# Patient Record
Sex: Female | Born: 1940
Health system: Southern US, Community
[De-identification: ages and names within clinical notes are randomized; demographics above are authoritative.]

## PROBLEM LIST (undated history)

## (undated) DIAGNOSIS — I10 Essential (primary) hypertension: Secondary | ICD-10-CM

## (undated) DIAGNOSIS — G43009 Migraine without aura, not intractable, without status migrainosus: Secondary | ICD-10-CM

## (undated) DIAGNOSIS — M949 Disorder of cartilage, unspecified: Secondary | ICD-10-CM

## (undated) DIAGNOSIS — J309 Allergic rhinitis, unspecified: Secondary | ICD-10-CM

## (undated) DIAGNOSIS — J189 Pneumonia, unspecified organism: Secondary | ICD-10-CM

## (undated) DIAGNOSIS — M519 Unspecified thoracic, thoracolumbar and lumbosacral intervertebral disc disorder: Secondary | ICD-10-CM

## (undated) DIAGNOSIS — Z86010 Personal history of colonic polyps: Secondary | ICD-10-CM

## (undated) DIAGNOSIS — M531 Cervicobrachial syndrome: Secondary | ICD-10-CM

## (undated) DIAGNOSIS — H269 Unspecified cataract: Secondary | ICD-10-CM

## (undated) DIAGNOSIS — F329 Major depressive disorder, single episode, unspecified: Secondary | ICD-10-CM

## (undated) DIAGNOSIS — E785 Hyperlipidemia, unspecified: Secondary | ICD-10-CM

## (undated) DIAGNOSIS — I8390 Asymptomatic varicose veins of unspecified lower extremity: Secondary | ICD-10-CM

## (undated) DIAGNOSIS — D649 Anemia, unspecified: Secondary | ICD-10-CM

## (undated) DIAGNOSIS — M1990 Unspecified osteoarthritis, unspecified site: Secondary | ICD-10-CM

## (undated) DIAGNOSIS — M899 Disorder of bone, unspecified: Secondary | ICD-10-CM

## (undated) DIAGNOSIS — R413 Other amnesia: Secondary | ICD-10-CM

## (undated) DIAGNOSIS — K219 Gastro-esophageal reflux disease without esophagitis: Secondary | ICD-10-CM

## (undated) DIAGNOSIS — C801 Malignant (primary) neoplasm, unspecified: Secondary | ICD-10-CM

## (undated) DIAGNOSIS — G709 Myoneural disorder, unspecified: Secondary | ICD-10-CM

## (undated) DIAGNOSIS — M549 Dorsalgia, unspecified: Secondary | ICD-10-CM

## (undated) HISTORY — DX: Malignant (primary) neoplasm, unspecified: C80.1

## (undated) HISTORY — DX: Unspecified cataract: H26.9

## (undated) HISTORY — DX: Hyperlipidemia, unspecified: E78.5

## (undated) HISTORY — DX: Disorder of bone, unspecified: M89.9

## (undated) HISTORY — DX: Cervicobrachial syndrome: M53.1

## (undated) HISTORY — DX: Other amnesia: R41.3

## (undated) HISTORY — PX: CHOLECYSTECTOMY: SHX55

## (undated) HISTORY — DX: Unspecified osteoarthritis, unspecified site: M19.90

## (undated) HISTORY — DX: Gastro-esophageal reflux disease without esophagitis: K21.9

## (undated) HISTORY — DX: Major depressive disorder, single episode, unspecified: F32.9

## (undated) HISTORY — DX: Migraine without aura, not intractable, without status migrainosus: G43.009

## (undated) HISTORY — DX: Allergic rhinitis, unspecified: J30.9

## (undated) HISTORY — PX: TUBAL LIGATION: SHX77

## (undated) HISTORY — DX: Unspecified thoracic, thoracolumbar and lumbosacral intervertebral disc disorder: M51.9

## (undated) HISTORY — DX: Disorder of cartilage, unspecified: M94.9

## (undated) HISTORY — DX: Personal history of colonic polyps: Z86.010

## (undated) HISTORY — PX: VARICOSE VEIN SURGERY: SHX832

## (undated) HISTORY — PX: DILATION AND CURETTAGE OF UTERUS: SHX78

## (undated) HISTORY — DX: Dorsalgia, unspecified: M54.9

## (undated) HISTORY — PX: COLONOSCOPY: SHX174

## (undated) HISTORY — DX: Essential (primary) hypertension: I10

---

## 1984-07-25 HISTORY — PX: OTHER SURGICAL HISTORY: SHX169

## 1984-07-25 HISTORY — PX: LUMBAR LAMINECTOMY: SHX95

## 1986-07-25 HISTORY — PX: BREAST BIOPSY: SHX20

## 2001-03-08 ENCOUNTER — Encounter: Payer: Self-pay | Admitting: Internal Medicine

## 2001-03-28 ENCOUNTER — Encounter: Payer: Self-pay | Admitting: Internal Medicine

## 2001-04-02 ENCOUNTER — Encounter: Payer: Self-pay | Admitting: Internal Medicine

## 2001-04-12 ENCOUNTER — Encounter: Payer: Self-pay | Admitting: Internal Medicine

## 2001-05-01 ENCOUNTER — Encounter: Payer: Self-pay | Admitting: Internal Medicine

## 2004-07-25 HISTORY — PX: OVARIAN CYST REMOVAL: SHX89

## 2007-07-26 LAB — HM COLONOSCOPY: HM Colonoscopy: ABNORMAL

## 2009-04-24 LAB — CONVERTED CEMR LAB: Pap Smear: NORMAL

## 2009-08-20 ENCOUNTER — Encounter: Payer: Self-pay | Admitting: Internal Medicine

## 2009-09-29 ENCOUNTER — Encounter: Payer: Self-pay | Admitting: Internal Medicine

## 2009-12-23 LAB — HM MAMMOGRAPHY: HM Mammogram: NORMAL

## 2010-02-05 ENCOUNTER — Emergency Department (HOSPITAL_COMMUNITY)
Admission: EM | Admit: 2010-02-05 | Discharge: 2010-02-05 | Payer: Self-pay | Source: Home / Self Care | Admitting: Emergency Medicine

## 2010-02-09 ENCOUNTER — Ambulatory Visit: Payer: Self-pay | Admitting: Internal Medicine

## 2010-02-09 DIAGNOSIS — M549 Dorsalgia, unspecified: Secondary | ICD-10-CM

## 2010-02-09 DIAGNOSIS — M1990 Unspecified osteoarthritis, unspecified site: Secondary | ICD-10-CM | POA: Insufficient documentation

## 2010-02-09 DIAGNOSIS — R5383 Other fatigue: Secondary | ICD-10-CM

## 2010-02-09 DIAGNOSIS — K219 Gastro-esophageal reflux disease without esophagitis: Secondary | ICD-10-CM | POA: Insufficient documentation

## 2010-02-09 DIAGNOSIS — Z860100 Personal history of colon polyps, unspecified: Secondary | ICD-10-CM

## 2010-02-09 DIAGNOSIS — F3289 Other specified depressive episodes: Secondary | ICD-10-CM

## 2010-02-09 DIAGNOSIS — F329 Major depressive disorder, single episode, unspecified: Secondary | ICD-10-CM | POA: Insufficient documentation

## 2010-02-09 DIAGNOSIS — I10 Essential (primary) hypertension: Secondary | ICD-10-CM | POA: Insufficient documentation

## 2010-02-09 DIAGNOSIS — G43009 Migraine without aura, not intractable, without status migrainosus: Secondary | ICD-10-CM | POA: Insufficient documentation

## 2010-02-09 DIAGNOSIS — M899 Disorder of bone, unspecified: Secondary | ICD-10-CM | POA: Insufficient documentation

## 2010-02-09 DIAGNOSIS — M949 Disorder of cartilage, unspecified: Secondary | ICD-10-CM

## 2010-02-09 DIAGNOSIS — E785 Hyperlipidemia, unspecified: Secondary | ICD-10-CM

## 2010-02-09 DIAGNOSIS — R079 Chest pain, unspecified: Secondary | ICD-10-CM | POA: Insufficient documentation

## 2010-02-09 DIAGNOSIS — G8929 Other chronic pain: Secondary | ICD-10-CM | POA: Insufficient documentation

## 2010-02-09 DIAGNOSIS — R5381 Other malaise: Secondary | ICD-10-CM | POA: Insufficient documentation

## 2010-02-09 HISTORY — DX: Dorsalgia, unspecified: M54.9

## 2010-02-09 HISTORY — DX: Disorder of bone, unspecified: M89.9

## 2010-02-09 HISTORY — DX: Personal history of colonic polyps: Z86.010

## 2010-02-09 HISTORY — DX: Hyperlipidemia, unspecified: E78.5

## 2010-02-09 HISTORY — DX: Unspecified osteoarthritis, unspecified site: M19.90

## 2010-02-09 HISTORY — DX: Other specified depressive episodes: F32.89

## 2010-02-09 HISTORY — DX: Migraine without aura, not intractable, without status migrainosus: G43.009

## 2010-02-09 HISTORY — DX: Gastro-esophageal reflux disease without esophagitis: K21.9

## 2010-02-09 HISTORY — DX: Major depressive disorder, single episode, unspecified: F32.9

## 2010-02-09 HISTORY — DX: Essential (primary) hypertension: I10

## 2010-02-09 HISTORY — DX: Personal history of colon polyps, unspecified: Z86.0100

## 2010-02-09 LAB — CONVERTED CEMR LAB
ALT: 22 units/L (ref 0–35)
AST: 19 units/L (ref 0–37)
Albumin: 4.9 g/dL (ref 3.5–5.2)
Alkaline Phosphatase: 69 units/L (ref 39–117)
BUN: 15 mg/dL (ref 6–23)
Basophils Absolute: 0 10*3/uL (ref 0.0–0.1)
Basophils Relative: 0.3 % (ref 0.0–3.0)
Bilirubin Urine: NEGATIVE
Bilirubin, Direct: 0.1 mg/dL (ref 0.0–0.3)
CO2: 31 meq/L (ref 19–32)
Calcium: 9.5 mg/dL (ref 8.4–10.5)
Chloride: 102 meq/L (ref 96–112)
Cholesterol: 201 mg/dL — ABNORMAL HIGH (ref 0–200)
Creatinine, Ser: 0.7 mg/dL (ref 0.4–1.2)
Direct LDL: 122.2 mg/dL
Eosinophils Absolute: 0.1 10*3/uL (ref 0.0–0.7)
Eosinophils Relative: 1.4 % (ref 0.0–5.0)
Folate: >20 ng/mL
GFR calc non Af Amer: 85.16 mL/min (ref >60)
Glucose, Bld: 93 mg/dL (ref 70–99)
HCT: 42.8 % (ref 36.0–46.0)
HDL: 64.9 mg/dL (ref >39.00)
Hemoglobin, Urine: NEGATIVE
Hemoglobin: 14.6 g/dL (ref 12.0–15.0)
Iron: 123 ug/dL (ref 42–145)
Ketones, ur: NEGATIVE mg/dL
Leukocytes, UA: NEGATIVE
Lymphocytes Relative: 30.8 % (ref 12.0–46.0)
Lymphs Abs: 2.1 10*3/uL (ref 0.7–4.0)
MCHC: 34.1 g/dL (ref 30.0–36.0)
MCV: 92.9 fL (ref 78.0–100.0)
Monocytes Absolute: 0.5 10*3/uL (ref 0.1–1.0)
Monocytes Relative: 7.1 % (ref 3.0–12.0)
Neutro Abs: 4 10*3/uL (ref 1.4–7.7)
Neutrophils Relative %: 60.4 % (ref 43.0–77.0)
Nitrite: NEGATIVE
Platelets: 248 10*3/uL (ref 150.0–400.0)
Potassium: 3.9 meq/L (ref 3.5–5.1)
RBC: 4.61 M/uL (ref 3.87–5.11)
RDW: 13.2 % (ref 11.5–14.6)
Saturation Ratios: 32.3 % (ref 20.0–50.0)
Sed Rate: 11 mm/hr (ref 0–22)
Sodium: 139 meq/L (ref 135–145)
Specific Gravity, Urine: 1.01 (ref 1.000–1.030)
TSH: 1.75 microintl units/mL (ref 0.35–5.50)
Total Bilirubin: 0.5 mg/dL (ref 0.3–1.2)
Total CHOL/HDL Ratio: 3
Total Protein, Urine: NEGATIVE mg/dL
Total Protein: 7.4 g/dL (ref 6.0–8.3)
Transferrin: 272.3 mg/dL (ref 212.0–360.0)
Triglycerides: 120 mg/dL (ref 0.0–149.0)
Urine Glucose: NEGATIVE mg/dL
Urobilinogen, UA: 0.2 (ref 0.0–1.0)
VLDL: 24 mg/dL (ref 0.0–40.0)
Vitamin B-12: 441 pg/mL (ref 211–911)
WBC: 6.7 10*3/uL (ref 4.5–10.5)
pH: 6.5 (ref 5.0–8.0)

## 2010-02-10 LAB — CONVERTED CEMR LAB: Vit D, 25-Hydroxy: 51 ng/mL (ref 30–89)

## 2010-02-11 ENCOUNTER — Telehealth: Payer: Self-pay | Admitting: Internal Medicine

## 2010-02-15 ENCOUNTER — Telehealth: Payer: Self-pay | Admitting: Internal Medicine

## 2010-02-18 ENCOUNTER — Encounter (INDEPENDENT_AMBULATORY_CARE_PROVIDER_SITE_OTHER): Payer: Self-pay | Admitting: *Deleted

## 2010-03-22 ENCOUNTER — Ambulatory Visit: Payer: Self-pay | Admitting: Internal Medicine

## 2010-03-22 DIAGNOSIS — L989 Disorder of the skin and subcutaneous tissue, unspecified: Secondary | ICD-10-CM | POA: Insufficient documentation

## 2010-04-06 ENCOUNTER — Encounter
Admission: RE | Admit: 2010-04-06 | Discharge: 2010-07-05 | Payer: Self-pay | Source: Home / Self Care | Attending: Physical Medicine & Rehabilitation | Admitting: Physical Medicine & Rehabilitation

## 2010-04-09 ENCOUNTER — Ambulatory Visit: Payer: Self-pay | Admitting: Physical Medicine & Rehabilitation

## 2010-04-26 ENCOUNTER — Ambulatory Visit: Payer: Self-pay | Admitting: Internal Medicine

## 2010-04-26 ENCOUNTER — Ambulatory Visit: Payer: Self-pay | Admitting: Physical Medicine & Rehabilitation

## 2010-04-26 DIAGNOSIS — R197 Diarrhea, unspecified: Secondary | ICD-10-CM | POA: Insufficient documentation

## 2010-04-26 LAB — CONVERTED CEMR LAB
BUN: 7 mg/dL (ref 6–23)
Basophils Absolute: 0 10*3/uL (ref 0.0–0.1)
Basophils Relative: 0.3 % (ref 0.0–3.0)
CO2: 31 meq/L (ref 19–32)
Calcium: 9.8 mg/dL (ref 8.4–10.5)
Chloride: 94 meq/L — ABNORMAL LOW (ref 96–112)
Creatinine, Ser: 0.8 mg/dL (ref 0.4–1.2)
Eosinophils Absolute: 0.1 10*3/uL (ref 0.0–0.7)
Eosinophils Relative: 1.5 % (ref 0.0–5.0)
GFR calc non Af Amer: 73.24 mL/min (ref >60)
Glucose, Bld: 107 mg/dL — ABNORMAL HIGH (ref 70–99)
HCT: 43.1 % (ref 36.0–46.0)
Hemoglobin: 15.1 g/dL — ABNORMAL HIGH (ref 12.0–15.0)
Lymphocytes Relative: 25.3 % (ref 12.0–46.0)
Lymphs Abs: 1.5 10*3/uL (ref 0.7–4.0)
MCHC: 35 g/dL (ref 30.0–36.0)
MCV: 93.2 fL (ref 78.0–100.0)
Monocytes Absolute: 0.5 10*3/uL (ref 0.1–1.0)
Monocytes Relative: 8.1 % (ref 3.0–12.0)
Neutro Abs: 3.8 10*3/uL (ref 1.4–7.7)
Neutrophils Relative %: 64.8 % (ref 43.0–77.0)
Platelets: 258 10*3/uL (ref 150.0–400.0)
Potassium: 3.2 meq/L — ABNORMAL LOW (ref 3.5–5.1)
RBC: 4.62 M/uL (ref 3.87–5.11)
RDW: 13.5 % (ref 11.5–14.6)
Sodium: 134 meq/L — ABNORMAL LOW (ref 135–145)
WBC: 5.9 10*3/uL (ref 4.5–10.5)

## 2010-05-04 ENCOUNTER — Telehealth: Payer: Self-pay | Admitting: Internal Medicine

## 2010-05-04 ENCOUNTER — Encounter: Payer: Self-pay | Admitting: Internal Medicine

## 2010-05-07 ENCOUNTER — Ambulatory Visit: Payer: Self-pay | Admitting: Gastroenterology

## 2010-05-07 LAB — CONVERTED CEMR LAB
ALT: 20 units/L (ref 0–35)
AST: 20 units/L (ref 0–37)
Albumin: 4.1 g/dL (ref 3.5–5.2)
Alkaline Phosphatase: 64 units/L (ref 39–117)
BUN: 10 mg/dL (ref 6–23)
Basophils Absolute: 0 10*3/uL (ref 0.0–0.1)
Basophils Relative: 0.3 % (ref 0.0–3.0)
CO2: 30 meq/L (ref 19–32)
Calcium: 9.5 mg/dL (ref 8.4–10.5)
Chloride: 103 meq/L (ref 96–112)
Creatinine, Ser: 0.8 mg/dL (ref 0.4–1.2)
Eosinophils Absolute: 0.1 10*3/uL (ref 0.0–0.7)
Eosinophils Relative: 1.3 % (ref 0.0–5.0)
GFR calc non Af Amer: 78.75 mL/min (ref >60)
Glucose, Bld: 89 mg/dL (ref 70–99)
HCT: 40.4 % (ref 36.0–46.0)
Hemoglobin: 13.7 g/dL (ref 12.0–15.0)
IgA: 202 mg/dL (ref 68–378)
Lymphocytes Relative: 19.6 % (ref 12.0–46.0)
Lymphs Abs: 1.1 10*3/uL (ref 0.7–4.0)
MCHC: 33.9 g/dL (ref 30.0–36.0)
MCV: 95 fL (ref 78.0–100.0)
Monocytes Absolute: 0.4 10*3/uL (ref 0.1–1.0)
Monocytes Relative: 7 % (ref 3.0–12.0)
Neutro Abs: 4.1 10*3/uL (ref 1.4–7.7)
Neutrophils Relative %: 71.8 % (ref 43.0–77.0)
Platelets: 240 10*3/uL (ref 150.0–400.0)
Potassium: 4.2 meq/L (ref 3.5–5.1)
RBC: 4.25 M/uL (ref 3.87–5.11)
RDW: 13.8 % (ref 11.5–14.6)
Sed Rate: 12 mm/hr (ref 0–22)
Sodium: 138 meq/L (ref 135–145)
TSH: 1.23 microintl units/mL (ref 0.35–5.50)
Tissue Transglutaminase Ab, IgA: 13.7 units (ref <20)
Total Bilirubin: 0.5 mg/dL (ref 0.3–1.2)
Total Protein: 6.5 g/dL (ref 6.0–8.3)
WBC: 5.7 10*3/uL (ref 4.5–10.5)

## 2010-05-10 ENCOUNTER — Encounter: Payer: Self-pay | Admitting: Gastroenterology

## 2010-05-11 ENCOUNTER — Encounter: Payer: Self-pay | Admitting: Gastroenterology

## 2010-05-11 ENCOUNTER — Encounter: Payer: Self-pay | Admitting: Internal Medicine

## 2010-05-17 ENCOUNTER — Encounter (INDEPENDENT_AMBULATORY_CARE_PROVIDER_SITE_OTHER): Payer: Self-pay | Admitting: *Deleted

## 2010-06-01 ENCOUNTER — Ambulatory Visit: Payer: Self-pay | Admitting: Physical Medicine & Rehabilitation

## 2010-06-03 ENCOUNTER — Encounter: Payer: Self-pay | Admitting: Internal Medicine

## 2010-06-09 ENCOUNTER — Ambulatory Visit: Payer: Self-pay | Admitting: Internal Medicine

## 2010-06-09 DIAGNOSIS — M531 Cervicobrachial syndrome: Secondary | ICD-10-CM | POA: Insufficient documentation

## 2010-06-09 HISTORY — DX: Cervicobrachial syndrome: M53.1

## 2010-06-15 ENCOUNTER — Telehealth (INDEPENDENT_AMBULATORY_CARE_PROVIDER_SITE_OTHER): Payer: Self-pay | Admitting: *Deleted

## 2010-06-16 ENCOUNTER — Encounter: Payer: Self-pay | Admitting: Internal Medicine

## 2010-06-21 ENCOUNTER — Ambulatory Visit: Payer: Self-pay | Admitting: Gastroenterology

## 2010-08-24 NOTE — Consult Note (Signed)
Summary: [facility]   Imported By: Sherian Rein 05/21/2010 14:43:37  _____________________________________________________________________  External Attachment:    Type:   Image     Comment:   External Document

## 2010-08-24 NOTE — Assessment & Plan Note (Signed)
Summary: NEW MEDICARE PT--POST ER: 02/05/10--MUSCLE SPASM-#PKG/OFF--STC   Vital Signs:  Patient profile:   70 year old female Height:      65 inches Weight:      160.25 pounds BMI:     26.76 O2 Sat:      94 % on Room air Temp:     97.8 degrees F oral Pulse rate:   86 / minute BP sitting:   142 / 84  (left arm) Cuff size:   regular  Vitals Entered By: Zella Ball Ewing CMA Duncan Dull) (February 09, 2010 1:14 PM)  O2 Flow:  Room air  Preventive Care Screening  Colonoscopy:    Date:  07/26/2007    Next Due:  07/2017    Results:  abnormal   Mammogram:    Date:  12/23/2009    Results:  normal   Pap Smear:    Date:  04/24/2009    Results:  normal   Bone Density:    Date:  04/24/2008    Results:  abnormal std dev  Last Pneumovax:    Date:  07/25/2005    Results:  given   Last Tetanus Booster:    Date:  01/22/2002    Results:  Historical   CC: New Patient, New Medicare/RE   CC:  New Patient and New Medicare/RE.  History of Present Illness: here to f/u after being seen in the ER july 15 with chest pain;  has recurrent MSK muscle spasms in the chest for years;  had taken her muscle relaxer but did not help and went to ER - ECG ok,  tx medically and asked to find primary MD locally,  just moved here june 13 form [place].  2 children live here .  No further pain since then ; Pt denies CP, sob, doe, wheezing, orthopnea, pnd, worsening LE edema, palps, dizziness or syncope  .Pt denies new neuro symptoms such as headache, facial or extremity weakness  Last stress test approx 4 to 5 yrs ago.   Here for wellness Diet: Heart Healthy or DM if diabetic Physical Activities: swims several times per wk Depression/mood screen: mild to mod, no suicidal ideatin Hearing: Intact bilateral Visual Acuity: Grossly normal, gets exam yearly ADL's: Capable  Fall Risk: None Home Safety: Good Cognitive Impairment:  Gen appearance, affect, speech, memory, attention & motor skills grossly  intact End-of-Life Planning: Advance directive - Full code/I agree   Preventive Screening-Counseling & Management  Alcohol-Tobacco     Smoking Status: quit      Drug Use:  no.    Problems Prior to Update: 1)  Fatigue  (ICD-780.79) 2)  Osteopenia  (ICD-733.90) 3)  Common Migraine  (ICD-346.10) 4)  Colonic Polyps, Hx of  (ICD-V12.72) 5)  Hyperlipidemia  (ICD-272.4) 6)  Gerd  (ICD-530.81) 7)  Depression  (ICD-311) 8)  Degenerative Joint Disease  (ICD-715.90) 9)  Hypertension  (ICD-401.9) 10)  Back Pain, Chronic  (ICD-724.5)  Medications Prior to Update: 1)  None  Current Medications (verified): 1)  Nortriptyline Hcl 25 Mg Caps (Nortriptyline Hcl) .Marland Kitchen.. 1 By Mouth At Bedtime 2)  Calcium 500 Mg Tabs (Calcium) .Marland Kitchen.. 1 By Mouth Two Times A Day 3)  Glucosamine-Chondroitin 500-400 Mg Caps (Glucosamine-Chondroitin) .Marland Kitchen.. 1 By Mouth Two Times A Day 4)  Hydrochlorothiazide 12.5 Mg Caps (Hydrochlorothiazide) .Marland Kitchen.. 1 By Mouth Once Daily 5)  Simvastatin 20 Mg Tabs (Simvastatin) .Marland Kitchen.. 1 By Mouth Once Daily 6)  Carisoprodol 350 Mg Tabs (Carisoprodol) .Marland Kitchen.. 1 By Mouth Two Times A Day As  Needed 7)  Cymbalta 60 Mg Cpep (Duloxetine Hcl) .Marland Kitchen.. 1po Once Daily  Allergies (verified): 1)  ! Pcn 2)  ! 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[employer], also some bookkeeping Married 3 children Former Smoker - started 17yo to 1971 Alcohol use-yes Drug use-no  Risk Factors: Smoking Status: quit (02/09/2010)  Past Medical History: chronic back pain/chronic nerve pain bilat feet since prior to 2002 - neg EMG/NCS Hypertension Depression GERD recurrent headaches/migraine  Hyperlipidemia Colonic polyps, hx of lumbar disc disease MD roster:  neurology - Dr Maryella Shivers  [place]                   GYN - Dr Lanell Matar -  [place] Osteopenia  Past Surgical History: s/p chymopapain injection lumbar 1986 Tubal ligation s/p lumbar laminectomy 1986 s/p ovary cyst 2006 s/p varicose vein removed 2008 Cholecystectomy s/p breast biopsy - neg - 1988  Family History: Reviewed history and no changes required. mother with lung cancer father with DM grandmother with breast cancer  Social History: Reviewed history and no changes required. 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         all otherwise negative per pt -  except for mild ongoing fatigue, on osa symtpoms  Physical Exam  General:  alert and well-developed.   Head:  normocephalic and atraumatic.   Eyes:  vision grossly intact, pupils equal, and pupils round.   Ears:  R ear normal and L ear normal.   Nose:  no external deformity and no nasal discharge.   Mouth:  no gingival abnormalities and pharynx pink and moist.   Neck:  supple and no masses.   Lungs:  normal respiratory effort and normal breath sounds.   Heart:  normal rate and regular rhythm.   Abdomen:  soft, non-tender, and normal bowel sounds.   Msk:  no joint tenderness and no joint swelling.   Extremities:  no edema, no erythema  Neurologic:  cranial nerves II-XII intact and strength normal in all extremities.   Skin:  color normal and no rashes.   Psych:  not depressed appearing and slightly  anxious.     Impression & Recommendations:  Problem # 1:  Preventive Health Care (ICD-V70.0)  Overall doing well, age appropriate education and counseling updated and referral for appropriate preventive services done unless declined, immunizations up to date or declined, diet counseling done if overweight, urged to quit smoking if smokes , most recent labs reviewed and current ordered if appropriate, ecg reviewed or declined (interpretation per ECG scanned in the EMR if done); information regarding Medicare Prevention requirements given if appropriate; speciality referrals updated as appropriate   Orders: First annual wellness visit with prevention plan  (O1308)  Problem # 2:  HYPERLIPIDEMIA (ICD-272.4)  Her updated medication list for this problem includes:    Simvastatin 20 Mg Tabs (Simvastatin) .Marland KitchenMarland KitchenMarland KitchenMarland Kitchen 1  by mouth once daily Continue all previous medications as before this visit,  Pt to continue diet efforts, good med tolerance; to check labs - goal LDL less than 70   Orders: Prescription Created Electronically [phone])  Problem # 3:  COMMON MIGRAINE (ICD-346.10) last flare was jan 2011 - improved after neuro "shots to the back of the head"  - asympt, ok to follow  Problem # 4:  GERD (ICD-530.81) asympt - no tx at this time  Problem # 5:  DEPRESSION (ICD-311)  Her updated medication list for this problem includes:    Nortriptyline Hcl 25 Mg Caps (Nortriptyline hcl) .Marland Kitchen... 1 by mouth at bedtime    Cymbalta 60 Mg Cpep (Duloxetine hcl) .Marland Kitchen... 1po once daily mild ongoing, ok to start  meds/tx- and will try cymbalta trial  Problem # 6:  HYPERTENSION (ICD-401.9)  Her updated medication list for this problem includes:    Hydrochlorothiazide 12.5 Mg Caps (Hydrochlorothiazide) .Marland Kitchen... 1 by mouth once daily  BP today: 142/84 mild elev today, likely situational, ok to follow, continue same treatment - K at home per pt  Orders: TLB-Lipid Panel (80061-LIPID)  Problem # 7:  FATIGUE  (ICD-780.79)  exam benign, to check labs below; follow with expectant management   Orders: T-Vitamin D (25-Hydroxy) (30865-78469) TLB-BMP (Basic Metabolic Panel-BMET) (80048-METABOL) TLB-CBC Platelet - w/Differential (85025-CBCD) TLB-Hepatic/Liver Function Pnl (80076-HEPATIC) TLB-TSH (Thyroid Stimulating Hormone) (84443-TSH) TLB-Sedimentation Rate (ESR) (85652-ESR) TLB-IBC Pnl (Iron/FE;Transferrin) (83550-IBC) TLB-B12 + Folate Pnl (62952_84132-G40/NUU) TLB-Udip ONLY (81003-UDIP)  Complete Medication List: 1)  Nortriptyline Hcl 25 Mg Caps (Nortriptyline hcl) .Marland Kitchen.. 1 by mouth at bedtime 2)  Calcium 500 Mg Tabs (Calcium) .Marland Kitchen.. 1 by mouth two times a day 3)  Glucosamine-chondroitin 500-400 Mg Caps (Glucosamine-chondroitin) .Marland Kitchen.. 1 by mouth two times a day 4)  Hydrochlorothiazide 12.5 Mg Caps (Hydrochlorothiazide) .Marland Kitchen.. 1 by mouth once daily 5)  Simvastatin 20 Mg Tabs (Simvastatin) .Marland Kitchen.. 1 by mouth once daily 6)  Carisoprodol 350 Mg Tabs (Carisoprodol) .Marland Kitchen.. 1 by mouth two times a day as needed 7)  Cymbalta 60 Mg Cpep (Duloxetine hcl) .Marland Kitchen.. 1po once daily  Patient Instructions: 1)  Please take all new medications as prescribed  - the cymbalta is started at 30 mg per day for one wk, then 60 mg per day after that 2)  Continue all previous medications as before this visit , except aft er3 to 4 wks, you may be able to stop the nortryptilene 3)  Please go to the Lab in the basement for your blood and/or urine tests today 4)  Please schedule a follow-up appointment in 6 months or sooner if needed Prescriptions: CYMBALTA 60 MG CPEP (DULOXETINE HCL) 1po once daily  #30 x 11   Entered and Authorized by:   Corwin Levins MD   Signed by:   Corwin Levins MD on 02/09/2010   Method used:   Print then Give to Patient   RxID:   [id number] CARISOPRODOL 350 MG TABS (CARISOPRODOL) 1 by mouth two times a day as needed  #60 x 5   Entered and Authorized by:   Corwin Levins MD   Signed by:   Corwin Levins MD on  02/09/2010   Method used:   Print then Give to Patient   RxID:   [id number] SIMVASTATIN 20 MG TABS (SIMVASTATIN) 1 by mouth once daily  #90 x 3   Entered and Authorized by:   Corwin Levins MD   Signed by:   Corwin Levins MD on  02/09/2010   Method used:   Print then Give to Patient   RxID:   [id number] HYDROCHLOROTHIAZIDE 12.5 MG CAPS (HYDROCHLOROTHIAZIDE) 1 by mouth once daily  #90 x 3   Entered and Authorized by:   Corwin Levins MD   Signed by:   Corwin Levins MD on 02/09/2010   Method used:   Print then Give to Patient   RxID:   [id number] NORTRIPTYLINE HCL 25 MG CAPS (NORTRIPTYLINE HCL) 1 by mouth at bedtime  #90 x 3   Entered and Authorized by:   Corwin Levins MD   Signed by:   Corwin Levins MD on 02/09/2010   Method used:   Print then Give to Patient   RxID:   [id number]    Immunization History:  Tetanus/Td Immunization History:    Tetanus/Td:  historical (01/22/2002)

## 2010-08-24 NOTE — Medication Information (Signed)
Summary: Approved/[facility]  Approved/[facility]   Imported By: Lester Alapaha 06/24/2010 07:55:27  _____________________________________________________________________  External Attachment:    Type:   Image     Comment:   External Document

## 2010-08-24 NOTE — Assessment & Plan Note (Signed)
  Review of gastrointestinal problems: 1. Acute diarrheal illness, likely infectious.  Symptoms improved over about 4 weeks. October 2011. CBC, complete metabolic profile, celiac sprue testing, sedimentation rate all negative. Stool tests for usual infectious etiologies were all negative. Colonoscopy out of state in [place] 2-3 years prior was normal per patient    History of Present Illness Visit Type: Follow-up Visit Primary GI MD: Rob Bunting MD Primary Provider: Oliver Barre, MD Requesting Provider: na Chief Complaint: F/u visit  History of Present Illness:     very pleasant 70 year old woman whom I last saw about 6 weeks ago.  took about 4 weeks to settle down loose stools. Past 2 weeks much more normal.  Still slightly loose, but urgency is gone.  She has been taking immodium sporadically (has taken 2 immodium in past 2 weeks).  No bleeding.  Abd feels fine.             Current Medications (verified): 1)  Calcium 500 Mg Tabs (Calcium) .Marland Kitchen.. 1 By Mouth Two Times A Day 2)  Glucosamine-Chondroitin 500-400 Mg Caps (Glucosamine-Chondroitin) .Marland Kitchen.. 1 By Mouth Two Times A Day 3)  Hydrochlorothiazide 12.5 Mg Caps (Hydrochlorothiazide) .Marland Kitchen.. 1 By Mouth Once Daily 4)  Simvastatin 20 Mg Tabs (Simvastatin) .Marland Kitchen.. 1 By Mouth Once Daily 5)  Carisoprodol 350 Mg Tabs (Carisoprodol) .Marland Kitchen.. 1 By Mouth Two Times A Day As Needed 6)  Cymbalta 60 Mg Cpep (Duloxetine Hcl) .Marland Kitchen.. 1po Once Daily 7)  Butrans 5 Mcg/hr Ptwk (Buprenorphine) .Marland Kitchen.. 1 Patch Use Asd Per Wk(Not Started Yet) 8)  Tylenol 325 Mg Tabs (Acetaminophen) .... As Needed  Allergies (verified): 1)  ! Pcn 2)  ! 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 Stool testing and blood tests were all negative, see above. Her symptoms improved gradually over 4-5 weeks and are now nearly normal. I see no reason for any further testing unless her symptoms recur. We will try again to get her previous colonoscopy report from [place] sent over.  Patient Instructions: 1)  Reintroduce dairy products into your diet slowly. 2)  We will try again to get records from your previous colonoscopy [place], [place]). 3)  Call if diarrhea returns. 4)  A copy of this information will be sent to Dr. Jonny Ruiz. 5)  The medication list was reviewed and reconciled.  All changed / newly prescribed medications were explained.  A complete medication list was provided to the patient / caregiver.

## 2010-08-24 NOTE — Letter (Signed)
Summary: [facility]   Imported By: Lennie Odor 06/08/2010 13:20:24  _____________________________________________________________________  External Attachment:    Type:   Image     Comment:   External Document

## 2010-08-24 NOTE — Assessment & Plan Note (Signed)
Summary: HEADACHES/NWS  #   Vital Signs:  Patient profile:   70 year old female Height:      65 inches Weight:      154 pounds BMI:     25.72 O2 Sat:      94 % on Room air Temp:     98.3 degrees F oral Pulse rate:   76 / minute BP sitting:   132 / 80  (left arm) Cuff size:   regular  Vitals Entered By: Zella Ball Ewing CMA Duncan Dull) (June 09, 2010 11:26 AM)  O2 Flow:  Room air  CC: Headaches, Flu shot/RE   Primary Care Provider:  Oliver Barre, MD  CC:  Headaches and Flu shot/RE.  History of Present Illness: here with c/o pain c/w her  right occipital neuralgia dx per neurology 2 wks ago, s/p failed nov 1 cortisone per pt; whereas the prior 2 shots did seem to help; no other med tx other than this;  not o/w seeing pain clinic,  pain increased in the last wk, hard to sleep;  now 7/10, constant, no radiation, not amenalbe to OTC meds such as tylenol, and not particularly aggrevated by anything, including neck flexion.  No other UE or LE pain/weak/numb, no bowel or bladder change, gait change or fall or injury.  No fever, wt loss, Pt denies CP, worsening sob, doe, wheezing, orthopnea, pnd, worsening LE edema, palps, dizziness or syncope  Pt denies other new neuro symptoms such as headache, facial or extremity weakness  .  Pt denies polydipsia, polyuria.  Overall good compliance with meds, and good tolerability. , including the cymbalta.  Denies worsening depressive symptoms, suicidal ideation, or panic.    Problems Prior to Update: 1)  Diarrhea  (ICD-787.91) 2)  Diarrhea, Acute  (ICD-787.91) 3)  Skin Lesion  (ICD-709.9) 4)  Preventive Health Care  (ICD-V70.0) 5)  Fatigue  (ICD-780.79) 6)  Osteopenia  (ICD-733.90) 7)  Common Migraine  (ICD-346.10) 8)  Colonic Polyps, Hx of  (ICD-V12.72) 9)  Hyperlipidemia  (ICD-272.4) 10)  Gerd  (ICD-530.81) 11)  Depression  (ICD-311) 12)  Degenerative Joint Disease  (ICD-715.90) 13)  Hypertension  (ICD-401.9) 14)  Back Pain, Chronic   (ICD-724.5)  Medications Prior to Update: 1)  Calcium 500 Mg Tabs (Calcium) .Marland Kitchen.. 1 By Mouth Two Times A Day 2)  Glucosamine-Chondroitin 500-400 Mg Caps (Glucosamine-Chondroitin) .Marland Kitchen.. 1 By Mouth Two Times A Day 3)  Hydrochlorothiazide 12.5 Mg Caps (Hydrochlorothiazide) .Marland Kitchen.. 1 By Mouth Once Daily 4)  Simvastatin 20 Mg Tabs (Simvastatin) .Marland Kitchen.. 1 By Mouth Once Daily 5)  Carisoprodol 350 Mg Tabs (Carisoprodol) .Marland Kitchen.. 1 By Mouth Two Times A Day As Needed 6)  Cymbalta 60 Mg Cpep (Duloxetine Hcl) .Marland Kitchen.. 1po Once Daily  Current Medications (verified): 1)  Calcium 500 Mg Tabs (Calcium) .Marland Kitchen.. 1 By Mouth Two Times A Day 2)  Glucosamine-Chondroitin 500-400 Mg Caps (Glucosamine-Chondroitin) .Marland Kitchen.. 1 By Mouth Two Times A Day 3)  Hydrochlorothiazide 12.5 Mg Caps (Hydrochlorothiazide) .Marland Kitchen.. 1 By Mouth Once Daily 4)  Simvastatin 20 Mg Tabs (Simvastatin) .Marland Kitchen.. 1 By Mouth Once Daily 5)  Carisoprodol 350 Mg Tabs (Carisoprodol) .Marland Kitchen.. 1 By Mouth Two Times A Day As Needed 6)  Cymbalta 60 Mg Cpep (Duloxetine Hcl) .Marland Kitchen.. 1po Once Daily 7)  Butrans 5 Mcg/hr Ptwk (Buprenorphine) .Marland Kitchen.. 1 Patch Use Asd Per Wk  Allergies (verified): 1)  ! Pcn 2)  ! Sulfa  Past History:  Past Medical History: Last updated: 05/07/2010 chronic back pain/chronic nerve pain bilat feet since prior  to 2002 - neg EMG/NCS Hypertension Depression GERD recurrent headaches/migraine  Hyperlipidemia Colonic polyps, ( unclear pathology, procedures done in [place]) lumbar disc disease MD roster:  neurology - Dr Maryella Shivers  - [place]                   GYN - Dr Lanell Matar - [place] Osteopenia     Past Surgical History: Last updated: 05/07/2010 s/p chymopapain injection lumbar 1986 Tubal ligation s/p lumbar laminectomy 1986 s/p ovary cyst 2006 s/p varicose vein removed 2008 Cholecystectomy s/p breast biopsy - neg - 1988   Social History: Last updated: 05/07/2010 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   Head:  normocephalic and atraumatic.   Eyes:  vision grossly intact, pupils equal, and pupils round.   Ears:  R ear normal and L ear normal.   Nose:  no external deformity and no nasal discharge.   Mouth:  no gingival abnormalities and pharynx pink and moist.   Neck:  supple and no masses. , FROM, NT Lungs:  normal respiratory effort and normal breath sounds.   Heart:  normal rate and regular rhythm.   Msk:  no joint tenderness and no joint swelling.   Extremities:  no edema, no erythema  Neurologic:  strength normal in all extremities and gait normal.   Psych:  not anxious appearing and not depressed appearing.     Impression & Recommendations:  Problem # 1:  OCCIPITAL NEURALGIA (ICD-723.8) for butrans tx for uncontrolled pain, and f/u for next cortisone shot and neurology as planned  Problem # 2:  HYPERTENSION (ICD-401.9)  Her updated medication list for this problem includes:    Hydrochlorothiazide 12.5 Mg Caps (Hydrochlorothiazide) .Marland Kitchen... 1 by mouth once daily  BP today: 132/80 Prior BP: 126/78 (05/07/2010)  Labs Reviewed: K+: 4.2 (05/07/2010) Creat: : 0.8 (05/07/2010)   Chol: 201 (02/09/2010)   HDL: 64.90 (02/09/2010)   TG: 120.0 (02/09/2010) stable overall by hx and exam, ok to continue meds/tx as is   Problem # 3:  DEPRESSION (ICD-311)  Her updated medication list for this problem includes:    Cymbalta 60 Mg Cpep (Duloxetine hcl) .Marland Kitchen... 1po once daily stable overall by hx and exam, ok to continue meds/tx as is   Complete Medication List: 1)  Calcium 500 Mg Tabs (Calcium) .Marland Kitchen.. 1 by mouth two times a day 2)  Glucosamine-chondroitin 500-400 Mg Caps (Glucosamine-chondroitin) .Marland Kitchen.. 1 by mouth two times a day 3)   Hydrochlorothiazide 12.5 Mg Caps (Hydrochlorothiazide) .Marland Kitchen.. 1 by mouth once daily 4)  Simvastatin 20 Mg Tabs (Simvastatin) .Marland Kitchen.. 1 by mouth once daily 5)  Carisoprodol 350 Mg Tabs (Carisoprodol) .Marland Kitchen.. 1 by mouth two times a day as needed 6)  Cymbalta 60 Mg Cpep (Duloxetine hcl) .Marland Kitchen.. 1po once daily 7)  Butrans 5 Mcg/hr Ptwk (Buprenorphine) .Marland Kitchen.. 1 patch use asd per wk  Other Orders: Flu Vaccine 33yrs + MEDICARE PATIENTS (C3762) Administration Flu vaccine - MCR (G3151)  Patient Instructions: 1)  you had the flu shot today 2)  Please take all new medications as prescribed  3)  Continue all previous medications as before this visit 4)  Please keep your appt for the future  cortisone shot 5)  Please schedule a follow-up appointment in 6 months or as needed Prescriptions: BUTRANS 5 MCG/HR PTWK (BUPRENORPHINE) 1 patch use asd per wk  #4 x 1   Entered and Authorized by:   Corwin Levins MD   Signed by:   Corwin Levins MD on 06/09/2010   Method used:   Print then Give to Patient   RxID:   [id number]    Orders Added: 1)  Flu Vaccine 46yrs + MEDICARE PATIENTS [Q2039] 2)  Administration Flu vaccine - MCR [G0008] 3)  Est. Patient Level IV [14782]   Flu Vaccine Consent Questions     Do you have a history of severe allergic reactions to this vaccine? no    Any prior history of allergic reactions to egg and/or gelatin? no    Do you have a sensitivity to the preservative Thimersol? no    Do you have a past history of Guillan-Barre Syndrome? no    Do you currently have an acute febrile illness? no    Have you ever had a severe reaction to latex? no    Vaccine information given and explained to patient? yes    Are you currently pregnant? no    Lot Number:[id number]   Exp Date:01/22/2011   Site Given  Left Deltoid IMdflu1 .lnmedflu1

## 2010-08-24 NOTE — Letter (Signed)
Summary: [facility]  [facility]   Imported By: Sherian Rein 02/10/2010 14:22:33  _____________________________________________________________________  External Attachment:    Type:   Image     Comment:   External Document

## 2010-08-24 NOTE — Progress Notes (Signed)
Summary: Referral  Phone Note Call from Patient Call back at Home Phone [phone]   Caller: Patient Summary of Call: Pt called stating that she still has diarrhea. Pt is requesting referral to GI. Initial call taken by: Margaret Pyle, CMA,  May 04, 2010 11:08 AM  Follow-up for Phone Call        ok for referral  Follow-up by: Corwin Levins MD,  May 04, 2010 1:14 PM  Additional Follow-up for Phone Call Additional follow up Details #1::        Pt's spouse informed Additional Follow-up by: Margaret Pyle, CMA,  May 04, 2010 1:39 PM  New Problems: DIARRHEA (ICD-787.91)   New Problems: DIARRHEA (ICD-787.91)

## 2010-08-24 NOTE — Letter (Signed)
Summary: Appt Reminder 2  [facility]  [address] [place], [place] [address]   Phone: [phone]  Fax: [phone]        May 17, 2010 MRN: [id number]    [name] [address] [place], [place]  [address]    Dear Ms. Rueth,   You have a return appointment with Dr. Christella Hartigan on 06/21/10 at 3:45 pm.  Please remember to bring a complete list of the medicines you are taking, your insurance card and your co-pay.  If you have to cancel or reschedule this appointment, please call before 5:00 pm the evening before to avoid a cancellation fee.  If you have any questions or concerns, please call [phone].    Sincerely,    Chales Abrahams CMA (AAMA)  Appended Document: Appt Reminder 2 letter mailed

## 2010-08-24 NOTE — Progress Notes (Signed)
Summary: PA Butrans  Phone Note From Pharmacy   Summary of Call: PA [facility] called [employer] [phone], form sent to Dr Lottie Mussel  June 15, 2010 3:19 PM PA approved through 06/17/11, pt aware . Initial call taken by: Dagoberto Reef,  June 22, 2010 8:43 AM

## 2010-08-24 NOTE — Letter (Signed)
Summary: Maple Mirza MD  Maple Mirza MD   Imported By: Sherian Rein 02/10/2010 14:17:48  _____________________________________________________________________  External Attachment:    Type:   Image     Comment:   External Document

## 2010-08-24 NOTE — Assessment & Plan Note (Signed)
Summary: diarrhea/cd   Vital Signs:  Patient profile:   70 year old female Height:      65 inches Weight:      156.50 pounds BMI:     26.14 O2 Sat:      93 % on Room air Temp:     99 degrees F oral Pulse rate:   90 / minute BP sitting:   124 / 82  (left arm) Cuff size:   regular  Vitals Entered By: Zella Ball Ewing CMA (AAMA) (April 26, 2010 10:45 AM)  O2 Flow:  Room air CC: Diarrhea for 6 days/RE   CC:  Diarrhea for 6 days/RE.  History of Present Illness: here with acute onset x 6 days loose stool, occasionally watery, without fever, abd pain, n/v, blood.  Thinks she has lost about 5 lbs,  and freq and severity may be starting to improve.  Immodium helps somewhat, but not as well as needed.  Has not been able to leave the house for excercise in the past few days. No chills.  No sick contacts or prior hx recent.  No recent antibx.  Similar episode occurred about 10 yrs ago, resolved on its own.  Has some dry thraot, but thinks this is chronic due to  her meds, adn denies orthostasis.  Pt denies CP, worsening sob, doe, wheezing, orthopnea, pnd, worsening LE edema, palps, dizziness or syncope  Pt denies new neuro symptoms such as headache, facial or extremity weakness  Appettit down as well, eating OK the BRAT diet.   Problems Prior to Update: 1)  Diarrhea, Acute  (ICD-787.91) 2)  Skin Lesion  (ICD-709.9) 3)  Preventive Health Care  (ICD-V70.0) 4)  Fatigue  (ICD-780.79) 5)  Osteopenia  (ICD-733.90) 6)  Common Migraine  (ICD-346.10) 7)  Colonic Polyps, Hx of  (ICD-V12.72) 8)  Hyperlipidemia  (ICD-272.4) 9)  Gerd  (ICD-530.81) 10)  Depression  (ICD-311) 11)  Degenerative Joint Disease  (ICD-715.90) 12)  Hypertension  (ICD-401.9) 13)  Back Pain, Chronic  (ICD-724.5)  Medications Prior to Update: 1)  Nortriptyline Hcl 25 Mg Caps (Nortriptyline Hcl) .Marland Kitchen.. 1 By Mouth At Bedtime 2)  Calcium 500 Mg Tabs (Calcium) .Marland Kitchen.. 1 By Mouth Two Times A Day 3)  Glucosamine-Chondroitin 500-400 Mg  Caps (Glucosamine-Chondroitin) .Marland Kitchen.. 1 By Mouth Two Times A Day 4)  Hydrochlorothiazide 12.5 Mg Caps (Hydrochlorothiazide) .Marland Kitchen.. 1 By Mouth Once Daily 5)  Simvastatin 20 Mg Tabs (Simvastatin) .Marland Kitchen.. 1 By Mouth Once Daily 6)  Carisoprodol 350 Mg Tabs (Carisoprodol) .Marland Kitchen.. 1 By Mouth Two Times A Day As Needed 7)  Cymbalta 60 Mg Cpep (Duloxetine Hcl) .Marland Kitchen.. 1po Once Daily 8)  Triamcinolone Acetonide 0.1 % Crea (Triamcinolone Acetonide) .... Use Asd Two Times A Day As Needed  Current Medications (verified): 1)  Nortriptyline Hcl 25 Mg Caps (Nortriptyline Hcl) .Marland Kitchen.. 1 By Mouth At Bedtime 2)  Calcium 500 Mg Tabs (Calcium) .Marland Kitchen.. 1 By Mouth Two Times A Day 3)  Glucosamine-Chondroitin 500-400 Mg Caps (Glucosamine-Chondroitin) .Marland Kitchen.. 1 By Mouth Two Times A Day 4)  Hydrochlorothiazide 12.5 Mg Caps (Hydrochlorothiazide) .Marland Kitchen.. 1 By Mouth Once Daily 5)  Simvastatin 20 Mg Tabs (Simvastatin) .Marland Kitchen.. 1 By Mouth Once Daily 6)  Carisoprodol 350 Mg Tabs (Carisoprodol) .Marland Kitchen.. 1 By Mouth Two Times A Day As Needed 7)  Cymbalta 60 Mg Cpep (Duloxetine Hcl) .Marland Kitchen.. 1po Once Daily 8)  Triamcinolone Acetonide 0.1 % Crea (Triamcinolone Acetonide) .... Use Asd Two Times A Day As Needed 9)  Lomotil 2.5-0.025 Mg Tabs (Diphenoxylate-Atropine) .Marland KitchenMarland KitchenMarland Kitchen  1po As Needed Loose Stool - Max 8 Tabs Per Day  Allergies (verified): 1)  ! Pcn 2)  ! Sulfa  Past History:  Past Medical History: Last updated: 02/09/2010 chronic back pain/chronic nerve pain bilat feet since prior to 2002 - neg EMG/NCS Hypertension Depression GERD recurrent headaches/migraine  Hyperlipidemia Colonic polyps, hx of lumbar disc disease MD roster:  neurology - Dr Maryella Shivers  [place]                   GYN - Dr Lanell Matar - [place] Osteopenia  Past Surgical History: Last updated: 02/09/2010 s/p chymopapain injection lumbar 1986 Tubal ligation s/p lumbar laminectomy 1986 s/p ovary cyst 2006 s/p varicose vein removed 2008 Cholecystectomy s/p breast biopsy - neg -  1988  Social History: Last updated: 02/09/2010 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   Head:  normocephalic and atraumatic.   Eyes:  vision grossly intact, pupils equal, and pupils round.   Ears:  R ear normal and L ear normal.   Nose:  no external deformity and no nasal discharge.   Mouth:  no gingival abnormalities and pharynx pink and moist.   Neck:  supple and no masses.   Lungs:  normal respiratory effort and normal breath sounds.   Heart:  normal rate and regular rhythm.   Abdomen:  soft, non-tender, and normal bowel sounds.   Msk:  no joint tenderness and no joint swelling.   Extremities:  no edema, no erythema  Skin:  color normal and no rashes.     Impression & Recommendations:  Problem # 1:  DIARRHEA, ACUTE (ICD-787.91)  Orders: TLB-CBC Platelet - w/Differential (85025-CBCD) TLB-BMP (Basic Metabolic Panel-BMET) (80048-METABOL)  Her updated medication list for this problem includes:    Lomotil 2.5-0.025 Mg Tabs (Diphenoxylate-atropine) .Marland Kitchen... 1po as needed loose stool - max 8 tabs per day acute onset, ? viral, exam essentially benign, will check cbc, bmet to assess overall wbc and K, renal fxn, but suspect prob viral and liekly to improve in the next few days;  to cont diet/plenty of fluids  Problem # 2:  HYPERTENSION (ICD-401.9)  Her updated medication list for this problem includes:    Hydrochlorothiazide 12.5 Mg Caps (Hydrochlorothiazide) .Marland Kitchen... 1 by mouth once daily  BP today: 124/82 Prior BP: 122/82 (03/22/2010)  Labs Reviewed: K+: 3.9 (02/09/2010) Creat: : 0.7 (02/09/2010)   Chol: 201 (02/09/2010)   HDL: 64.90 (02/09/2010)   TG: 120.0 (02/09/2010) stable overall by hx and  exam, ok to continue meds/tx as is   Complete Medication List: 1)  Nortriptyline Hcl 25 Mg Caps (Nortriptyline hcl) .Marland Kitchen.. 1 by mouth at bedtime 2)  Calcium 500 Mg Tabs (Calcium) .Marland Kitchen.. 1 by mouth two times a day 3)  Glucosamine-chondroitin 500-400 Mg Caps (Glucosamine-chondroitin) .Marland Kitchen.. 1 by mouth two times a day 4)  Hydrochlorothiazide 12.5 Mg Caps (Hydrochlorothiazide) .Marland Kitchen.. 1 by mouth once daily 5)  Simvastatin 20 Mg Tabs (Simvastatin) .Marland Kitchen.. 1 by mouth once daily 6)  Carisoprodol 350 Mg Tabs (Carisoprodol) .Marland Kitchen.. 1 by mouth two times a day as needed 7)  Cymbalta 60 Mg Cpep (Duloxetine hcl) .Marland Kitchen.. 1po once daily 8)  Triamcinolone Acetonide 0.1 % Crea (Triamcinolone acetonide) .Marland KitchenMarland KitchenMarland Kitchen  Use asd two times a day as needed 9)  Lomotil 2.5-0.025 Mg Tabs (Diphenoxylate-atropine) .Marland Kitchen.. 1po as needed loose stool - max 8 tabs per day 10)  Klor-con 10 10 Meq Cr-tabs (Potassium chloride) .Marland Kitchen.. 1po once daily foir 5 days  Patient Instructions: 1)  Please take all new medications as prescribed 2)  Continue all previous medications as before this visit  3)  Please go to the Lab in the basement for your blood and/or urine tests today 4)  Please call the number on the [facility] Card for results of your testing  5)  Please schedule a follow-up appointment as needed. Prescriptions: LOMOTIL 2.5-0.025 MG TABS (DIPHENOXYLATE-ATROPINE) 1po as needed loose stool - max 8 tabs per day  #40 x 1   Entered and Authorized by:   Corwin Levins MD   Signed by:   Corwin Levins MD on 04/26/2010   Method used:   Print then Give to Patient   RxID:   [id number]

## 2010-08-24 NOTE — Progress Notes (Signed)
Summary: Referral  Phone Note Call from Patient Call back at Home Phone [phone]   Caller: Patient Summary of Call: Pt called stating that she has increased pain and numbness in her back. Pt is requesting referral to pain management. Initial call taken by: Margaret Pyle, CMA,  February 15, 2010 2:38 PM  Follow-up for Phone Call        ok for referral - I will do Follow-up by: Corwin Levins MD,  February 15, 2010 5:04 PM  Additional Follow-up for Phone Call Additional follow up Details #1::        Pt advise of referral (can take up to 2 month for appt) and will expect call from [facility] or [facility] Additional Follow-up by: Margaret Pyle, CMA,  February 16, 2010 8:13 AM

## 2010-08-24 NOTE — Letter (Signed)
Summary: [facility]  [facility]   Imported By: Lennie Odor 05/10/2010 15:50:46  _____________________________________________________________________  External Attachment:    Type:   Image     Comment:   External Document

## 2010-08-24 NOTE — Assessment & Plan Note (Signed)
Summary: raised red skintag/cd   Vital Signs:  Patient profile:   70 year old female Height:      65 inches Weight:      160.13 pounds BMI:     26.74 O2 Sat:      95 % on Room air Temp:     99.2 degrees F oral Pulse rate:   77 / minute BP sitting:   122 / 82  (left arm) Cuff size:   regular  Vitals Entered By: Zella Ball Ewing CMA Duncan Dull) (March 22, 2010 10:40 AM)  O2 Flow:  Room air CC: Skin tag, right ear ache, headaches/RE   CC:  Skin tag, right ear ache, and headaches/RE.  History of Present Illness: here with 3 days onset redness and irriation to a skin tag left max sinus area appro 1 cm from the eyelid;  Pt denies CP, worsening sob, doe, wheezing, orthopnea, pnd, worsening LE edema, palps, dizziness or syncope  Pt denies new neuro symptoms such as headache, facial or extremity weakness .  Depressive symptoms overall improved,  no suicidal ideation, no panic or increased anxiety.  Overall good med compliacne, and good tolerance.    Problems Prior to Update: 1)  Skin Lesion  (ICD-709.9) 2)  Preventive Health Care  (ICD-V70.0) 3)  Fatigue  (ICD-780.79) 4)  Osteopenia  (ICD-733.90) 5)  Common Migraine  (ICD-346.10) 6)  Colonic Polyps, Hx of  (ICD-V12.72) 7)  Hyperlipidemia  (ICD-272.4) 8)  Gerd  (ICD-530.81) 9)  Depression  (ICD-311) 10)  Degenerative Joint Disease  (ICD-715.90) 11)  Hypertension  (ICD-401.9) 12)  Back Pain, Chronic  (ICD-724.5)  Medications Prior to Update: 1)  Nortriptyline Hcl 25 Mg Caps (Nortriptyline Hcl) .Marland Kitchen.. 1 By Mouth At Bedtime 2)  Calcium 500 Mg Tabs (Calcium) .Marland Kitchen.. 1 By Mouth Two Times A Day 3)  Glucosamine-Chondroitin 500-400 Mg Caps (Glucosamine-Chondroitin) .Marland Kitchen.. 1 By Mouth Two Times A Day 4)  Hydrochlorothiazide 12.5 Mg Caps (Hydrochlorothiazide) .Marland Kitchen.. 1 By Mouth Once Daily 5)  Simvastatin 20 Mg Tabs (Simvastatin) .Marland Kitchen.. 1 By Mouth Once Daily 6)  Carisoprodol 350 Mg Tabs (Carisoprodol) .Marland Kitchen.. 1 By Mouth Two Times A Day As Needed 7)  Cymbalta 60 Mg  Cpep (Duloxetine Hcl) .Marland Kitchen.. 1po Once Daily  Current Medications (verified): 1)  Nortriptyline Hcl 25 Mg Caps (Nortriptyline Hcl) .Marland Kitchen.. 1 By Mouth At Bedtime 2)  Calcium 500 Mg Tabs (Calcium) .Marland Kitchen.. 1 By Mouth Two Times A Day 3)  Glucosamine-Chondroitin 500-400 Mg Caps (Glucosamine-Chondroitin) .Marland Kitchen.. 1 By Mouth Two Times A Day 4)  Hydrochlorothiazide 12.5 Mg Caps (Hydrochlorothiazide) .Marland Kitchen.. 1 By Mouth Once Daily 5)  Simvastatin 20 Mg Tabs (Simvastatin) .Marland Kitchen.. 1 By Mouth Once Daily 6)  Carisoprodol 350 Mg Tabs (Carisoprodol) .Marland Kitchen.. 1 By Mouth Two Times A Day As Needed 7)  Cymbalta 60 Mg Cpep (Duloxetine Hcl) .Marland Kitchen.. 1po Once Daily 8)  Triamcinolone Acetonide 0.1 % Crea (Triamcinolone Acetonide) .... Use Asd Two Times A Day As Needed  Allergies (verified): 1)  ! Pcn 2)  ! Sulfa  Past History:  Past Medical History: Last updated: 02/09/2010 chronic back pain/chronic nerve pain bilat feet since prior to 2002 - neg EMG/NCS Hypertension Depression GERD recurrent headaches/migraine  Hyperlipidemia Colonic polyps, hx of lumbar disc disease MD roster:  neurology - Dr Maryella Shivers  - [place]                   GYN - Dr Lanell Matar - [place] Osteopenia  Past Surgical History: Last updated: 02/09/2010 s/p chymopapain injection  lumbar 1986 Tubal ligation s/p lumbar laminectomy 1986 s/p ovary cyst 2006 s/p varicose vein removed 2008 Cholecystectomy s/p breast biopsy - neg - 1988  Social History: Last updated: 02/09/2010 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     Impression & Recommendations:  Problem # 1:  SKIN LESION  (ICD-709.9) irritated skin tag by exam, ok for triam cr as needed, and refer to derm per pt request Orders: Dermatology Referral (Derma)  Problem # 2:  HYPERTENSION (ICD-401.9)  Her updated medication list for this problem includes:    Hydrochlorothiazide 12.5 Mg Caps (Hydrochlorothiazide) .Marland Kitchen... 1 by mouth once daily  BP today: 122/82 Prior BP: 142/84 (02/09/2010)  Labs Reviewed: K+: 3.9 (02/09/2010) Creat: : 0.7 (02/09/2010)   Chol: 201 (02/09/2010)   HDL: 64.90 (02/09/2010)   TG: 120.0 (02/09/2010) stable overall by hx and exam, ok to continue meds/tx as is   Problem # 3:  DEPRESSION (ICD-311)  Her updated medication list for this problem includes:    Nortriptyline Hcl 25 Mg Caps (Nortriptyline hcl) .Marland Kitchen... 1 by mouth at bedtime    Cymbalta 60 Mg Cpep (Duloxetine hcl) .Marland Kitchen... 1po once daily improved - Continue all previous medications as before this visit   Complete Medication List: 1)  Nortriptyline Hcl 25 Mg Caps (Nortriptyline hcl) .Marland Kitchen.. 1 by mouth at bedtime 2)  Calcium 500 Mg Tabs (Calcium) .Marland Kitchen.. 1 by mouth two times a day 3)  Glucosamine-chondroitin 500-400 Mg Caps (Glucosamine-chondroitin) .Marland Kitchen.. 1 by mouth two times a day 4)  Hydrochlorothiazide 12.5 Mg Caps (Hydrochlorothiazide) .Marland Kitchen.. 1 by mouth once daily 5)  Simvastatin 20 Mg Tabs (Simvastatin) .Marland Kitchen.. 1 by mouth once daily 6)  Carisoprodol 350 Mg Tabs (Carisoprodol) .Marland Kitchen.. 1 by mouth two times a day as needed 7)  Cymbalta 60 Mg Cpep (Duloxetine hcl) .Marland Kitchen.. 1po once daily 8)  Triamcinolone Acetonide 0.1 % Crea (Triamcinolone acetonide) .... Use asd two times a day as needed  Patient Instructions: 1)  Please take all new medications as prescribed - the cream for the spot on the face 2)  You will be contacted about the referral(s) to: dermatology in [place] 3)  please try Excedrin migraine as needed for the headaches, and cll if not better 4)  You can also use Mucinex OTC or it's generic for congestion Continue all previous  medications as before this visit  5)  Please schedule a follow-up appointment as needed. Prescriptions: TRIAMCINOLONE ACETONIDE 0.1 % CREA (TRIAMCINOLONE ACETONIDE) use asd two times a day as needed  #1 x 1   Entered and Authorized by:   Corwin Levins MD   Signed by:   Corwin Levins MD on 03/22/2010   Method used:   Print then Give to Patient   RxID:   [id number]

## 2010-08-24 NOTE — Letter (Signed)
Summary: Maple Mirza MD  Maple Mirza MD   Imported By: Sherian Rein 02/10/2010 14:20:08  _____________________________________________________________________  External Attachment:    Type:   Image     Comment:   External Document

## 2010-08-24 NOTE — Letter (Signed)
Summary: [facility] Consult Scheduled Letter  [facility]  [address] [place], [place] [address]   Phone: [phone]  Fax: [phone]      02/18/2010 MRN: [id number]  [facility] [place] [address] [place], [place]  [address]    Dear Ms. Leppo,      We have scheduled an appointment for you.  At the recommendation of Dr.John, we have scheduled you a consult with ([facility])  on Dr.Kirsteins 04-09-2010 at 9:30AM. Their phone number is [phone].If this appointment day and time is not convenient for you, please feel free to call the office of the doctor you are being referred to at the number listed above and reschedule the appointment.  [facility]  [address],  [place], [place] [address]   Thank you,  Patient Care Coordinator [facility]

## 2010-08-24 NOTE — Progress Notes (Signed)
Summary: cymbalta  Phone Note Call from Patient Call back at Home Phone [phone]   Caller: Patient Reason for Call: Talk to Doctor Summary of Call: Saw md on Tues he rx cymbalta pt states since started taking she been experiencing some nausea, numbness & tingly in her arms and legs. Initial call taken by: Orlan Leavens,  February 11, 2010 9:44 AM  Follow-up for Phone Call        ok to cont the med for now if possible, as the nausea and other go away in 2 to 3 days usually Follow-up by: Corwin Levins MD,  February 11, 2010 9:50 AM  Additional Follow-up for Phone Call Additional follow up Details #1::        Pt advised of above and wil call back in 1 week if sxs do not improve Additional Follow-up by: Margaret Pyle, CMA,  February 11, 2010 9:54 AM

## 2010-08-24 NOTE — Assessment & Plan Note (Signed)
History of Present Illness Visit Type: Initial Consult Primary GI MD: Rob Bunting MD Primary Akim Watkinson: Oliver Barre, MD Requesting Ziyanna Tolin: Oliver Barre, MD Chief Complaint: Patient c/o 2 weeks diarrhea in the mornings. There is no visible blood and no abdominal pain. History of Present Illness:     very pleasant 70 year old woman who has had about 2.5 weeks of much looser than usual stools. Diarrhea.  Non-bloody. She wakes in AM, has loose stools and again later in day.  Definitely loose, this is a big change for her.  No abd pains, but some bloating, rumbling.   She had similar symptoms 10-15 years ago, finally got better  No changes in diet, meds.  No recent abx.  No recent travel recently.  Just moved this past June, city water.  No sick contacts.  She had a colonoscopy 2-3 years ago, was done in [place], Dr. Maple Mirza.      She had a CBC and basic metabolic profile last week. A CBC was normal, she was slightly hypokalemic. Potassium supplements were started            Current Medications (verified): 1)  Calcium 500 Mg Tabs (Calcium) .Marland Kitchen.. 1 By Mouth Two Times A Day 2)  Glucosamine-Chondroitin 500-400 Mg Caps (Glucosamine-Chondroitin) .Marland Kitchen.. 1 By Mouth Two Times A Day 3)  Hydrochlorothiazide 12.5 Mg Caps (Hydrochlorothiazide) .Marland Kitchen.. 1 By Mouth Once Daily 4)  Simvastatin 20 Mg Tabs (Simvastatin) .Marland Kitchen.. 1 By Mouth Once Daily 5)  Carisoprodol 350 Mg Tabs (Carisoprodol) .Marland Kitchen.. 1 By Mouth Two Times A Day As Needed 6)  Cymbalta 60 Mg Cpep (Duloxetine Hcl) .Marland Kitchen.. 1po Once Daily 7)  Lomotil 2.5-0.025 Mg Tabs (Diphenoxylate-Atropine) .Marland Kitchen.. 1po As Needed Loose Stool - Max 8 Tabs Per Day  Allergies (verified): 1)  ! Pcn 2)  ! Sulfa  Past History:  Past Medical History: chronic back pain/chronic nerve pain bilat feet since prior to 2002 - neg EMG/NCS Hypertension Depression GERD recurrent headaches/migraine  Hyperlipidemia Colonic polyps, ( unclear pathology, procedures done in [place]) lumbar disc disease MD roster:  neurology - Dr Maryella Shivers  [place]                   GYN - Dr Lanell Matar - [place] Osteopenia     Past Surgical History: s/p chymopapain injection lumbar 1986 Tubal ligation s/p lumbar laminectomy 1986 s/p ovary cyst 2006 s/p varicose vein removed 2008 Cholecystectomy s/p breast biopsy - neg - 1988   Family History: mother with lung cancer father with DM grandmother with breast cancer no colon cancer  Social History: 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  All other review of systems was otherwise negative.   Vital Signs:  Patient profile:   70 year old female Height:      65 inches Weight:      157.50 pounds BMI:     26.30 BSA:     1.79 Pulse rate:   80 / minute Pulse rhythm:   regular BP sitting:   126 / 78  (left arm)  Vitals Entered By: Lamona Curl CMA Duncan Dull) (May 07, 2010 11:02 AM)  Physical Exam  Additional Exam:  Constitutional: generally well appearing Psychiatric: alert and  oriented times 3 Eyes: extraocular movements intact Mouth: oropharynx moist, no lesions Neck: supple, no lymphadenopathy Cardiovascular: heart regular rate and rythm Lungs: CTA bilaterally Abdomen: soft, non-tender, non-distended, no obvious ascites, no peritoneal signs, normal bowel sounds Extremities: no lower extremity edema bilaterally Skin: no lesions on visible extremities    Impression & Recommendations:  Problem # 1:  Change in bowel habits, diarrhea her acute diarrhea, change in bowel habits is often, usually from any self-limited infection. This has been going on for 2-3 weeks now and that is a bit longer than usual for infectious enteritis. She will get a set of  stool tests today, will also check blood including basic metabolic profile, thyroid testing, sedimentation rate, celiac sprue serologies. I have asked her to stop taking Lomotil and instead she will take any Imodium every morning after waking up and another one or 2 throughout the day as needed. We will get her previous colonoscopy report sent for. If she is still bothered by loose stools after another week or 2 then I would likely try Flagyl empirically and then consider repeating her colonoscopy if needed.  Other Orders: T- * Misc. Laboratory test [phone]) T-Culture, Stool ([id number]) T-Stool for O&P [phone]) T-Fecal WBC [phone]) TLB-CMP (Comprehensive Metabolic Pnl) (80053-COMP) TLB-TSH (Thyroid Stimulating Hormone) (84443-TSH) TLB-Sedimentation Rate (ESR) (85652-ESR) T-Tissue Transglutamase Ab IgA ([id number]) TLB-IgA (Immunoglobulin A) (82784-IGA)  Patient Instructions: 1)  Stool testing (c. diff by PCR, routine culture, ova/parasites, fecal leuks). 2)  You will get lab test(s) done today (cmet, tsh, esr, tTG, total IgA). 3)  Stop lomotil. 4)  Start taking one immodium every morning immediately after waking up.  Can take one more later in day if needed. Only stop the immodium if you become constipated. 5)  We will get records from your [place] colonoscopy.   6)  May need empiric trial of flagyl.  May need repeat colonoscopy. 7)  Call if you worsen. 8)  The medication list was reviewed and reconciled.  All changed / newly prescribed medications were explained.  A complete medication list was provided to the patient / caregiver.

## 2010-08-24 NOTE — Letter (Signed)
Summary: [facility]  [facility]   Imported By: Sherian Rein 02/10/2010 14:23:56  _____________________________________________________________________  External Attachment:    Type:   Image     Comment:   External Document

## 2010-08-24 NOTE — Letter (Signed)
Summary: Maple Mirza MD  Maple Mirza MD   Imported By: Sherian Rein 02/10/2010 14:19:12  _____________________________________________________________________  External Attachment:    Type:   Image     Comment:   External Document

## 2010-08-24 NOTE — Letter (Signed)
Summary: Maple Mirza MD  Maple Mirza MD   Imported By: Sherian Rein 02/10/2010 14:21:03  _____________________________________________________________________  External Attachment:    Type:   Image     Comment:   External Document

## 2010-11-02 ENCOUNTER — Ambulatory Visit: Payer: Self-pay | Admitting: Physical Medicine & Rehabilitation

## 2010-12-02 ENCOUNTER — Encounter: Payer: Self-pay | Admitting: Internal Medicine

## 2010-12-03 ENCOUNTER — Encounter: Payer: Self-pay | Admitting: Internal Medicine

## 2010-12-03 ENCOUNTER — Ambulatory Visit (INDEPENDENT_AMBULATORY_CARE_PROVIDER_SITE_OTHER): Payer: Medicare Other | Admitting: Internal Medicine

## 2010-12-03 DIAGNOSIS — M549 Dorsalgia, unspecified: Secondary | ICD-10-CM

## 2010-12-03 DIAGNOSIS — I10 Essential (primary) hypertension: Secondary | ICD-10-CM

## 2010-12-03 DIAGNOSIS — Z0000 Encounter for general adult medical examination without abnormal findings: Secondary | ICD-10-CM

## 2010-12-03 DIAGNOSIS — F329 Major depressive disorder, single episode, unspecified: Secondary | ICD-10-CM

## 2010-12-03 DIAGNOSIS — E785 Hyperlipidemia, unspecified: Secondary | ICD-10-CM

## 2010-12-03 NOTE — Progress Notes (Signed)
  Subjective:    Patient ID: Crystal Herrera, female    DOB: Feb 17, 1941, 70 y.o.   MRN: [id number]  HPI  Here to f/u; diarrhea resolved from nov 2011;  Has hx of lumbar disc dz, and s/p laminectomy 1985;  Not taking butrans from 2011 at this time; tries to stay active with walking daily and swimming 3 x/per wk since her back surgury to prevent pain to the back, but for the past 3-4 wks has recurrent left lower back pain only with the swimming laps, with pain and tingling to both feet, mild to mod, lasts about 24 hrs and resolves;  No bowel or bladder change, no fever, ususual wt loss, gait change, or falls or LE weakness.  Today is fine, and walking does not seem to bother here.  Pain only seems to occur with swimming the crawl, not the backstroke.  Pt denies chest pain, increased sob or doe, wheezing, orthopnea, PND, increased LE swelling, palpitations, dizziness or syncope.  Pt denies new neurological symptoms such as new headache, or facial or extremity weakness or numbness except for the above.   Pt denies polydipsia, polyuria.  Pt states overall good compliance with meds, trying to follow lower cholesterol diet. Denies worsening depressive symptoms, suicidal ideation, or panic  Past Medical History  Diagnosis Date  . HYPERLIPIDEMIA 02/09/2010  . DEPRESSION 02/09/2010  . COMMON MIGRAINE 02/09/2010  . HYPERTENSION 02/09/2010  . GERD 02/09/2010  . SKIN LESION 03/22/2010  . DEGENERATIVE JOINT DISEASE 02/09/2010  . OCCIPITAL NEURALGIA 06/09/2010  . BACK PAIN, CHRONIC 02/09/2010  . OSTEOPENIA 02/09/2010  . FATIGUE 02/09/2010  . Diarrhea 04/26/2010  . COLONIC POLYPS, HX OF 02/09/2010  . Lumbar disc disease    Past Surgical History  Procedure Date  . Tubal ligation   . Chymopapain injection 1986    Lumbar  . Lumbar laminectomy 1986  . Ovarian cyst removal 2006  . Varicose vein removed 2008  . Cholecystectomy   . Breast surgery 1988    breast biopsy-Neg    reports that she has quit smoking. Her  smoking use included Cigarettes. She does not have any smokeless tobacco history on file. Her alcohol and drug histories not on file. family history includes Breast cancer in her maternal grandmother; Diabetes in her father; and Lung cancer in her mother. Allergies  Allergen Reactions  . Penicillins   . Sulfonamide Derivatives    Review of Systems All otherwise neg per pt     Objective:   Physical Exam BP 142/86  Pulse 80  Temp(Src) 98.1 F (36.7 C) (Oral)  Ht 5\' 5"  (1.651 m)  Wt 163 lb 2 oz (73.993 kg)  BMI 27.15 kg/m2  SpO2 98% Physical Exam  VS noted Constitutional: Pt appears well-developed and well-nourished.  HENT: Head: Normocephalic.  Right Ear: External ear normal.  Left Ear: External ear normal.  Eyes: Conjunctivae and EOM are normal. Pupils are equal, round, and reactive to light.  Neck: Normal range of motion. Neck supple.  Cardiovascular: Normal rate and regular rhythm.   Pulmonary/Chest: Effort normal and breath sounds normal.  Abd:  Soft, NT, non-distended, + BS Neurological: Pt is alert. No cranial nerve deficit. motor/dtr intact, slr neg bilat Skin: Skin is warm. No erythema.  Psychiatric: Pt behavior is normal. Thought content normal. 1+nervous        Assessment & Plan:

## 2010-12-03 NOTE — Patient Instructions (Signed)
Continue all other medications as before Please return in 6 months, or sooner if needed 

## 2010-12-04 ENCOUNTER — Encounter: Payer: Self-pay | Admitting: Internal Medicine

## 2010-12-04 DIAGNOSIS — Z0000 Encounter for general adult medical examination without abnormal findings: Secondary | ICD-10-CM | POA: Insufficient documentation

## 2010-12-04 NOTE — Assessment & Plan Note (Signed)
stable overall by hx and exam, most recent lab reviewed with pt, and pt to continue medical treatment as before  Lab Results  Component Value Date   WBC 5.7 05/07/2010   HGB 13.7 05/07/2010   HCT 40.4 05/07/2010   PLT 240.0 05/07/2010   CHOL 201* 02/09/2010   TRIG 120.0 02/09/2010   HDL 64.90 02/09/2010   LDLDIRECT 122.2 02/09/2010   ALT 20 05/07/2010   AST 20 05/07/2010   NA 138 05/07/2010   K 4.2 05/07/2010   CL 103 05/07/2010   CREATININE 0.8 05/07/2010   BUN 10 05/07/2010   CO2 30 05/07/2010   TSH 1.23 05/07/2010  declines tx at this time such as further statin

## 2010-12-04 NOTE — Assessment & Plan Note (Signed)
/  stable overall by hx and exam, most recent lab reviewed with pt, and pt to continue medical treatment as is  BP Readings from Last 3 Encounters:  12/03/10 142/86  06/21/10 132/80  06/09/10 132/80

## 2010-12-04 NOTE — Assessment & Plan Note (Signed)
D/w pt, overall stable except for mild reproducible neuro symptoms with swimming the crawl stroke only;  Exam benign, ok to cont tx as is,  She should simply avoid this stroke with swimming and do other excercises that do not aggrevate the symptoms;  No need for imaging, or surgical referral at this time

## 2010-12-04 NOTE — Assessment & Plan Note (Signed)
stable overall by hx and exam, and pt to continue medical treatment as before 

## 2010-12-24 ENCOUNTER — Encounter: Payer: Self-pay | Admitting: Internal Medicine

## 2010-12-24 ENCOUNTER — Ambulatory Visit (INDEPENDENT_AMBULATORY_CARE_PROVIDER_SITE_OTHER): Payer: Medicare Other | Admitting: Internal Medicine

## 2010-12-24 VITALS — BP 124/70 | HR 86 | Temp 97.7°F | Ht 65.0 in | Wt 165.5 lb

## 2010-12-24 DIAGNOSIS — M542 Cervicalgia: Secondary | ICD-10-CM | POA: Insufficient documentation

## 2010-12-24 DIAGNOSIS — I10 Essential (primary) hypertension: Secondary | ICD-10-CM

## 2010-12-24 DIAGNOSIS — F329 Major depressive disorder, single episode, unspecified: Secondary | ICD-10-CM

## 2010-12-24 MED ORDER — CELECOXIB 200 MG PO CAPS
200.0000 mg | ORAL_CAPSULE | Freq: Two times a day (BID) | ORAL | Status: DC
Start: 2010-12-24 — End: 2010-12-29

## 2010-12-24 NOTE — Assessment & Plan Note (Signed)
Although in a similar location, I suspect this pain she is concerned today more likely due to underlying c-spine DJD/DDD, mild and seems easily improved with nsaid;  Will hold on gabapentin or further pain management referral, will tx with celebrex prn to try to minimize GI risk

## 2010-12-24 NOTE — Progress Notes (Signed)
Subjective:    Patient ID: Crystal Herrera, female    DOB: January 23, 1941, 70 y.o.   MRN: [id number]  HPI  Here after right occipital neuralgia tx at [facility] with nerve block supposed to last for a yr, only lasted about 6 wks per pt b/c now with recurrent pain to the base of the neck with some radiation to the neck;  Pt denies fever, wt loss, night sweats, loss of appetite, or other constitutional symptoms, but has had some nonprod cough and right ear minor fullness off and on as well with recent URI now essentialy resolved.  Pt denies chest pain, increased sob or doe, wheezing, orthopnea, PND, increased LE swelling, palpitations, dizziness or syncope.  Pt denies new neurological symptoms such as new headache, or facial or extremity weakness or numbness   Pt denies polydipsia, polyuria  Pt states overall good compliance with meds, trying to follow lower cholesterol diet, wt overall stable but little exercise however.   Just back from 4 mo at [facility] where she and husband wintered.  Pt without  bowel or bladder change, fever, wt loss,  worsening LE pain/numbness/weakness, gait change or falls.  Neck pain was improved yesterday with alleve x 1;  No pain today but still concerned.  Denies worsening depressive symptoms, suicidal ideation, or panic. Past Medical History  Diagnosis Date  . HYPERLIPIDEMIA 02/09/2010  . DEPRESSION 02/09/2010  . COMMON MIGRAINE 02/09/2010  . HYPERTENSION 02/09/2010  . GERD 02/09/2010  . SKIN LESION 03/22/2010  . DEGENERATIVE JOINT DISEASE 02/09/2010  . OCCIPITAL NEURALGIA 06/09/2010  . BACK PAIN, CHRONIC 02/09/2010  . OSTEOPENIA 02/09/2010  . FATIGUE 02/09/2010  . Diarrhea 04/26/2010  . COLONIC POLYPS, HX OF 02/09/2010  . Lumbar disc disease    Past Surgical History  Procedure Date  . Tubal ligation   . Chymopapain injection 1986    Lumbar  . Lumbar laminectomy 1986  . Ovarian cyst removal 2006  . Varicose vein removed 2008  . Cholecystectomy   . Breast surgery 1988     breast biopsy-Neg    reports that she has quit smoking. Her smoking use included Cigarettes. She does not have any smokeless tobacco history on file. Her alcohol and drug histories not on file. family history includes Breast cancer in her maternal grandmother; Diabetes in her father; and Lung cancer in her mother. Allergies  Allergen Reactions  . Penicillins   . Sulfonamide Derivatives    Current Outpatient Prescriptions on File Prior to Visit  Medication Sig Dispense Refill  . acetaminophen (TYLENOL) 325 MG tablet Take 650 mg by mouth every 6 (six) hours as needed.        . calcium gluconate 500 MG tablet Take 500 mg by mouth 2 (two) times daily.        . carisoprodol (SOMA) 350 MG tablet Take 350 mg by mouth 2 (two) times daily.        . DULoxetine (CYMBALTA) 60 MG capsule Take 60 mg by mouth daily.        Marland Kitchen glucosamine-chondroitin 500-400 MG tablet Take 1 tablet by mouth 2 (two) times daily.        . simvastatin (ZOCOR) 20 MG tablet Take 20 mg by mouth at bedtime.         Review of Systems All otherwise neg per pt     Objective:   Physical Exam BP 124/70  Pulse 86  Temp(Src) 97.7 F (36.5 C) (Oral)  Ht 5\' 5"  (1.651 m)  Wt 165  lb 8 oz (75.07 kg)  BMI 27.54 kg/m2  SpO2 96% Physical Exam  VS noted Constitutional: Pt appears well-developed and well-nourished.  HENT: Head: Normocephalic.  Right Ear: External ear normal.  Left Ear: External ear normal.  Bilat tm's clear, pharynx benign Eyes: Conjunctivae and EOM are normal. Pupils are equal, round, and reactive to light.  Neck: Normal range of motion. Neck supple.  Cardiovascular: Normal rate and regular rhythm.   Pulmonary/Chest: Effort normal and breath sounds normal.  Neurological: Pt is alert. No cranial nerve deficit.  Skin: Skin is warm. No erythema.  Psychiatric: Pt behavior is normal. Thought content normal.  Not depressed appaering, 1+ nervous MSK: post base of neck approx c6-7 tender somewhat in the midline, no  rash, swelling;  Right paracervical tender noted as well       Assessment & Plan:

## 2010-12-24 NOTE — Patient Instructions (Signed)
Take all new medications as prescribed Continue all other medications as before Please return in 6 months, or sooner if needed 

## 2010-12-24 NOTE — Assessment & Plan Note (Signed)
stable overall by hx and exam, most recent data reviewed with pt, and pt to continue medical treatment as before  BP Readings from Last 3 Encounters:  12/24/10 124/70  12/03/10 142/86  06/21/10 132/80

## 2010-12-24 NOTE — Assessment & Plan Note (Signed)
stable overall by hx and exam, most recent data reviewed with pt, and pt to continue medical treatment as before  Lab Results  Component Value Date   WBC 5.7 05/07/2010   HGB 13.7 05/07/2010   HCT 40.4 05/07/2010   PLT 240.0 05/07/2010   CHOL 201* 02/09/2010   TRIG 120.0 02/09/2010   HDL 64.90 02/09/2010   LDLDIRECT 122.2 02/09/2010   ALT 20 05/07/2010   AST 20 05/07/2010   NA 138 05/07/2010   K 4.2 05/07/2010   CL 103 05/07/2010   CREATININE 0.8 05/07/2010   BUN 10 05/07/2010   CO2 30 05/07/2010   TSH 1.23 05/07/2010

## 2010-12-29 ENCOUNTER — Telehealth: Payer: Self-pay

## 2010-12-29 MED ORDER — MELOXICAM 15 MG PO TABS: 15.0000 mg | ORAL_TABLET | Freq: Every day | ORAL | Status: DC

## 2010-12-29 NOTE — Telephone Encounter (Signed)
Changed to mobic per emr

## 2010-12-29 NOTE — Telephone Encounter (Signed)
Pt called stating she was contacted by [facility] and advised that Celebrex may cause reaction due to pt's Sulfa drug allergy. Per pt, Rx was also very expensive. Pt is requesting alternative generic medication to mail order pharmacy

## 2010-12-30 ENCOUNTER — Telehealth: Payer: Self-pay

## 2010-12-30 MED ORDER — OMEPRAZOLE 20 MG PO CPDR: 20.0000 mg | DELAYED_RELEASE_CAPSULE | Freq: Every day | ORAL | Status: DC

## 2010-12-30 NOTE — Telephone Encounter (Signed)
Pt was advised of Rx change from Celebrex to Meloxicam but she is requesting rx for "stomach buffer" please advise.

## 2010-12-30 NOTE — Telephone Encounter (Signed)
Pt advised or Rx and pharmacy

## 2010-12-30 NOTE — Telephone Encounter (Signed)
Done per emr 

## 2010-12-30 NOTE — Telephone Encounter (Signed)
Pt advised of Rx and pharmacy 

## 2010-12-30 NOTE — Telephone Encounter (Signed)
Pt was advised of medication change from Celebrex to Meloxicam but is requesting Rx for "stomach buffer" please advise.

## 2011-01-10 ENCOUNTER — Telehealth: Payer: Self-pay

## 2011-01-10 NOTE — Telephone Encounter (Signed)
Pt called stating she is still experiencing increased pain to the bas of her skull and Meloxicam is not helping. Pt is requesting advisement from MD.

## 2011-01-10 NOTE — Telephone Encounter (Signed)
Pt advised and agreed 

## 2011-01-10 NOTE — Telephone Encounter (Signed)
Ok to try 2 tylenol 8 hour in addition to see if helps

## 2011-01-17 ENCOUNTER — Telehealth: Payer: Self-pay

## 2011-01-17 DIAGNOSIS — M5481 Occipital neuralgia: Secondary | ICD-10-CM

## 2011-01-17 NOTE — Telephone Encounter (Signed)
Pt called stating she is still having HA and medication RX'd are not helping. Pt is requesting referral to Neurologist.

## 2011-01-17 NOTE — Telephone Encounter (Signed)
Pt's spouse advised and will expect a call from [facility] with appt info 

## 2011-01-17 NOTE — Telephone Encounter (Signed)
Done per emr 

## 2011-02-15 ENCOUNTER — Other Ambulatory Visit: Payer: Self-pay

## 2011-02-15 MED ORDER — SIMVASTATIN 20 MG PO TABS: 20.0000 mg | ORAL_TABLET | Freq: Every day | ORAL | Status: DC

## 2011-02-15 MED ORDER — HYDROCHLOROTHIAZIDE 12.5 MG PO CAPS: 12.5000 mg | ORAL_CAPSULE | Freq: Every day | ORAL | Status: DC

## 2011-02-15 MED ORDER — DULOXETINE HCL 60 MG PO CPEP: 60.0000 mg | ORAL_CAPSULE | Freq: Every day | ORAL | Status: DC

## 2011-02-15 MED ORDER — CARISOPRODOL 350 MG PO TABS: 350.0000 mg | ORAL_TABLET | Freq: Two times a day (BID) | ORAL | Status: DC

## 2011-05-24 ENCOUNTER — Other Ambulatory Visit (INDEPENDENT_AMBULATORY_CARE_PROVIDER_SITE_OTHER): Payer: Medicare Other

## 2011-05-24 ENCOUNTER — Encounter: Payer: Self-pay | Admitting: Internal Medicine

## 2011-05-24 ENCOUNTER — Ambulatory Visit (INDEPENDENT_AMBULATORY_CARE_PROVIDER_SITE_OTHER): Payer: Medicare Other | Admitting: Internal Medicine

## 2011-05-24 DIAGNOSIS — R109 Unspecified abdominal pain: Secondary | ICD-10-CM | POA: Insufficient documentation

## 2011-05-24 DIAGNOSIS — Z23 Encounter for immunization: Secondary | ICD-10-CM

## 2011-05-24 DIAGNOSIS — R5383 Other fatigue: Secondary | ICD-10-CM

## 2011-05-24 DIAGNOSIS — R5381 Other malaise: Secondary | ICD-10-CM

## 2011-05-24 DIAGNOSIS — E785 Hyperlipidemia, unspecified: Secondary | ICD-10-CM

## 2011-05-24 DIAGNOSIS — I10 Essential (primary) hypertension: Secondary | ICD-10-CM

## 2011-05-24 LAB — URINALYSIS, ROUTINE W REFLEX MICROSCOPIC
Bilirubin Urine: NEGATIVE
Ketones, ur: NEGATIVE
Leukocytes, UA: NEGATIVE
Nitrite: NEGATIVE
Specific Gravity, Urine: 1.015 (ref 1.000–1.030)
Total Protein, Urine: NEGATIVE
Urine Glucose: NEGATIVE
Urobilinogen, UA: 0.2 (ref 0.0–1.0)
pH: 7.5 (ref 5.0–8.0)

## 2011-05-24 LAB — CBC WITH DIFFERENTIAL/PLATELET
Basophils Absolute: 0 10*3/uL (ref 0.0–0.1)
Basophils Relative: 0.6 % (ref 0.0–3.0)
Eosinophils Absolute: 0.1 10*3/uL (ref 0.0–0.7)
Eosinophils Relative: 0.8 % (ref 0.0–5.0)
HCT: 45.3 % (ref 36.0–46.0)
Hemoglobin: 14.9 g/dL (ref 12.0–15.0)
Lymphocytes Relative: 28.9 % (ref 12.0–46.0)
Lymphs Abs: 1.7 10*3/uL (ref 0.7–4.0)
MCHC: 33 g/dL (ref 30.0–36.0)
MCV: 95.9 fl (ref 78.0–100.0)
Monocytes Absolute: 0.4 10*3/uL (ref 0.1–1.0)
Monocytes Relative: 6.7 % (ref 3.0–12.0)
Neutro Abs: 3.8 10*3/uL (ref 1.4–7.7)
Neutrophils Relative %: 63 % (ref 43.0–77.0)
Platelets: 271 10*3/uL (ref 150.0–400.0)
RBC: 4.73 Mil/uL (ref 3.87–5.11)
RDW: 13.2 % (ref 11.5–14.6)
WBC: 6 10*3/uL (ref 4.5–10.5)

## 2011-05-24 LAB — BASIC METABOLIC PANEL
BUN: 8 mg/dL (ref 6–23)
CO2: 32 mEq/L (ref 19–32)
Calcium: 9.5 mg/dL (ref 8.4–10.5)
Chloride: 98 mEq/L (ref 96–112)
Creatinine, Ser: 0.8 mg/dL (ref 0.4–1.2)
GFR: 74.06 mL/min (ref >60.00)
Glucose, Bld: 105 mg/dL — ABNORMAL HIGH (ref 70–99)
Potassium: 3.4 mEq/L — ABNORMAL LOW (ref 3.5–5.1)
Sodium: 137 mEq/L (ref 135–145)

## 2011-05-24 LAB — HEPATIC FUNCTION PANEL
ALT: 20 U/L (ref 0–35)
AST: 19 U/L (ref 0–37)
Albumin: 4.6 g/dL (ref 3.5–5.2)
Alkaline Phosphatase: 75 U/L (ref 39–117)
Bilirubin, Direct: 0.1 mg/dL (ref 0.0–0.3)
Total Bilirubin: 0.4 mg/dL (ref 0.3–1.2)
Total Protein: 7.3 g/dL (ref 6.0–8.3)

## 2011-05-24 LAB — LIPID PANEL
Cholesterol: 130 mg/dL (ref 0–200)
HDL: 59 mg/dL (ref >39.00)
LDL Cholesterol: 58 mg/dL (ref 0–99)
Total CHOL/HDL Ratio: 2
Triglycerides: 63 mg/dL (ref 0.0–149.0)
VLDL: 12.6 mg/dL (ref 0.0–40.0)

## 2011-05-24 LAB — H. PYLORI ANTIBODY, IGG: H Pylori IgG: NEGATIVE

## 2011-05-24 LAB — TSH: TSH: 1.24 u[IU]/mL (ref 0.35–5.50)

## 2011-05-24 MED ORDER — ESOMEPRAZOLE MAGNESIUM 40 MG PO CPDR: 40.0000 mg | DELAYED_RELEASE_CAPSULE | Freq: Every day | ORAL | Status: DC

## 2011-05-24 MED ORDER — DULOXETINE HCL 60 MG PO CPEP: 60.0000 mg | ORAL_CAPSULE | Freq: Every day | ORAL | Status: DC

## 2011-05-24 MED ORDER — SIMVASTATIN 20 MG PO TABS: 20.0000 mg | ORAL_TABLET | Freq: Every day | ORAL | Status: DC

## 2011-05-24 MED ORDER — HYDROCHLOROTHIAZIDE 12.5 MG PO CAPS: 12.5000 mg | ORAL_CAPSULE | Freq: Every day | ORAL | Status: DC

## 2011-05-24 MED ORDER — CARISOPRODOL 350 MG PO TABS: 350.0000 mg | ORAL_TABLET | Freq: Two times a day (BID) | ORAL | Status: DC

## 2011-05-24 NOTE — Assessment & Plan Note (Addendum)
Hx and exam Most c/w gastritis but cant r/o malignancy, PUD -  for PPI, to check labs, refer GI - with recent wt loss may need EGD  Note:  Today's fact to face history, exam, counseling with pt regarding differential etiology, plan for eval and treatment lasting 40 min, with further documention for total 50 min

## 2011-05-24 NOTE — Patient Instructions (Addendum)
You had the flu shot today OK to stop the prilosec Start the nexium 40 mg per day Continue all other medications as before Please go to LAB in the Basement for the blood and/or urine tests to be done today Please call the phone number [phone] (the [employer] System) for results of testing in 2-3 days;  When calling, simply dial the number, and when prompted enter the MRN number above (the Medical Record Number) and the # key, then the message should start. You will be contacted regarding the referral for: GI  OK to cancel your appt for next wk  Please return in 1 year for your yearly visit, or sooner if needed

## 2011-05-24 NOTE — Progress Notes (Signed)
Subjective:    Patient ID: Crystal Herrera, female    DOB: Dec 01, 1940, 70 y.o.   MRN: [id number]  HPI  Here with 2 wks onset  Upper abd pain, mod for the most part, sometimes worse, worse with eating regular diet, so back to eating the BRAT diet which seems to be helpful, but PPI  - prilosec 20 qd not helpful this time.  No n/v , but constant, even at night, keeps her awake at night, ? Better with pressure with a pillow on the abdomen to get to sleep;  No radiation, pt wonders if could be related to tylneol use for 3 wks consistently for neck pain now bette (has recurrent neuritic HA many days beginnng for some reason about 10 AM);  Did see neurologist with nerve block to occipital area at [facility] 0- last visit 1 wk ago. Only took 2 tylenol in the AM for about those 3 wks. Prior to neurology.  Has some recurrent constioation, better with applesuace daily, no blood or diarrhea.  No change/no use of otc nsaids;  Has ETOH - usually 1-2 glass wine per day, but none in recent wks.  S/p CCX.  Has seen GI in [place] for similar issue in the past - dx with gastritis. This episode lost about 6 lbs.last EGD approx 2006-2008 but not sure. Past Medical History  Diagnosis Date  . HYPERLIPIDEMIA 02/09/2010  . DEPRESSION 02/09/2010  . COMMON MIGRAINE 02/09/2010  . HYPERTENSION 02/09/2010  . GERD 02/09/2010  . SKIN LESION 03/22/2010  . DEGENERATIVE JOINT DISEASE 02/09/2010  . OCCIPITAL NEURALGIA 06/09/2010  . BACK PAIN, CHRONIC 02/09/2010  . OSTEOPENIA 02/09/2010  . FATIGUE 02/09/2010  . Diarrhea 04/26/2010  . COLONIC POLYPS, HX OF 02/09/2010  . Lumbar disc disease    Past Surgical History  Procedure Date  . Tubal ligation   . Chymopapain injection 1986    Lumbar  . Lumbar laminectomy 1986  . Ovarian cyst removal 2006  . Varicose vein removed 2008  . Cholecystectomy   . Breast surgery 1988    breast biopsy-Neg    reports that she has quit smoking. Her smoking use included Cigarettes. She does not have  any smokeless tobacco history on file. Her alcohol and drug histories not on file. family history includes Breast cancer in her maternal grandmother; Diabetes in her father; and Lung cancer in her mother. Allergies  Allergen Reactions  . Penicillins   . Sulfonamide Derivatives    Current Outpatient Prescriptions on File Prior to Visit  Medication Sig Dispense Refill  . calcium gluconate 500 MG tablet Take 500 mg by mouth 2 (two) times daily.        Marland Kitchen glucosamine-chondroitin 500-400 MG tablet Take 1 tablet by mouth 2 (two) times daily.        Marland Kitchen acetaminophen (TYLENOL) 325 MG tablet Take 650 mg by mouth every 6 (six) hours as needed.         Review of Systems Review of Systems  Constitutional: Negative for diaphoresis and unexpected weight change.  HENT: Negative for drooling and tinnitus.   Eyes: Negative for photophobia and visual disturbance.  Respiratory: Negative for choking and stridor.   Gastrointestinal: Negative for vomiting and blood in stool.  Genitourinary: Negative for hematuria and decreased urine volume.    Objective:   Physical Exam BP 132/80  Pulse 78  Temp(Src) 98.8 F (37.1 C) (Oral)  Ht 5\' 5"  (1.651 m)  Wt 153 lb 4 oz (69.514 kg)  BMI 25.50 kg/m2  SpO2 92% Physical Exam  VS noted Constitutional: Pt appears well-developed and well-nourished.  HENT: Head: Normocephalic.  Right Ear: External ear normal.  Left Ear: External ear normal.  Eyes: Conjunctivae and EOM are normal. Pupils are equal, round, and reactive to light.  Neck: Normal range of motion. Neck supple.  Cardiovascular: Normal rate and regular rhythm.   Pulmonary/Chest: Effort normal and breath sounds normal.  Abd:  Soft, NT, non-distended, + BS except for mild epigastric tender, no guarding or rebound Neurological: Pt is alert. No cranial nerve deficit.  Skin: Skin is warm. No erythema.  Psychiatric: Pt behavior is normal. Thought content normal.     Assessment & Plan:

## 2011-05-26 ENCOUNTER — Encounter: Payer: Self-pay | Admitting: Internal Medicine

## 2011-05-26 NOTE — Assessment & Plan Note (Signed)
stable overall by hx and exam, most recent data reviewed with pt, and pt to continue medical treatment as before  Lab Results  Component Value Date   LDLCALC 58 05/24/2011

## 2011-05-26 NOTE — Assessment & Plan Note (Signed)
stable overall by hx and exam, most recent data reviewed with pt, and pt to continue medical treatment as before  BP Readings from Last 3 Encounters:  05/24/11 132/80  12/24/10 124/70  12/03/10 142/86

## 2011-05-26 NOTE — Assessment & Plan Note (Signed)
Etiology unclear, Exam otherwise benign, to check labs as documented, follow with expectant management  

## 2011-05-27 ENCOUNTER — Telehealth: Payer: Self-pay | Admitting: Gastroenterology

## 2011-05-27 NOTE — Telephone Encounter (Signed)
i agree. 

## 2011-05-27 NOTE — Telephone Encounter (Signed)
Pt is having upper left side abd pain, eating makes it worse.  Pt also complains of bloating.  Has had this same pain in the past.   Pt was advised to take her Nexium twice daily and keep appt for 11/12 and call back if symptoms change or worsen.  I will forward to Dr Christella Hartigan for review

## 2011-05-30 NOTE — Telephone Encounter (Signed)
Error

## 2011-05-31 ENCOUNTER — Ambulatory Visit (INDEPENDENT_AMBULATORY_CARE_PROVIDER_SITE_OTHER): Payer: Medicare Other | Admitting: Gastroenterology

## 2011-05-31 ENCOUNTER — Encounter: Payer: Self-pay | Admitting: Gastroenterology

## 2011-05-31 DIAGNOSIS — R109 Unspecified abdominal pain: Secondary | ICD-10-CM

## 2011-05-31 NOTE — Progress Notes (Signed)
Review of gastrointestinal problems:  1. Acute diarrheal illness, likely infectious. Symptoms improved over about 4 weeks. October 2011. CBC, complete metabolic profile, celiac sprue testing, sedimentation rate all negative. Stool tests for usual infectious etiologies were all negative. Colonoscopy out of state in [place] 2-3 years prior was normal per patient.  11/12 this improved and never returned.  HPI: This is a   very pleasant 70 year old woman who is here with her husband today  Abdominal pains 3-4 weeks ago.  Started PPI, brat diet. This was epigastric, constant, burning. Nauseas once, but not a lot. No vomiting.  3 days of brat diet, was still uncomfortable.  Saw pcp, was put on nexium.  She called last week here, and we told her to double the nexium.  Her stomach is better.   She ate a bit more normally today.  No diarrhea.  1-2 days now, weak and dizzy.    Was taking 2 tylenol for several weeks, but no nsaids.  Was taking it for HAs, neuritis.  No dypshagia.  No pyrosis.    Cbc, cmet, h. Pylori all negative one week ago.  Has lost 8 pounds.  She had chills yesterday, hot yesterday.      Review of systems: Pertinent positive and negative review of systems were noted in the above HPI section. Complete review of systems was performed and was otherwise normal.    Past Medical History  Diagnosis Date  . HYPERLIPIDEMIA 02/09/2010  . DEPRESSION 02/09/2010  . COMMON MIGRAINE 02/09/2010  . HYPERTENSION 02/09/2010  . GERD 02/09/2010  . SKIN LESION 03/22/2010  . DEGENERATIVE JOINT DISEASE 02/09/2010  . OCCIPITAL NEURALGIA 06/09/2010  . BACK PAIN, CHRONIC 02/09/2010  . OSTEOPENIA 02/09/2010  . FATIGUE 02/09/2010  . Diarrhea 04/26/2010  . COLONIC POLYPS, HX OF 02/09/2010  . Lumbar disc disease     Past Surgical History  Procedure Date  . Tubal ligation   . Chymopapain injection 1986    Lumbar  . Lumbar laminectomy 1986  . Ovarian cyst removal 2006  . Varicose vein removed 2008    . Cholecystectomy   . Breast surgery 1988    breast biopsy-Neg    Current Outpatient Prescriptions  Medication Sig Dispense Refill  . acetaminophen (TYLENOL) 325 MG tablet Take 650 mg by mouth every 6 (six) hours as needed.        . calcium gluconate 500 MG tablet Take 500 mg by mouth 2 (two) times daily.        . carisoprodol (SOMA) 350 MG tablet Take 1 tablet (350 mg total) by mouth 2 (two) times daily.  180 tablet  1  . DULoxetine (CYMBALTA) 60 MG capsule Take 1 capsule (60 mg total) by mouth daily.  90 capsule  3  . esomeprazole (NEXIUM) 40 MG capsule Take 40 mg by mouth 2 (two) times daily.        Marland Kitchen glucosamine-chondroitin 500-400 MG tablet Take 1 tablet by mouth 2 (two) times daily.        . hydrochlorothiazide (MICROZIDE) 12.5 MG capsule Take 1 capsule (12.5 mg total) by mouth daily.  90 capsule  3  . simvastatin (ZOCOR) 20 MG tablet Take 1 tablet (20 mg total) by mouth at bedtime.  90 tablet  3    Allergies as of 05/31/2011 - Review Complete 05/31/2011  Allergen Reaction Noted  . Penicillins  02/09/2010  . Sulfonamide derivatives  02/09/2010    Family History  Problem Relation Age of Onset  . Lung cancer  Mother   . Diabetes Father   . Breast cancer Maternal Grandmother   . Colon cancer Maternal Aunt     History   Social History  . Marital Status: Married    Spouse Name: N/A    Number of Children: N/A  . Years of Education: N/A   Occupational History  . Not on file.   Social History Main Topics  . Smoking status: Former Smoker    Types: Cigarettes  . Smokeless tobacco: Never Used  . Alcohol Use: Yes     occ  . Drug Use: No  . Sexually Active: Not on file   Other Topics Concern  . Not on file   Social History Narrative   Coffee daily        Physical Exam: BP 132/76  Pulse 80  Ht 5\' 5"  (1.651 m)  Wt 151 lb (68.493 kg)  BMI 25.13 kg/m2 Constitutional: generally well-appearing Psychiatric: alert and oriented x3 Eyes: extraocular movements  intact Mouth: oral pharynx moist, no lesions Neck: supple no lymphadenopathy Cardiovascular: heart regular rate and rhythm Lungs: clear to auscultation bilaterally Abdomen: soft, nontender, nondistended, no obvious ascites, no peritoneal signs, normal bowel sounds Extremities: no lower extremity edema bilaterally Skin: no lesions on visible extremities    Assessment and plan: 70 y.o. female with  recent abdominal pains, current dizziness, weakness  Her abdominal pains seem to have improved on twice daily proton pump inhibitor. She is not eating normally but is better as of yesterday or the day before. We will proceed with EGD tomorrow. If that is normal and if she is continuing to have dizziness, weakness and I think she'll need further blood tests and imaging studies. Of note her blood pressure was normal pulse felt strong today. She is having no neurologic symptoms such as numbness or tingling or focal weaknesses.

## 2011-05-31 NOTE — Patient Instructions (Signed)
You will be set up for an upper endoscopy tomorrow afternoon.

## 2011-06-01 ENCOUNTER — Encounter: Payer: Self-pay | Admitting: Gastroenterology

## 2011-06-01 ENCOUNTER — Ambulatory Visit (AMBULATORY_SURGERY_CENTER): Payer: Medicare Other | Admitting: Gastroenterology

## 2011-06-01 VITALS — HR 82 | Temp 97.9°F | Resp 18 | Ht 65.0 in | Wt 151.0 lb

## 2011-06-01 DIAGNOSIS — R109 Unspecified abdominal pain: Secondary | ICD-10-CM

## 2011-06-01 DIAGNOSIS — K2970 Gastritis, unspecified, without bleeding: Secondary | ICD-10-CM

## 2011-06-01 DIAGNOSIS — K2940 Chronic atrophic gastritis without bleeding: Secondary | ICD-10-CM

## 2011-06-01 MED ORDER — SODIUM CHLORIDE 0.9 % IV SOLN: 500.0000 mL | INTRAVENOUS | Status: DC

## 2011-06-01 NOTE — Patient Instructions (Signed)
Please refer to blue and green discharge instruction sheets. 

## 2011-06-02 ENCOUNTER — Telehealth: Payer: Self-pay | Admitting: *Deleted

## 2011-06-02 NOTE — Telephone Encounter (Signed)

## 2011-06-06 ENCOUNTER — Telehealth: Payer: Self-pay | Admitting: Gastroenterology

## 2011-06-06 ENCOUNTER — Ambulatory Visit: Payer: Medicare Other | Admitting: Gastroenterology

## 2011-06-06 NOTE — Telephone Encounter (Signed)
Pt notified that the results are not yet available I will call as soon as reviewed

## 2011-06-08 ENCOUNTER — Telehealth: Payer: Self-pay

## 2011-06-08 ENCOUNTER — Telehealth: Payer: Self-pay | Admitting: Gastroenterology

## 2011-06-08 MED ORDER — ESOMEPRAZOLE MAGNESIUM 40 MG PO CPDR: 40.0000 mg | DELAYED_RELEASE_CAPSULE | Freq: Every day | ORAL | Status: DC

## 2011-06-08 MED ORDER — ESOMEPRAZOLE MAGNESIUM 40 MG PO CPDR: 40.0000 mg | DELAYED_RELEASE_CAPSULE | Freq: Two times a day (BID) | ORAL | Status: DC

## 2011-06-08 NOTE — Telephone Encounter (Signed)
PT NEEDED REFILL ON NEXIUM SENT TO PHARMACY

## 2011-06-08 NOTE — Telephone Encounter (Signed)
Done per emr 

## 2011-06-08 NOTE — Telephone Encounter (Signed)
Patient informed the pharmacy she is now taking nexium bid they need new rx so insurance will cover.

## 2011-06-13 ENCOUNTER — Ambulatory Visit: Payer: PRIVATE HEALTH INSURANCE | Admitting: Internal Medicine

## 2011-07-04 ENCOUNTER — Telehealth: Payer: Self-pay | Admitting: Gastroenterology

## 2011-07-04 NOTE — Telephone Encounter (Signed)
i agree. 

## 2011-07-04 NOTE — Telephone Encounter (Signed)
Pt advises that she has abd pain only when she eats certain foods, she was told to avoid things that cause her pain and to continue her Nexium.  If she has further problems or concerns while monitoring her diet she is to call back.  Pt agreed

## 2011-07-11 ENCOUNTER — Telehealth: Payer: Self-pay | Admitting: Gastroenterology

## 2011-07-11 NOTE — Telephone Encounter (Signed)
Pt continues to have burning in the stomach.  She has tried to modify her diet and she has been taking her nexium twice daily.   Pt given appt with Amy Esterwood on 07/14/11 for 3 pm

## 2011-07-14 ENCOUNTER — Other Ambulatory Visit (INDEPENDENT_AMBULATORY_CARE_PROVIDER_SITE_OTHER): Payer: Medicare Other

## 2011-07-14 ENCOUNTER — Encounter: Payer: Self-pay | Admitting: Physician Assistant

## 2011-07-14 ENCOUNTER — Ambulatory Visit (INDEPENDENT_AMBULATORY_CARE_PROVIDER_SITE_OTHER): Payer: Medicare Other | Admitting: Physician Assistant

## 2011-07-14 VITALS — BP 128/74 | HR 80 | Ht 65.0 in | Wt 148.0 lb

## 2011-07-14 DIAGNOSIS — R1011 Right upper quadrant pain: Secondary | ICD-10-CM

## 2011-07-14 DIAGNOSIS — K2900 Acute gastritis without bleeding: Secondary | ICD-10-CM

## 2011-07-14 LAB — BASIC METABOLIC PANEL
BUN: 13 mg/dL (ref 6–23)
CO2: 30 mEq/L (ref 19–32)
Calcium: 9.2 mg/dL (ref 8.4–10.5)
Chloride: 102 mEq/L (ref 96–112)
Creatinine, Ser: 0.8 mg/dL (ref 0.4–1.2)
GFR: 71.97 mL/min (ref >60.00)
Glucose, Bld: 94 mg/dL (ref 70–99)
Potassium: 3.6 mEq/L (ref 3.5–5.1)
Sodium: 141 mEq/L (ref 135–145)

## 2011-07-14 NOTE — Patient Instructions (Signed)
Please go to the basement level to have your labs drawn.   You have been scheduled for a CT scan of the abdomen and pelvis at  CT ([address]---this is in the same building as [facility]).   You are scheduled on 07-15-2011 at 3:00 PM . You should arrive 15 minutes prior to your appointment time for registration. Please follow the written instructions below on the day of your exam:  WARNING: IF YOU ARE ALLERGIC TO IODINE/X-RAY DYE, PLEASE NOTIFY RADIOLOGY IMMEDIATELY AT [phone]! YOU WILL BE GIVEN A 13 HOUR PREMEDICATION PREP.  1) Do not eat or drink anything after 11:00 AM (4 hours prior to your test) 2) You have been given 2 bottles of oral contrast to drink. The solution may taste               better if refrigerated, but do NOT add ice or any other liquid to this solution. Shake             well before drinking.    Drink 1 bottle of contrast @ 1:00 PM  (2 hours prior to your exam)  Drink 1 bottle of contrast @ 2:00 PM  (1 hour prior to your exam)  You may take any medications as prescribed with a small amount of water except for the following: Metformin, Glucophage, Glucovance, Avandamet, Riomet, Fortamet, Actoplus Met, Janumet, Glumetza or Metaglip. The above medications must be held the day of the exam AND 48 hours after the exam.  The purpose of you drinking the oral contrast is to aid in the visualization of your intestinal tract. The contrast solution may cause some diarrhea. Before your exam is started, you will be given a small amount of fluid to drink. Depending on your individual set of symptoms, you may also receive an intravenous injection of x-ray contrast/dye. Plan on being at [facility] for 30 minutes or long, depending on the type of exam you are having performed.  If you have any questions regarding your exam or if you need to reschedule, you may call the CT department at [phone] between the hours of 8:00 am and 5:00 pm,  Monday-Friday.  ________________________________________________________________________

## 2011-07-15 ENCOUNTER — Ambulatory Visit (INDEPENDENT_AMBULATORY_CARE_PROVIDER_SITE_OTHER)
Admission: RE | Admit: 2011-07-15 | Discharge: 2011-07-15 | Disposition: A | Discharge status: Home / Self Care | Payer: Medicare Other | Source: Ambulatory Visit | Attending: Physician Assistant | Admitting: Physician Assistant

## 2011-07-15 ENCOUNTER — Encounter: Payer: Self-pay | Admitting: Physician Assistant

## 2011-07-15 DIAGNOSIS — R1011 Right upper quadrant pain: Secondary | ICD-10-CM

## 2011-07-15 MED ORDER — IOHEXOL 300 MG/ML  SOLN: 100.0000 mL | Freq: Once | INTRAMUSCULAR | Status: AC | PRN

## 2011-07-15 NOTE — Progress Notes (Signed)
Subjective:    Patient ID: Crystal Herrera, female    DOB: April 20, 1941, 70 y.o.   MRN: [id number]  HPI Storie is a 70 year old female known to Dr. Christella Hartigan. She underwent recent upper endoscopy in November of 2012 due to complaint of epigastric pain despite PPI therapy. She was found to have mild gastritis, biopsies for H. pylori were negative. Her Nexium was increased to twice daily, and despite that over the past several weeks she continues to complain of upper abdominal pain. She does not feel that she is really any better on the Nexium and describes her pain as a constant burning sensation. She says she thought she was doing a bit better now over the past couple of days her symptoms are back. Her appetite has been okay she has not had any heartburn or indigestion no dysphagia or odynophagia. She currently describes an epigastric and right-sided abdominal pain which radiates through to her back. She has not had any nausea or vomiting no fever or chills no changes in her bowel habits. She is not on any aspirin or NSAIDs. She is status post cholecystectomy and says that this pain is different than her gallbladder type pain.  Labs were done in 05/24/2011 with CBC and hepatic panel which were unremarkable.    Review of Systems  Constitutional: Positive for appetite change and fatigue.  HENT: Negative.   Eyes: Negative.   Respiratory: Negative.   Cardiovascular: Negative.   Gastrointestinal: Positive for abdominal pain.  Genitourinary: Negative.   Musculoskeletal: Positive for back pain.  Neurological: Negative.   Hematological: Negative.   Psychiatric/Behavioral: Negative.    Outpatient Prescriptions Prior to Visit  Medication Sig Dispense Refill  . acetaminophen (TYLENOL) 325 MG tablet Take 650 mg by mouth every 6 (six) hours as needed.        . calcium gluconate 500 MG tablet Take 500 mg by mouth 2 (two) times daily.        . carisoprodol (SOMA) 350 MG tablet Take 1 tablet (350 mg total) by  mouth 2 (two) times daily.  180 tablet  1  . DULoxetine (CYMBALTA) 60 MG capsule Take 1 capsule (60 mg total) by mouth daily.  90 capsule  3  . esomeprazole (NEXIUM) 40 MG capsule Take 1 capsule (40 mg total) by mouth 2 (two) times daily.  60 capsule  11  . glucosamine-chondroitin 500-400 MG tablet Take 1 tablet by mouth 2 (two) times daily.        . hydrochlorothiazide (MICROZIDE) 12.5 MG capsule Take 1 capsule (12.5 mg total) by mouth daily.  90 capsule  3  . simvastatin (ZOCOR) 20 MG tablet Take 1 tablet (20 mg total) by mouth at bedtime.  90 tablet  3  . esomeprazole (NEXIUM) 40 MG capsule Take 1 capsule (40 mg total) by mouth daily.  60 capsule  11   Facility-Administered Medications Prior to Visit  Medication Dose Route Frequency Provider Last Rate Last Dose  . 0.9 %  sodium chloride infusion  500 mL Intravenous Continuous Rob Bunting, MD       Allergies  Allergen Reactions  . Penicillins   . Sulfonamide Derivatives        Objective:   Physical Exam well-developed white female in no acute distress, pleasant, accompanied by her husband. Blood pressure 128/74 pulse 80. HEENT nontraumatic normocephalic EOMI PERRLA sclera anicteric, Supple no JVD, Cardiovascular regular rate and rhythm with S1-S2 no murmur or gallop, Pulmonary clear bilaterally, Abdomen soft she is tender in the right  upper quadrant and epigastrium no guarding or rebound no palpable mass or hepatosplenomegaly, she has no rib tenderness on palpation. Rectal not done, Extremities no clubbing cyanosis or edema, Psych mood and affect normal an appropriate      Assessment & Plan:  #55 70 year old female with persistent epigastric and right upper quadrant pain now radiating into her back. Recent endoscopy showing only mild gastritis, patient has not improved with twice daily PPI therapy. She is status post previous cholecystectomy. Etiology of her pain is not clear.  Plan; Continue Nexium 40 mg twice daily Schedule for CT  scan of the abdomen and pelvis Consider adding Carafate to her regimen if CT scan is unrevealing.. Also consider musculoskeletal etiology to her pain if CT is negative , she has history of disc disease previously only in her lumbar spine.

## 2011-07-16 NOTE — Progress Notes (Signed)
I agree with CT scan to r/o pancreatic  Disease. If negative , may try carafate for "bile" gastritis ( not on EGD) or trial of LevsinSL.125 mg q 4 hrs prn.

## 2011-07-18 ENCOUNTER — Telehealth: Payer: Self-pay | Admitting: Gastroenterology

## 2011-07-18 NOTE — Telephone Encounter (Signed)
Spoke with pts husband and they are aware of CT results.

## 2011-08-02 DIAGNOSIS — M779 Enthesopathy, unspecified: Secondary | ICD-10-CM | POA: Diagnosis not present

## 2011-08-02 DIAGNOSIS — L509 Urticaria, unspecified: Secondary | ICD-10-CM | POA: Diagnosis not present

## 2011-08-11 DIAGNOSIS — M659 Synovitis and tenosynovitis, unspecified: Secondary | ICD-10-CM | POA: Diagnosis not present

## 2011-08-29 ENCOUNTER — Other Ambulatory Visit: Payer: Self-pay

## 2011-08-29 DIAGNOSIS — K219 Gastro-esophageal reflux disease without esophagitis: Secondary | ICD-10-CM | POA: Diagnosis not present

## 2011-08-29 DIAGNOSIS — K2900 Acute gastritis without bleeding: Secondary | ICD-10-CM | POA: Diagnosis not present

## 2011-08-29 MED ORDER — DULOXETINE HCL 60 MG PO CPEP: 60.0000 mg | ORAL_CAPSULE | Freq: Every day | ORAL | Status: DC

## 2011-08-29 MED ORDER — SIMVASTATIN 20 MG PO TABS: 20.0000 mg | ORAL_TABLET | Freq: Every day | ORAL | Status: DC

## 2011-10-14 DIAGNOSIS — M25549 Pain in joints of unspecified hand: Secondary | ICD-10-CM | POA: Diagnosis not present

## 2011-10-24 ENCOUNTER — Other Ambulatory Visit: Payer: Self-pay

## 2011-10-24 MED ORDER — HYDROCHLOROTHIAZIDE 12.5 MG PO CAPS: 12.5000 mg | ORAL_CAPSULE | Freq: Every day | ORAL | Status: DC

## 2011-11-07 ENCOUNTER — Ambulatory Visit (INDEPENDENT_AMBULATORY_CARE_PROVIDER_SITE_OTHER): Payer: Medicare Other | Admitting: Gastroenterology

## 2011-11-07 ENCOUNTER — Encounter: Payer: Self-pay | Admitting: Gastroenterology

## 2011-11-07 VITALS — BP 134/70 | HR 80 | Ht 65.0 in | Wt 150.0 lb

## 2011-11-07 DIAGNOSIS — R1013 Epigastric pain: Secondary | ICD-10-CM

## 2011-11-07 DIAGNOSIS — K3189 Other diseases of stomach and duodenum: Secondary | ICD-10-CM

## 2011-11-07 MED ORDER — SUCRALFATE 1 GM/10ML PO SUSP: 1.0000 g | Freq: Four times a day (QID) | ORAL | Status: DC

## 2011-11-07 MED ORDER — ESOMEPRAZOLE MAGNESIUM 40 MG PO CPDR: 40.0000 mg | DELAYED_RELEASE_CAPSULE | Freq: Every day | ORAL | Status: DC

## 2011-11-07 NOTE — Progress Notes (Signed)
Review of gastrointestinal problems:  1. Acute diarrheal illness, likely infectious. Symptoms improved over about 4 weeks. October 2011. CBC, complete metabolic profile, celiac sprue testing, sedimentation rate all negative. Stool tests for usual infectious etiologies were all negative. Colonoscopy out of state in [place] 2-3 years prior was normal per patient. 11/12 this improved and never returned. 2. abdominal pain, burning : EGD November 2012 4 abdominal pain, burning found mild gastritis, H. pylori biopsies were negative.  Cbc, lfts normal 10/12.  CT scan 12.12 showed ventral hernia with fat only, otherwise normal.   HPI: This is a  very pleasant 71 year old woman who is here with her husband today. I last saw her 4-5 months ago at the time of the EGD. See those results above. Following that appointment she saw Amy in the office. A CT scan was performed, but those results are below. She was supposed to have started Carafate 2-3 times a day but that prescription was not sent in.  Still has intermittent epigastgric to RUQ pain.  Eating very bland diet.  Avoiding fried foods.  She is eating yogurts.  Grilled burger bothered.  She brought with her a food diary for the past 2 weeks which I reviewed. Most days she had no discomfort. The most severe discomfort was rated a 3/10. This happened twice during the interval.  IMPRESSION from CT scan 12/12:  1. No acute findings identified within the abdomen or pelvis. 2. Prior cholecystectomy. 3. Ventral abdominal wall hernia which contains fat only.  She was started on Carafate 2-3 times a day following the CT scan.    Past Medical History  Diagnosis Date  . HYPERLIPIDEMIA 02/09/2010  . DEPRESSION 02/09/2010  . COMMON MIGRAINE 02/09/2010  . HYPERTENSION 02/09/2010  . GERD 02/09/2010  . SKIN LESION 03/22/2010  . DEGENERATIVE JOINT DISEASE 02/09/2010  . OCCIPITAL NEURALGIA 06/09/2010  . BACK PAIN, CHRONIC 02/09/2010  . OSTEOPENIA 02/09/2010  . FATIGUE  02/09/2010  . Diarrhea 04/26/2010  . COLONIC POLYPS, HX OF 02/09/2010  . Lumbar disc disease     Past Surgical History  Procedure Date  . Tubal ligation   . Chymopapain injection 1986    Lumbar  . Lumbar laminectomy 1986  . Ovarian cyst removal 2006  . Varicose vein removed 2008  . Cholecystectomy   . Breast surgery 1988    breast biopsy-Neg    Current Outpatient Prescriptions  Medication Sig Dispense Refill  . acetaminophen (TYLENOL) 325 MG tablet Take 650 mg by mouth every 6 (six) hours as needed.        . calcium gluconate 500 MG tablet Take 500 mg by mouth 2 (two) times daily.        . carisoprodol (SOMA) 350 MG tablet Take 1 tablet (350 mg total) by mouth 2 (two) times daily.  180 tablet  1  . DULoxetine (CYMBALTA) 60 MG capsule Take 1 capsule (60 mg total) by mouth daily.  90 capsule  2  . esomeprazole (NEXIUM) 40 MG capsule Take 1 capsule (40 mg total) by mouth 2 (two) times daily.  60 capsule  11  . glucosamine-chondroitin 500-400 MG tablet Take 1 tablet by mouth 2 (two) times daily.        . hydrochlorothiazide (MICROZIDE) 12.5 MG capsule Take 1 capsule (12.5 mg total) by mouth daily.  90 capsule  2  . simvastatin (ZOCOR) 20 MG tablet Take 1 tablet (20 mg total) by mouth at bedtime.  90 tablet  2  . DISCONTD: esomeprazole (NEXIUM)  40 MG capsule Take 1 capsule (40 mg total) by mouth daily.  90 capsule  3    Allergies as of 11/07/2011 - Review Complete 11/07/2011  Allergen Reaction Noted  . Penicillins  02/09/2010  . Sulfonamide derivatives  02/09/2010    Family History  Problem Relation Age of Onset  . Lung cancer Mother   . Diabetes Father   . Breast cancer Maternal Grandmother   . Colon cancer Maternal Aunt     History   Social History  . Marital Status: Married    Spouse Name: N/A    Number of Children: N/A  . Years of Education: N/A   Occupational History  . Not on file.   Social History Main Topics  . Smoking status: Former Smoker    Types:  Cigarettes  . Smokeless tobacco: Never Used  . Alcohol Use: No  . Drug Use: No  . Sexually Active: Not on file   Other Topics Concern  . Not on file   Social History Narrative   Coffee daily       Physical Exam: BP 134/70  Pulse 80  Ht 5\' 5"  (1.651 m)  Wt 150 lb (68.04 kg)  BMI 24.96 kg/m2 Constitutional: generally well-appearing Psychiatric: alert and oriented x3 Abdomen: soft, nontender, nondistended, no obvious ascites, no peritoneal signs, normal bowel sounds     Assessment and plan: 71 y.o. female with chronic intermittent abdominal pains, possible gastritis  She will back down on her proton pump inhibitor to once daily since doubling it has not made any difference. I like the ID of her trying Carafate and I have given her a new prescription for her. She will return to see me in 4-6 weeks and sooner if needed.

## 2011-11-07 NOTE — Patient Instructions (Signed)
Try 3-4 times a day carafate liquid, just prior to meals. Decrease nexium to once daily again (before breakfast) Return to see Dr. Christella Hartigan in 5-6 weeks, sooner if needed.

## 2011-11-14 ENCOUNTER — Ambulatory Visit: Payer: Medicare Other | Admitting: Gastroenterology

## 2011-11-21 ENCOUNTER — Encounter: Payer: Self-pay | Admitting: Internal Medicine

## 2011-11-21 ENCOUNTER — Ambulatory Visit (INDEPENDENT_AMBULATORY_CARE_PROVIDER_SITE_OTHER): Payer: Medicare Other | Admitting: Internal Medicine

## 2011-11-21 VITALS — BP 120/70 | HR 72 | Temp 97.8°F | Ht 65.0 in | Wt 150.4 lb

## 2011-11-21 DIAGNOSIS — M545 Low back pain, unspecified: Secondary | ICD-10-CM

## 2011-11-21 DIAGNOSIS — F3289 Other specified depressive episodes: Secondary | ICD-10-CM

## 2011-11-21 DIAGNOSIS — I10 Essential (primary) hypertension: Secondary | ICD-10-CM | POA: Diagnosis not present

## 2011-11-21 DIAGNOSIS — F329 Major depressive disorder, single episode, unspecified: Secondary | ICD-10-CM | POA: Diagnosis not present

## 2011-11-21 NOTE — Assessment & Plan Note (Signed)
stable overall by hx and exam, most recent data reviewed with pt, and pt to continue medical treatment as before  Lab Results  Component Value Date   WBC 6.0 05/24/2011   HGB 14.9 05/24/2011   HCT 45.3 05/24/2011   PLT 271.0 05/24/2011   GLUCOSE 94 07/14/2011   CHOL 130 05/24/2011   TRIG 63.0 05/24/2011   HDL 59.00 05/24/2011   LDLDIRECT 122.2 02/09/2010   LDLCALC 58 05/24/2011   ALT 20 05/24/2011   AST 19 05/24/2011   NA 141 07/14/2011   K 3.6 07/14/2011   CL 102 07/14/2011   CREATININE 0.8 07/14/2011   BUN 13 07/14/2011   CO2 30 07/14/2011   TSH 1.24 05/24/2011

## 2011-11-21 NOTE — Assessment & Plan Note (Signed)
stable overall by hx and exam, most recent data reviewed with pt, and pt to continue medical treatment as before BP Readings from Last 3 Encounters:  11/21/11 120/70  11/07/11 134/70  07/14/11 128/74

## 2011-11-21 NOTE — Progress Notes (Signed)
Subjective:    Patient ID: Crystal Herrera, female    DOB: 1940/12/12, 71 y.o.   MRN: [id number]  HPI here to f/u with hx of 2 months lower lumbar/sacral pain, now 8/10 most days/severe, seemed to start initially after a rather vigorous sounding "jack-knife" dive in the pool for a nickel 2 mo ago, but no bowel or bladder change, fever, wt loss,  worsening LE pain/numbness/weakness, gait change or falls,e xcept for some radiation of pain to the left inner thigh on occasion, similar to pain she has had in the past before a laminectomy remotely.  Has hx of lumbar disc dz.  Saw an accupuncturist once 2 mo ago, then twice again last wk with some improvement temporarily but just does not seem to resolve.  Pain worse to sit for over 1 hr, and has held off on her usual lap swimming 30 min 3 times wkly as well, and even less walking 3 times per wk for 40 min.  Denies worsening depressive symptoms, suicidal ideation, or panic. Past Medical History  Diagnosis Date  . HYPERLIPIDEMIA 02/09/2010  . DEPRESSION 02/09/2010  . COMMON MIGRAINE 02/09/2010  . HYPERTENSION 02/09/2010  . GERD 02/09/2010  . SKIN LESION 03/22/2010  . DEGENERATIVE JOINT DISEASE 02/09/2010  . OCCIPITAL NEURALGIA 06/09/2010  . BACK PAIN, CHRONIC 02/09/2010  . OSTEOPENIA 02/09/2010  . FATIGUE 02/09/2010  . Diarrhea 04/26/2010  . COLONIC POLYPS, HX OF 02/09/2010  . Lumbar disc disease    Past Surgical History  Procedure Date  . Tubal ligation   . Chymopapain injection 1986    Lumbar  . Lumbar laminectomy 1986  . Ovarian cyst removal 2006  . Varicose vein removed 2008  . Cholecystectomy   . Breast surgery 1988    breast biopsy-Neg    reports that she has quit smoking. Her smoking use included Cigarettes. She has never used smokeless tobacco. She reports that she does not drink alcohol or use illicit drugs. family history includes Breast cancer in her maternal grandmother; Colon cancer in her maternal aunt; Diabetes in her father; and Lung  cancer in her mother. Allergies  Allergen Reactions  . Penicillins   . Sulfonamide Derivatives    Review of Systems Review of Systems  Constitutional: Negative for diaphoresis and unexpected weight change.  Eyes: Negative for photophobia and visual disturbance.  Respiratory: Negative for choking and stridor.   Gastrointestinal: Negative for vomiting and blood in stool.  Genitourinary: Negative for hematuria and decreased urine volume.  Skin: Negative for color change and wound.  Neurological: Negative for tremors and numbness.  Psychiatric/Behavioral: Negative for decreased concentration. The patient is not hyperactive.       Objective:   Physical Exam BP 120/70  Pulse 72  Temp(Src) 97.8 F (36.6 C) (Oral)  Ht 5\' 5"  (1.651 m)  Wt 150 lb 6 oz (68.21 kg)  BMI 25.02 kg/m2  SpO2 97% Physical Exam  VS noted Constitutional: Pt appears well-developed and well-nourished.  HENT: Head: Normocephalic.  Right Ear: External ear normal.  Left Ear: External ear normal.  Eyes: Conjunctivae and EOM are normal. Pupils are equal, round, and reactive to light.  Neck: Normal range of motion. Neck supple.  Cardiovascular: Normal rate and regular rhythm.   Pulmonary/Chest: Effort normal and breath sounds normal.  Abd:  Soft, NT, non-distended, + BS Neurological: Pt is alert. No cranial nerve deficit. motor/sens/dtr intact to LE's, gait ok Skin: Skin is warm. No erythema.  Psychiatric: Pt behavior is normal. Thought content normal. not depressed  affect , only mild nervous Spine: nontender throughout including the sacral area and bilat SI areas    Assessment & Plan:

## 2011-11-21 NOTE — Assessment & Plan Note (Addendum)
Subacute on chroinc with hx of laminectomy, and 2 mo gradually increased pain to low lumbar/sacral area without radiculopathy, but with worsening now severe pain that is activity limiting - for MRI and ortho referral, Continue all other medications as before - declines further pain med due to concern of side effect

## 2011-11-21 NOTE — Patient Instructions (Addendum)
Continue all other medications as before You will be contacted regarding the referral for: MRI for the lower back, and orthopedic referral Please keep your appointments with your specialists as you have planned - Dr Christella Hartigan Please return in 6 months, or sooner if needed

## 2011-11-25 ENCOUNTER — Other Ambulatory Visit: Payer: Self-pay

## 2011-11-25 MED ORDER — CARISOPRODOL 350 MG PO TABS: 350.0000 mg | ORAL_TABLET | Freq: Two times a day (BID) | ORAL | Status: DC

## 2011-11-25 NOTE — Telephone Encounter (Signed)
Faxed hardcopy to pharmacy. 

## 2011-11-25 NOTE — Telephone Encounter (Signed)
Done hardcopy to robin  

## 2011-11-26 ENCOUNTER — Ambulatory Visit
Admission: RE | Admit: 2011-11-26 | Discharge: 2011-11-26 | Disposition: A | Discharge status: Home / Self Care | Payer: Medicare Other | Source: Ambulatory Visit | Attending: Internal Medicine | Admitting: Internal Medicine

## 2011-11-26 DIAGNOSIS — M5126 Other intervertebral disc displacement, lumbar region: Secondary | ICD-10-CM | POA: Diagnosis not present

## 2011-11-26 DIAGNOSIS — M545 Low back pain: Secondary | ICD-10-CM

## 2011-11-26 DIAGNOSIS — M47817 Spondylosis without myelopathy or radiculopathy, lumbosacral region: Secondary | ICD-10-CM | POA: Diagnosis not present

## 2011-11-26 DIAGNOSIS — M5137 Other intervertebral disc degeneration, lumbosacral region: Secondary | ICD-10-CM | POA: Diagnosis not present

## 2011-11-27 ENCOUNTER — Other Ambulatory Visit: Payer: Medicare Other

## 2011-11-28 ENCOUNTER — Encounter: Payer: Self-pay | Admitting: Internal Medicine

## 2011-12-08 DIAGNOSIS — M47817 Spondylosis without myelopathy or radiculopathy, lumbosacral region: Secondary | ICD-10-CM | POA: Diagnosis not present

## 2011-12-08 DIAGNOSIS — IMO0002 Reserved for concepts with insufficient information to code with codable children: Secondary | ICD-10-CM | POA: Diagnosis not present

## 2011-12-13 ENCOUNTER — Ambulatory Visit: Payer: Medicare Other | Admitting: Gastroenterology

## 2011-12-14 DIAGNOSIS — M545 Low back pain: Secondary | ICD-10-CM | POA: Diagnosis not present

## 2011-12-14 DIAGNOSIS — M5137 Other intervertebral disc degeneration, lumbosacral region: Secondary | ICD-10-CM | POA: Diagnosis not present

## 2011-12-21 DIAGNOSIS — M545 Low back pain: Secondary | ICD-10-CM | POA: Diagnosis not present

## 2011-12-21 DIAGNOSIS — M5137 Other intervertebral disc degeneration, lumbosacral region: Secondary | ICD-10-CM | POA: Diagnosis not present

## 2011-12-23 DIAGNOSIS — M5137 Other intervertebral disc degeneration, lumbosacral region: Secondary | ICD-10-CM | POA: Diagnosis not present

## 2011-12-23 DIAGNOSIS — M545 Low back pain: Secondary | ICD-10-CM | POA: Diagnosis not present

## 2011-12-28 DIAGNOSIS — M545 Low back pain: Secondary | ICD-10-CM | POA: Diagnosis not present

## 2011-12-28 DIAGNOSIS — M47817 Spondylosis without myelopathy or radiculopathy, lumbosacral region: Secondary | ICD-10-CM | POA: Diagnosis not present

## 2011-12-28 DIAGNOSIS — M5137 Other intervertebral disc degeneration, lumbosacral region: Secondary | ICD-10-CM | POA: Diagnosis not present

## 2011-12-29 DIAGNOSIS — M542 Cervicalgia: Secondary | ICD-10-CM | POA: Diagnosis not present

## 2011-12-29 DIAGNOSIS — H9209 Otalgia, unspecified ear: Secondary | ICD-10-CM | POA: Diagnosis not present

## 2011-12-29 DIAGNOSIS — H9319 Tinnitus, unspecified ear: Secondary | ICD-10-CM | POA: Diagnosis not present

## 2012-01-04 DIAGNOSIS — M5137 Other intervertebral disc degeneration, lumbosacral region: Secondary | ICD-10-CM | POA: Diagnosis not present

## 2012-01-04 DIAGNOSIS — M47817 Spondylosis without myelopathy or radiculopathy, lumbosacral region: Secondary | ICD-10-CM | POA: Diagnosis not present

## 2012-01-04 DIAGNOSIS — M545 Low back pain: Secondary | ICD-10-CM | POA: Diagnosis not present

## 2012-01-06 ENCOUNTER — Telehealth: Payer: Self-pay | Admitting: *Deleted

## 2012-01-06 NOTE — Telephone Encounter (Signed)
Called pt back inform pt Dr. Jonny Ruiz doesn't do cortisol injection. ? If she contacted her ortho md she states she did, but he doesn't do them. Transferred pt to make appt to see Dr. Debby Bud.Marland KitchenMarland Kitchen6/14/13@10 :00am/LMB

## 2012-01-06 NOTE — Telephone Encounter (Signed)
Left msg on triage been having some wrist pain & wanting to get cortisol injection. Requesting call bck from nurse... 01/06/12@8 :55am/LMB

## 2012-01-10 ENCOUNTER — Ambulatory Visit (INDEPENDENT_AMBULATORY_CARE_PROVIDER_SITE_OTHER): Payer: Medicare Other | Admitting: Internal Medicine

## 2012-01-10 ENCOUNTER — Encounter: Payer: Self-pay | Admitting: Internal Medicine

## 2012-01-10 VITALS — BP 124/80 | HR 80 | Temp 97.9°F | Resp 16 | Ht 65.0 in | Wt 149.0 lb

## 2012-01-10 DIAGNOSIS — M1990 Unspecified osteoarthritis, unspecified site: Secondary | ICD-10-CM | POA: Diagnosis not present

## 2012-01-10 MED ORDER — DICLOFENAC SODIUM 1 % TD GEL: 1.0000 "application " | Freq: Four times a day (QID) | TRANSDERMAL | Status: DC

## 2012-01-10 NOTE — Patient Instructions (Addendum)
Pain in the wrist - difficult to differentiate between tendonitis vs arthritic change at the thumb. Plan - topical anti-inflammatory = diclofenac gel applying 2 g 4 times a day. Fore interim use Pennsaid apply 20 drops to the wrist and work it in.

## 2012-01-11 NOTE — Progress Notes (Signed)
Subjective:    Patient ID: Crystal Herrera, female    DOB: [birth year], 71 y.o.   MRN: [id number]  HPI Crystal Herrera presents for evaluation of a painful right wrist. She reports that several months ago she had pain while in [facility] and was treated with a steroid injection. She did well for several months but for the last month has had pain. She points to the 1st MCP joint right hand. She has good hand function, minimal morning gel and no neuro symptoms.  Past Medical History  Diagnosis Date  . HYPERLIPIDEMIA 02/09/2010  . DEPRESSION 02/09/2010  . COMMON MIGRAINE 02/09/2010  . HYPERTENSION 02/09/2010  . GERD 02/09/2010  . SKIN LESION 03/22/2010  . DEGENERATIVE JOINT DISEASE 02/09/2010  . OCCIPITAL NEURALGIA 06/09/2010  . BACK PAIN, CHRONIC 02/09/2010  . OSTEOPENIA 02/09/2010  . FATIGUE 02/09/2010  . Diarrhea 04/26/2010  . COLONIC POLYPS, HX OF 02/09/2010  . Lumbar disc disease    Past Surgical History  Procedure Date  . Tubal ligation   . Chymopapain injection 1986    Lumbar  . Lumbar laminectomy 1986  . Ovarian cyst removal 2006  . Varicose vein removed 2008  . Cholecystectomy   . Breast surgery 1988    breast biopsy-Neg   Family History  Problem Relation Age of Onset  . Lung cancer Mother   . Diabetes Father   . Breast cancer Maternal Grandmother   . Colon cancer Maternal Aunt    History   Social History  . Marital Status: Married    Spouse Name: N/A    Number of Children: N/A  . Years of Education: N/A   Occupational History  . Not on file.   Social History Main Topics  . Smoking status: Former Smoker    Types: Cigarettes  . Smokeless tobacco: Never Used  . Alcohol Use: No  . Drug Use: No  . Sexually Active: Not on file   Other Topics Concern  . Not on file   Social History Narrative   Coffee daily     Current Outpatient Prescriptions on File Prior to Visit  Medication Sig Dispense Refill  . acetaminophen (TYLENOL) 325 MG tablet Take 650 mg by  mouth every 6 (six) hours as needed.        . calcium gluconate 500 MG tablet Take 500 mg by mouth 2 (two) times daily.        . carisoprodol (SOMA) 350 MG tablet Take 1 tablet (350 mg total) by mouth 2 (two) times daily.  180 tablet  1  . DULoxetine (CYMBALTA) 60 MG capsule Take 1 capsule (60 mg total) by mouth daily.  90 capsule  2  . esomeprazole (NEXIUM) 40 MG capsule Take 1 capsule (40 mg total) by mouth daily before breakfast.  30 capsule  11  . glucosamine-chondroitin 500-400 MG tablet Take 1 tablet by mouth 2 (two) times daily.        . hydrochlorothiazide (MICROZIDE) 12.5 MG capsule Take 1 capsule (12.5 mg total) by mouth daily.  90 capsule  2  . simvastatin (ZOCOR) 20 MG tablet Take 1 tablet (20 mg total) by mouth at bedtime.  90 tablet  2  . sucralfate (CARAFATE) 1 GM/10ML suspension Take 10 mLs (1 g total) by mouth 4 (four) times daily.  420 mL  1  . DISCONTD: esomeprazole (NEXIUM) 40 MG capsule Take 1 capsule (40 mg total) by mouth daily.  90 capsule  3      Review of Systems System  review is negative for any constitutional, cardiac, pulmonary, GI or neuro symptoms or complaints other than as described in the HPI.     Objective:   Physical Exam Filed Vitals:   01/10/12 1622  BP: 124/80  Pulse: 80  Temp: 97.9 F (36.6 C)  Resp: 16   WNWD white woman in no distress HEENT- [place]/AT, C&S clear Cor- RRR PUlm - normal respirations MSK - heberdeens nodes both hands worse on the left with mild DIP deformity 3rd finger left. Right wrist with good range of motion. Negative Finkelstein's manuerver. No tenderness at the anatomical snuff box. Very tender at the 1st MCP joint right.      Assessment & Plan:

## 2012-01-11 NOTE — Assessment & Plan Note (Signed)
Exam c/w OA inflammation at the 1st MCP joint right. She is intolerant of oral NSAIDs  Plan -  voltaren gel, 2 g qid to sore joint.  Immerse hand in heat - warm water or heated bead bath.

## 2012-01-12 DIAGNOSIS — M5137 Other intervertebral disc degeneration, lumbosacral region: Secondary | ICD-10-CM | POA: Diagnosis not present

## 2012-01-12 DIAGNOSIS — M47817 Spondylosis without myelopathy or radiculopathy, lumbosacral region: Secondary | ICD-10-CM | POA: Diagnosis not present

## 2012-02-09 DIAGNOSIS — H2510 Age-related nuclear cataract, unspecified eye: Secondary | ICD-10-CM | POA: Diagnosis not present

## 2012-02-13 DIAGNOSIS — R21 Rash and other nonspecific skin eruption: Secondary | ICD-10-CM | POA: Diagnosis not present

## 2012-03-12 DIAGNOSIS — M533 Sacrococcygeal disorders, not elsewhere classified: Secondary | ICD-10-CM | POA: Diagnosis not present

## 2012-03-15 ENCOUNTER — Telehealth: Payer: Self-pay | Admitting: Gastroenterology

## 2012-03-15 NOTE — Telephone Encounter (Signed)
Pt on Nexium BID and Carafate QID and still continues to experience epigastric pain.  This is a long term problem that has gotten NO better but not any worse.  She is following reflux precautions.  Pt given appt for 04/09/12.  Is there anything further she can do before appt?

## 2012-03-16 NOTE — Telephone Encounter (Signed)
Pt returned call and will keep appt for 04/09/12

## 2012-03-16 NOTE — Telephone Encounter (Signed)
Will have to discuss her symptoms again in office to decide on next step

## 2012-03-16 NOTE — Telephone Encounter (Signed)
Left message on machine to call back  

## 2012-03-22 DIAGNOSIS — M542 Cervicalgia: Secondary | ICD-10-CM | POA: Diagnosis not present

## 2012-03-22 DIAGNOSIS — G542 Cervical root disorders, not elsewhere classified: Secondary | ICD-10-CM | POA: Diagnosis not present

## 2012-04-02 DIAGNOSIS — Z1231 Encounter for screening mammogram for malignant neoplasm of breast: Secondary | ICD-10-CM | POA: Diagnosis not present

## 2012-04-09 ENCOUNTER — Telehealth: Payer: Self-pay

## 2012-04-09 ENCOUNTER — Ambulatory Visit (INDEPENDENT_AMBULATORY_CARE_PROVIDER_SITE_OTHER): Payer: Medicare Other | Admitting: Gastroenterology

## 2012-04-09 ENCOUNTER — Encounter: Payer: Self-pay | Admitting: Gastroenterology

## 2012-04-09 VITALS — BP 128/76 | HR 60 | Ht 65.0 in | Wt 146.0 lb

## 2012-04-09 DIAGNOSIS — K469 Unspecified abdominal hernia without obstruction or gangrene: Secondary | ICD-10-CM

## 2012-04-09 NOTE — Progress Notes (Signed)
Review of gastrointestinal problems:  1. Acute diarrheal illness, likely infectious. Symptoms improved over about 4 weeks. October 2011. CBC, complete metabolic profile, celiac sprue testing, sedimentation rate all negative. Stool tests for usual infectious etiologies were all negative. Colonoscopy out of state in [place] 2-3 years prior was normal per patient. 11/12 this improved and never returned.  2. abdominal pain, burning : EGD November 2012 4 abdominal pain, burning found mild gastritis, H. pylori biopsies were negative. Cbc, lfts normal 10/12. CT scan 12.12 showed ventral hernia with fat only, otherwise normal.  Started carafate 09/2011.  HPI: This is a very pleasant 71 year old woman who is here with her husband today. I last saw her several months ago.   She has been on carafate, takes PRN.  Feels a burning pain in epigastricum. Will last for 2 days to 4-5 days.  Takes nexium 30-60 min before BF.  Carafate as needed.  In a month she has 10 good days, 20 bad days.  Sometimes she will have to push on her abdomen to make it feel better. She also has a small hernia midline that can be tender to touch at times.  Fried foods, chocolate, tomatoes make it worse clearly.    Past Medical History  Diagnosis Date  . HYPERLIPIDEMIA 02/09/2010  . DEPRESSION 02/09/2010  . COMMON MIGRAINE 02/09/2010  . HYPERTENSION 02/09/2010  . GERD 02/09/2010  . SKIN LESION 03/22/2010  . DEGENERATIVE JOINT DISEASE 02/09/2010  . OCCIPITAL NEURALGIA 06/09/2010  . BACK PAIN, CHRONIC 02/09/2010  . OSTEOPENIA 02/09/2010  . FATIGUE 02/09/2010  . Diarrhea 04/26/2010  . COLONIC POLYPS, HX OF 02/09/2010  . Lumbar disc disease     Past Surgical History  Procedure Date  . Tubal ligation   . Chymopapain injection 1986    Lumbar  . Lumbar laminectomy 1986  . Ovarian cyst removal 2006  . Varicose vein removed 2008  . Cholecystectomy   . Breast surgery 1988    breast biopsy-Neg    Current Outpatient Prescriptions    Medication Sig Dispense Refill  . acetaminophen (TYLENOL) 325 MG tablet Take 650 mg by mouth every 6 (six) hours as needed.        . calcium gluconate 500 MG tablet Take 500 mg by mouth 2 (two) times daily.        . carisoprodol (SOMA) 350 MG tablet Take 1 tablet (350 mg total) by mouth 2 (two) times daily.  180 tablet  1  . diclofenac sodium (VOLTAREN) 1 % GEL Apply 1 application topically 4 (four) times daily.  9 Tube  11  . DULoxetine (CYMBALTA) 60 MG capsule Take 1 capsule (60 mg total) by mouth daily.  90 capsule  2  . esomeprazole (NEXIUM) 40 MG capsule Take 1 capsule (40 mg total) by mouth daily before breakfast.  30 capsule  11  . glucosamine-chondroitin 500-400 MG tablet Take 1 tablet by mouth 2 (two) times daily.        . hydrochlorothiazide (MICROZIDE) 12.5 MG capsule Take 1 capsule (12.5 mg total) by mouth daily.  90 capsule  2  . simvastatin (ZOCOR) 20 MG tablet Take 1 tablet (20 mg total) by mouth at bedtime.  90 tablet  2  . sucralfate (CARAFATE) 1 GM/10ML suspension Take 10 mLs (1 g total) by mouth 4 (four) times daily.  420 mL  1  . DISCONTD: esomeprazole (NEXIUM) 40 MG capsule Take 1 capsule (40 mg total) by mouth daily.  90 capsule  3  Allergies as of 04/09/2012 - Review Complete 04/09/2012  Allergen Reaction Noted  . Penicillins  02/09/2010  . Sulfonamide derivatives  02/09/2010    Family History  Problem Relation Age of Onset  . Lung cancer Mother   . Diabetes Father   . Breast cancer Maternal Grandmother   . Colon cancer Maternal Aunt     History   Social History  . Marital Status: Married    Spouse Name: N/A    Number of Children: N/A  . Years of Education: N/A   Occupational History  . Not on file.   Social History Main Topics  . Smoking status: Former Smoker    Types: Cigarettes  . Smokeless tobacco: Never Used  . Alcohol Use: No  . Drug Use: No  . Sexually Active: Not on file   Other Topics Concern  . Not on file   Social History  Narrative   Coffee daily       Physical Exam: BP 128/76  Pulse 60  Ht 5\' 5"  (1.651 m)  Wt 146 lb (66.225 kg)  BMI 24.30 kg/m2 Constitutional: generally well-appearing Psychiatric: alert and oriented x3 Abdomen: soft, nontender, nondistended, no obvious ascites, no peritoneal signs, normal bowel sounds; small midline hernia which is reducible, very slightly tender to touch     Assessment and plan: 71 y.o. female with midline ventral hernia which may be causing her burning, abdominal pains  Proton pump inhibitor does not seem like it is helping, Carafate does not seem like it's helping. I am getting more suspicious that her midline hernia, noted on CT scan last December, could be causing some of her discomfort. I am going to send her to a general surgeon to consider elective repair of that hernia. She is going to stop her Carafate and wean off her Nexium. Of note she tells me that Nexium has never really helped, and the burning she has is unlike burning from a more typical heartburn which she has had previously.

## 2012-04-09 NOTE — Patient Instructions (Addendum)
Stop carafate since it isn't helping you feel better. Refer to general surgeon to consider elective repair of ventral hernia (which MAY be causing your symptoms).  [facility] will contact you with appointment.  If you have not heard from them in about a week please call [phone].   Taper the nexium to off over about 2 weeks. Take OTC omeprazole (prilosec, prevacid) as needed for heartburn.

## 2012-04-09 NOTE — Telephone Encounter (Signed)
blanca to notify pt  

## 2012-04-09 NOTE — Telephone Encounter (Signed)
Message copied by Donata Duff on Mon Apr 09, 2012 12:16 PM ------      Message from: Marnette Burgess      Created: Mon Apr 09, 2012 11:21 AM       Patient is scheduled to see Dr. Harriette Bouillon on 04/20/12 @ 11:00am, arrive @ 10:30am.  If you have any questions please call [phone].            Thank You,      Elane Fritz      ----- Message -----         From: Donata Duff, CMA         Sent: 04/09/2012  10:12 AM           To: Marnette Burgess            Pt needs appt to discuss elective repair of ventral hernia

## 2012-04-20 ENCOUNTER — Ambulatory Visit (INDEPENDENT_AMBULATORY_CARE_PROVIDER_SITE_OTHER): Payer: Medicare Other | Admitting: Surgery

## 2012-04-27 ENCOUNTER — Encounter (INDEPENDENT_AMBULATORY_CARE_PROVIDER_SITE_OTHER): Payer: Self-pay | Admitting: General Surgery

## 2012-04-27 ENCOUNTER — Ambulatory Visit (INDEPENDENT_AMBULATORY_CARE_PROVIDER_SITE_OTHER): Payer: Medicare Other | Admitting: General Surgery

## 2012-04-27 VITALS — BP 152/78 | HR 56 | Temp 98.8°F | Resp 16 | Ht 65.0 in | Wt 150.0 lb

## 2012-04-27 DIAGNOSIS — K439 Ventral hernia without obstruction or gangrene: Secondary | ICD-10-CM | POA: Insufficient documentation

## 2012-04-27 NOTE — Progress Notes (Signed)
Patient ID: Crystal Herrera, female   DOB: 01/21/1941, 71 y.o.   MRN: [id number]  Chief Complaint  Patient presents with  . Pre-op Exam    eval [facility]    HPI Crystal Herrera is a 71 y.o. female.   HPI 71-year-old Caucasian female was referred by Dr. Jacobs for evaluation of a ventral hernia. She states her only prior abdominal surgeries have been a laparoscopic cholecystectomy as well as a laparoscopic removal of an ovarian cyst. She states that she developed abdominal pain a few years ago. She describes it as a sensation of being uncomfortable in her upper midline area. She states that occasionally there'll be burning. She states that for a long time the discomfort was pretty much constant however she hasn't had much discomfort since she saw Dr. Jacobs a few weeks ago. When she initially saw him about a year ago he placed her on Nexium. However she states that she cannot tell any difference in her discomfort after taking the Nexium and being compliant with it. She denies any vomiting. She denies any diarrhea or constipation. She denies any melena or hematochezia. She states that she has eliminated all fried and greasy foods from her diet. She states that generally spicy foods or tomato based foods will cause a worsening discomfort Past Medical History  Diagnosis Date  . HYPERLIPIDEMIA 02/09/2010  . DEPRESSION 02/09/2010  . COMMON MIGRAINE 02/09/2010  . HYPERTENSION 02/09/2010  . GERD 02/09/2010  . SKIN LESION 03/22/2010  . DEGENERATIVE JOINT DISEASE 02/09/2010  . OCCIPITAL NEURALGIA 06/09/2010  . BACK PAIN, CHRONIC 02/09/2010  . OSTEOPENIA 02/09/2010  . FATIGUE 02/09/2010  . Diarrhea 04/26/2010  . COLONIC POLYPS, HX OF 02/09/2010  . Lumbar disc disease     Past Surgical History  Procedure Date  . Tubal ligation   . Chymopapain injection 1986    Lumbar  . Lumbar laminectomy 1986  . Ovarian cyst removal 2006  . Varicose vein removed 2008  . Cholecystectomy   . Breast surgery 1988    breast  biopsy-Neg    Family History  Problem Relation Age of Onset  . Lung cancer Mother   . Diabetes Father   . Breast cancer Maternal Grandmother   . Cancer Maternal Grandmother     breast  . Colon cancer Maternal Aunt     Social History History  Substance Use Topics  . Smoking status: Former Smoker    Types: Cigarettes  . Smokeless tobacco: Never Used  . Alcohol Use: No    Allergies  Allergen Reactions  . Penicillins   . Sulfonamide Derivatives     Current Outpatient Prescriptions  Medication Sig Dispense Refill  . acetaminophen (TYLENOL) 325 MG tablet Take 650 mg by mouth every 6 (six) hours as needed.        . calcium gluconate 500 MG tablet Take 500 mg by mouth 2 (two) times daily.        . DULoxetine (CYMBALTA) 60 MG capsule Take 1 capsule (60 mg total) by mouth daily.  90 capsule  2  . esomeprazole (NEXIUM) 40 MG capsule Take 1 capsule (40 mg total) by mouth daily before breakfast.  30 capsule  11  . glucosamine-chondroitin 500-400 MG tablet Take 1 tablet by mouth 2 (two) times daily.        . hydrochlorothiazide (MICROZIDE) 12.5 MG capsule Take 1 capsule (12.5 mg total) by mouth daily.  90 capsule  2  . simvastatin (ZOCOR) 20 MG tablet Take 1 tablet (20   mg total) by mouth at bedtime.  90 tablet  2  . carisoprodol (SOMA) 350 MG tablet Take 1 tablet (350 mg total) by mouth 2 (two) times daily.  180 tablet  1  . diclofenac sodium (VOLTAREN) 1 % GEL Apply 1 application topically 4 (four) times daily.  9 Tube  11  . DISCONTD: esomeprazole (NEXIUM) 40 MG capsule Take 1 capsule (40 mg total) by mouth daily.  90 capsule  3    Review of Systems Review of Systems  Constitutional: Negative for fever, chills, activity change, appetite change and unexpected weight change.  HENT: Negative for hearing loss, nosebleeds and neck pain.   Eyes: Negative for photophobia and visual disturbance.  Respiratory: Negative for chest tightness, shortness of breath and wheezing.     Cardiovascular: Negative for chest pain, palpitations and leg swelling.       Denies cp, sob, pnd, orthopnea  Gastrointestinal:       See hpi  Genitourinary: Negative for dysuria, hematuria and difficulty urinating.  Musculoskeletal: Negative for joint swelling and arthralgias.  Skin: Negative for pallor and rash.  Neurological: Negative for tremors, seizures, syncope, speech difficulty and headaches.       Denies TIA's, amaurosis fugax  Hematological: Negative for adenopathy. Does not bruise/bleed easily.  Psychiatric/Behavioral: Negative for suicidal ideas, behavioral problems and decreased concentration.    Blood pressure 152/78, pulse 56, temperature 98.8 F (37.1 C), temperature source Temporal, resp. rate 16, height 5' 5" (1.651 m), weight 150 lb (68.04 kg).  Physical Exam Physical Exam  Vitals reviewed. Constitutional: She is oriented to person, place, and time. She appears well-developed and well-nourished. No distress.  HENT:  Head: Normocephalic and atraumatic.  Right Ear: External ear normal.  Left Ear: External ear normal.  Eyes: Conjunctivae normal are normal. No scleral icterus.  Neck: Normal range of motion. Neck supple. No tracheal deviation present. No thyromegaly present.  Cardiovascular: Normal rate, regular rhythm and normal heart sounds.   Pulmonary/Chest: Effort normal and breath sounds normal. No respiratory distress. She has no wheezes.  Abdominal: Soft. Bowel sounds are normal. She exhibits no distension. There is no tenderness. There is no rebound. A hernia is present. Hernia confirmed positive in the ventral area.         Some scattered teleangiectasia's on upper abd wall; soft, reducible supraumbilical hernia about 2.5-3cm defect  Musculoskeletal: Normal range of motion. She exhibits no edema and no tenderness.  Neurological: She is alert and oriented to person, place, and time.  Skin: Skin is warm and dry. No rash noted. She is not diaphoretic. No  erythema.  Psychiatric: She has a normal mood and affect. Her behavior is normal. Judgment and thought content normal.    Data Reviewed Dr Jacob's note CT a/p 06/2011  CT ABDOMEN AND PELVIS WITH CONTRAST  Technique: Multidetector CT imaging of the abdomen and pelvis was  performed following the standard protocol during bolus  administration of intravenous contrast.  Contrast: 100 ml of omni 300  Comparison: None  Findings: The lung bases appear clear.  No pericardial or pleural effusion identified.  Prior cholecystectomy. No biliary dilatation. The pancreas  appears normal.  Calcified granulomas within the splenic parenchyma.  No suspicious liver abnormality.  Normal appearance of both kidneys.  No upper abdominal adenopathy.  There is no pelvic or inguinal adenopathy identified. Urinary  bladder appears normal. The uterus and adnexal structures are  negative.  There is a normal appearance of the stomach.  The small bowel loops   appear normal.  Appendix is identified and appears normal. Normal appearance of the  colon. There is a supraumbilical ventral abdominal wall hernia  which contains fat only.  Review of the visualized osseous structures is significant for mild  degenerative disc disease.   IMPRESSION:  1. No acute findings identified within the abdomen or pelvis.  2. Prior cholecystectomy.  3. Ventral abdominal wall hernia which contains fat only.   Assessment    Ventral incisional hernia    Plan    We discussed the etiology of ventral hernias. We discussed the signs and symptoms of incarceration and strangulation. The patient was given educational material. I also drew diagrams.  We discussed nonoperative and operative management. With respect to operative management, we discussed both open repair and laparoscopic repair. We discussed the pros and cons of each approach. I discussed the typical aftercare with each procedure and how each procedure differs.  The  patient is leaning toward laparoscopic ventral hernia repair with mesh  We discussed the risk and benefits of surgery including but not limited to bleeding, infection, injury to surrounding structures, hernia recurrence, mesh complications, hematoma/seroma formation, need to convert to an open procedure, blood clot formation, urinary retention, post operative ileus, general anesthesia risk, long-term abdominal pain. We discussed that this procedure can be quite uncomfortable and difficult to recover from based on how the mesh is secured to the abdominal wall. We discussed the importance of avoiding heavy lifting and straining for a period of 6 weeks.  I explained to the patient and her husband that I could not guarantee that repairing her hernia would make any difference with respect to her intolerance to certain foods like spicy or tomato based foods   The patient is leaning toward surgery however she wants to think about it for a few days. I instructed the patient to contact our office if she has any additional questions or if she would like to go ahead and schedule surgery.  Pride Gonzales M. Taylour Lietzke, MD, FACS General, Bariatric, & Minimally Invasive Surgery [facility]         Jaivyn Gulla M 04/27/2012, 12:44 PM    

## 2012-04-27 NOTE — Patient Instructions (Signed)
Call our office to schedule your surgery [phone]  Hernia Repair with Laparoscope A hernia occurs when an internal organ pushes out through a weak spot in the belly (abdominal) wall muscles. Hernias most commonly occur in the groin and around the navel. Hernias can also occur through a cut by the surgeon (incision) after an abdominal operation. A hernia may be caused by:  Lifting heavy objects.  Prolonged coughing.  Straining to move your bowels. Hernias can often be pushed back into place (reduced). Most hernias tend to get worse over time. Problems occur when abdominal contents get stuck in the opening and the blood supply is blocked or impaired (incarcerated hernia). Because of these risks, you require surgery to repair the hernia. Your hernia will be repaired using a laparoscope. Laparoscopic surgery is a type of minimally invasive surgery. It does not involve making a typical surgical cut (incision) in the skin. A laparoscope is a telescope-like rod and lens system. It is usually connected to a video camera and a light source so your caregiver can clearly see the operative area. The instruments are inserted through  to  inch (5 mm or 10 mm) openings in the skin at specific locations. A working and viewing space is created by blowing a small amount of carbon dioxide gas into the abdominal cavity. The abdomen is essentially blown up like a balloon (insufflated). This elevates the abdominal wall above the internal organs like a dome. The carbon dioxide gas is common to the human body and can be absorbed by tissue and removed by the respiratory system. Once the repair is completed, the small incisions will be closed with either stitches (sutures) or staples (just like a paper stapler only this staple holds the skin together). LET YOUR CAREGIVERS KNOW ABOUT:  Allergies.  Medications taken including herbs, eye drops, over the counter medications, and creams.  Use of steroids (by mouth or  creams).  Previous problems with anesthetics or Novocaine.  Possibility of pregnancy, if this applies.  History of blood clots (thrombophlebitis).  History of bleeding or blood problems.  Previous surgery.  Other health problems. BEFORE THE PROCEDURE  Laparoscopy can be done either in a hospital or out-patient clinic. You may be given a mild sedative to help you relax before the procedure. Once in the operating room, you will be given a general anesthesia to make you sleep (unless you and your caregiver choose a different anesthetic).  AFTER THE PROCEDURE  After the procedure you will be watched in a recovery area. Depending on what type of hernia was repaired, you might be admitted to the hospital or you might go home the same day. With this procedure you may have less pain and scarring. This usually results in a quicker recovery and less risk of infection. HOME CARE INSTRUCTIONS   Bed rest is not required. You may continue your normal activities but avoid heavy lifting (more than 10 pounds) or straining.  Cough gently. If you are a smoker it is best to stop, as even the best hernia repair can break down with the continual strain of coughing.  Avoid driving until given the OK by your surgeon.  There are no dietary restrictions unless given otherwise.  TAKE ALL MEDICATIONS AS DIRECTED.  Only take over-the-counter or prescription medicines for pain, discomfort, or fever as directed by your caregiver. SEEK MEDICAL CARE IF:   There is increasing abdominal pain or pain in your incisions.  There is more bleeding from incisions, other than minimal spotting.  You feel light headed or faint.  You develop an unexplained fever, chills, and/or an oral temperature above 102 F (38.9 C).  You have redness, swelling, or increasing pain in the wound.  Pus coming from wound.  A foul smell coming from the wound or dressings. SEEK IMMEDIATE MEDICAL CARE IF:   You develop a rash.  You  have difficulty breathing.  You have any allergic problems. MAKE SURE YOU:   Understand these instructions.  Will watch your condition.  Will get help right away if you are not doing well or get worse. Document Released: 07/11/2005 Document Revised: 10/03/2011 Document Reviewed: 06/10/2009 [facility] Patient Information 2013 [place], [place].

## 2012-04-30 DIAGNOSIS — H2510 Age-related nuclear cataract, unspecified eye: Secondary | ICD-10-CM | POA: Diagnosis not present

## 2012-05-03 DIAGNOSIS — M47817 Spondylosis without myelopathy or radiculopathy, lumbosacral region: Secondary | ICD-10-CM | POA: Diagnosis not present

## 2012-05-03 DIAGNOSIS — S20229A Contusion of unspecified back wall of thorax, initial encounter: Secondary | ICD-10-CM | POA: Diagnosis not present

## 2012-05-03 DIAGNOSIS — M5137 Other intervertebral disc degeneration, lumbosacral region: Secondary | ICD-10-CM | POA: Diagnosis not present

## 2012-05-04 ENCOUNTER — Encounter (HOSPITAL_COMMUNITY): Payer: Self-pay | Admitting: Pharmacy Technician

## 2012-05-04 ENCOUNTER — Other Ambulatory Visit (INDEPENDENT_AMBULATORY_CARE_PROVIDER_SITE_OTHER): Payer: Self-pay | Admitting: General Surgery

## 2012-05-10 ENCOUNTER — Ambulatory Visit (HOSPITAL_COMMUNITY)
Admission: RE | Admit: 2012-05-10 | Discharge: 2012-05-10 | Disposition: A | Discharge status: Home / Self Care | Payer: Medicare Other | Source: Ambulatory Visit | Attending: General Surgery | Admitting: General Surgery

## 2012-05-10 ENCOUNTER — Encounter (HOSPITAL_COMMUNITY)
Admission: RE | Admit: 2012-05-10 | Discharge: 2012-05-10 | Disposition: A | Discharge status: Home / Self Care | Payer: Medicare Other | Source: Ambulatory Visit | Attending: General Surgery | Admitting: General Surgery

## 2012-05-10 ENCOUNTER — Encounter (HOSPITAL_COMMUNITY): Payer: Self-pay

## 2012-05-10 DIAGNOSIS — Z01810 Encounter for preprocedural cardiovascular examination: Secondary | ICD-10-CM | POA: Insufficient documentation

## 2012-05-10 DIAGNOSIS — I10 Essential (primary) hypertension: Secondary | ICD-10-CM | POA: Diagnosis not present

## 2012-05-10 DIAGNOSIS — Z01811 Encounter for preprocedural respiratory examination: Secondary | ICD-10-CM | POA: Diagnosis not present

## 2012-05-10 DIAGNOSIS — Z01818 Encounter for other preprocedural examination: Secondary | ICD-10-CM | POA: Insufficient documentation

## 2012-05-10 DIAGNOSIS — Z01812 Encounter for preprocedural laboratory examination: Secondary | ICD-10-CM | POA: Diagnosis not present

## 2012-05-10 DIAGNOSIS — M47814 Spondylosis without myelopathy or radiculopathy, thoracic region: Secondary | ICD-10-CM | POA: Diagnosis not present

## 2012-05-10 HISTORY — DX: Myoneural disorder, unspecified: G70.9

## 2012-05-10 LAB — CBC WITH DIFFERENTIAL/PLATELET
Basophils Absolute: 0 10*3/uL (ref 0.0–0.1)
Basophils Relative: 1 % (ref 0–1)
Eosinophils Absolute: 0.1 10*3/uL (ref 0.0–0.7)
Eosinophils Relative: 2 % (ref 0–5)
HCT: 43.2 % (ref 36.0–46.0)
Hemoglobin: 14.3 g/dL (ref 12.0–15.0)
Lymphocytes Relative: 29 % (ref 12–46)
Lymphs Abs: 1.6 10*3/uL (ref 0.7–4.0)
MCH: 30.6 pg (ref 26.0–34.0)
MCHC: 33.1 g/dL (ref 30.0–36.0)
MCV: 92.3 fL (ref 78.0–100.0)
Monocytes Absolute: 0.6 10*3/uL (ref 0.1–1.0)
Monocytes Relative: 11 % (ref 3–12)
Neutro Abs: 3.2 10*3/uL (ref 1.7–7.7)
Neutrophils Relative %: 58 % (ref 43–77)
Platelets: 253 10*3/uL (ref 150–400)
RBC: 4.68 MIL/uL (ref 3.87–5.11)
RDW: 13.3 % (ref 11.5–15.5)
WBC: 5.6 10*3/uL (ref 4.0–10.5)

## 2012-05-10 LAB — BASIC METABOLIC PANEL
BUN: 18 mg/dL (ref 6–23)
CO2: 28 mEq/L (ref 19–32)
Calcium: 9.7 mg/dL (ref 8.4–10.5)
Chloride: 102 mEq/L (ref 96–112)
Creatinine, Ser: 0.71 mg/dL (ref 0.50–1.10)
GFR calc Af Amer: >90 mL/min (ref >90)
GFR calc non Af Amer: 84 mL/min — ABNORMAL LOW (ref >90)
Glucose, Bld: 98 mg/dL (ref 70–99)
Potassium: 4.1 mEq/L (ref 3.5–5.1)
Sodium: 139 mEq/L (ref 135–145)

## 2012-05-10 LAB — SURGICAL PCR SCREEN
MRSA, PCR: NEGATIVE
Staphylococcus aureus: NEGATIVE

## 2012-05-10 NOTE — Progress Notes (Signed)
Baseline stress test- exercise, 8-10 yrs. Ago, states last EKG was in [place] several yrs./ ago

## 2012-05-10 NOTE — Pre-Procedure Instructions (Signed)
20 JAILEY BOOTON  05/10/2012   Your procedure is scheduled on:  Tuesday May 15, 2012  Report to [facility] at 5:30 AM.  Call this number if you have problems the morning of surgery: [phone]   Remember:   Do not eat food or drink After Midnight.      Take these medicines the morning of surgery with A SIP OF WATER: carisoprodol, cymbalta, nexium    Do not wear jewelry, make-up or nail polish.  Do not wear lotions, powders, or perfumes.  Do not shave 48 hours prior to surgery. Men may shave face and neck.  Do not bring valuables to the hospital.  Contacts, dentures or bridgework may not be worn into surgery.  Leave suitcase in the car. After surgery it may be brought to your room.  For patients admitted to the hospital, checkout time is 11:00 AM the day of discharge.   Patients discharged the day of surgery will not be allowed to drive home.  Name and phone number of your driver: family / friend  Special Instructions: Shower using CHG 2 nights before surgery and the night before surgery.  If you shower the day of surgery use CHG.  Use special wash - you have one bottle of CHG for all showers.  You should use approximately 1/3 of the bottle for each shower.   Please read over the following fact sheets that you were given: Pain Booklet, Coughing and Deep Breathing, MRSA Information and Surgical Site Infection Prevention

## 2012-05-14 MED ORDER — VANCOMYCIN HCL IN DEXTROSE 1-5 GM/200ML-% IV SOLN
1000.0000 mg | INTRAVENOUS | Status: DC
  Filled 2012-05-14: qty 200

## 2012-05-15 ENCOUNTER — Encounter (HOSPITAL_COMMUNITY): Payer: Self-pay | Admitting: General Practice

## 2012-05-15 ENCOUNTER — Ambulatory Visit (HOSPITAL_COMMUNITY): Payer: Medicare Other | Admitting: Certified Registered Nurse Anesthetist

## 2012-05-15 ENCOUNTER — Encounter (HOSPITAL_COMMUNITY)
Admission: RE | Disposition: A | Discharge status: Home / Self Care | Payer: Self-pay | Source: Ambulatory Visit | Attending: General Surgery

## 2012-05-15 ENCOUNTER — Encounter (HOSPITAL_COMMUNITY): Payer: Self-pay | Admitting: Certified Registered Nurse Anesthetist

## 2012-05-15 ENCOUNTER — Ambulatory Visit (HOSPITAL_COMMUNITY)
Admission: RE | Admit: 2012-05-15 | Discharge: 2012-05-17 | Disposition: A | Discharge status: Home / Self Care | Payer: Medicare Other | Source: Ambulatory Visit | Attending: General Surgery | Admitting: General Surgery

## 2012-05-15 DIAGNOSIS — K432 Incisional hernia without obstruction or gangrene: Secondary | ICD-10-CM

## 2012-05-15 DIAGNOSIS — K219 Gastro-esophageal reflux disease without esophagitis: Secondary | ICD-10-CM | POA: Insufficient documentation

## 2012-05-15 DIAGNOSIS — K439 Ventral hernia without obstruction or gangrene: Secondary | ICD-10-CM | POA: Diagnosis present

## 2012-05-15 DIAGNOSIS — I10 Essential (primary) hypertension: Secondary | ICD-10-CM | POA: Diagnosis not present

## 2012-05-15 DIAGNOSIS — M549 Dorsalgia, unspecified: Secondary | ICD-10-CM | POA: Diagnosis not present

## 2012-05-15 DIAGNOSIS — M1990 Unspecified osteoarthritis, unspecified site: Secondary | ICD-10-CM | POA: Insufficient documentation

## 2012-05-15 DIAGNOSIS — Z01810 Encounter for preprocedural cardiovascular examination: Secondary | ICD-10-CM | POA: Insufficient documentation

## 2012-05-15 DIAGNOSIS — F329 Major depressive disorder, single episode, unspecified: Secondary | ICD-10-CM | POA: Diagnosis present

## 2012-05-15 DIAGNOSIS — Z23 Encounter for immunization: Secondary | ICD-10-CM | POA: Insufficient documentation

## 2012-05-15 DIAGNOSIS — F3289 Other specified depressive episodes: Secondary | ICD-10-CM | POA: Insufficient documentation

## 2012-05-15 HISTORY — DX: Anemia, unspecified: D64.9

## 2012-05-15 HISTORY — PX: HERNIA REPAIR: SHX51

## 2012-05-15 HISTORY — DX: Asymptomatic varicose veins of unspecified lower extremity: I83.90

## 2012-05-15 HISTORY — DX: Pneumonia, unspecified organism: J18.9

## 2012-05-15 HISTORY — PX: VENTRAL HERNIA REPAIR: SHX424

## 2012-05-15 SURGERY — REPAIR, HERNIA, VENTRAL, LAPAROSCOPIC
Anesthesia: General | Wound class: Clean

## 2012-05-15 MED ORDER — CHLORHEXIDINE GLUCONATE 4 % EX LIQD: 1.0000 "application " | Freq: Once | CUTANEOUS | Status: DC

## 2012-05-15 MED ORDER — LIDOCAINE HCL (CARDIAC) 20 MG/ML IV SOLN
INTRAVENOUS | Status: DC | PRN
  Administered 2012-05-15: 60 mg via INTRAVENOUS

## 2012-05-15 MED ORDER — ONDANSETRON HCL 4 MG PO TABS: 4.0000 mg | ORAL_TABLET | Freq: Four times a day (QID) | ORAL | Status: DC | PRN

## 2012-05-15 MED ORDER — ACETAMINOPHEN 10 MG/ML IV SOLN
1000.0000 mg | Freq: Four times a day (QID) | INTRAVENOUS | Status: AC
  Administered 2012-05-15 – 2012-05-16 (×4): 1000 mg via INTRAVENOUS
  Filled 2012-05-15 (×4): qty 100

## 2012-05-15 MED ORDER — ONDANSETRON HCL 4 MG/2ML IJ SOLN
INTRAMUSCULAR | Status: DC | PRN
  Administered 2012-05-15: 4 mg via INTRAVENOUS

## 2012-05-15 MED ORDER — HYDROCHLOROTHIAZIDE 12.5 MG PO CAPS
12.5000 mg | ORAL_CAPSULE | Freq: Every day | ORAL | Status: DC
  Administered 2012-05-16: 12.5 mg via ORAL
  Filled 2012-05-15 (×3): qty 1

## 2012-05-15 MED ORDER — KCL IN DEXTROSE-NACL 20-5-0.45 MEQ/L-%-% IV SOLN
INTRAVENOUS | Status: DC
  Administered 2012-05-15 – 2012-05-16 (×4): via INTRAVENOUS
  Filled 2012-05-15 (×9): qty 1000

## 2012-05-15 MED ORDER — DULOXETINE HCL 60 MG PO CPEP
60.0000 mg | ORAL_CAPSULE | Freq: Every day | ORAL | Status: DC
Start: 2012-05-15 — End: 2012-05-17
  Administered 2012-05-15: 60 mg via ORAL
  Filled 2012-05-15 (×3): qty 1

## 2012-05-15 MED ORDER — OXYCODONE HCL 5 MG PO TABS
5.0000 mg | ORAL_TABLET | ORAL | Status: DC | PRN
  Administered 2012-05-16: 5 mg via ORAL
  Filled 2012-05-15: qty 1

## 2012-05-15 MED ORDER — GLYCOPYRROLATE 0.2 MG/ML IJ SOLN
INTRAMUSCULAR | Status: DC | PRN
  Administered 2012-05-15: .5 mg via INTRAVENOUS

## 2012-05-15 MED ORDER — 0.9 % SODIUM CHLORIDE (POUR BTL) OPTIME
TOPICAL | Status: DC | PRN
  Administered 2012-05-15: 1000 mL

## 2012-05-15 MED ORDER — MIDAZOLAM HCL 5 MG/5ML IJ SOLN
INTRAMUSCULAR | Status: DC | PRN
  Administered 2012-05-15: 2 mg via INTRAVENOUS

## 2012-05-15 MED ORDER — ACETAMINOPHEN 10 MG/ML IV SOLN
INTRAVENOUS | Status: AC
  Filled 2012-05-15: qty 100

## 2012-05-15 MED ORDER — SIMVASTATIN 20 MG PO TABS
20.0000 mg | ORAL_TABLET | Freq: Every day | ORAL | Status: DC
  Administered 2012-05-15 – 2012-05-16 (×2): 20 mg via ORAL
  Filled 2012-05-15 (×3): qty 1

## 2012-05-15 MED ORDER — BUPIVACAINE-EPINEPHRINE PF 0.25-1:200000 % IJ SOLN
INTRAMUSCULAR | Status: AC
  Filled 2012-05-15: qty 30

## 2012-05-15 MED ORDER — INFLUENZA VIRUS VACC SPLIT PF IM SUSP
0.5000 mL | INTRAMUSCULAR | Status: AC
  Filled 2012-05-15: qty 0.5

## 2012-05-15 MED ORDER — ENOXAPARIN SODIUM 40 MG/0.4ML ~~LOC~~ SOLN
40.0000 mg | SUBCUTANEOUS | Status: DC
  Filled 2012-05-15 (×3): qty 0.4

## 2012-05-15 MED ORDER — MORPHINE SULFATE 2 MG/ML IJ SOLN
1.0000 mg | INTRAMUSCULAR | Status: DC | PRN
  Administered 2012-05-15: 1 mg via INTRAVENOUS
  Filled 2012-05-15: qty 1

## 2012-05-15 MED ORDER — DOCUSATE SODIUM 100 MG PO CAPS
100.0000 mg | ORAL_CAPSULE | Freq: Two times a day (BID) | ORAL | Status: DC
  Administered 2012-05-15 – 2012-05-16 (×3): 100 mg via ORAL
  Filled 2012-05-15 (×3): qty 1

## 2012-05-15 MED ORDER — NEOSTIGMINE METHYLSULFATE 1 MG/ML IJ SOLN
INTRAMUSCULAR | Status: DC | PRN
  Administered 2012-05-15: 3 mg via INTRAVENOUS

## 2012-05-15 MED ORDER — PROPOFOL 10 MG/ML IV BOLUS
INTRAVENOUS | Status: DC | PRN
  Administered 2012-05-15: 150 mg via INTRAVENOUS

## 2012-05-15 MED ORDER — ONDANSETRON HCL 4 MG/2ML IJ SOLN: 4.0000 mg | Freq: Four times a day (QID) | INTRAMUSCULAR | Status: DC | PRN

## 2012-05-15 MED ORDER — LACTATED RINGERS IV SOLN
INTRAVENOUS | Status: DC | PRN
  Administered 2012-05-15 (×2): via INTRAVENOUS

## 2012-05-15 MED ORDER — PANTOPRAZOLE SODIUM 40 MG IV SOLR
40.0000 mg | Freq: Every day | INTRAVENOUS | Status: DC
  Administered 2012-05-15 – 2012-05-16 (×2): 40 mg via INTRAVENOUS
  Filled 2012-05-15 (×3): qty 40

## 2012-05-15 MED ORDER — BIOTENE DRY MOUTH MT LIQD
15.0000 mL | Freq: Two times a day (BID) | OROMUCOSAL | Status: DC
  Administered 2012-05-15 – 2012-05-16 (×4): 15 mL via OROMUCOSAL

## 2012-05-15 MED ORDER — FENTANYL CITRATE 0.05 MG/ML IJ SOLN
INTRAMUSCULAR | Status: DC | PRN
  Administered 2012-05-15: 100 ug via INTRAVENOUS

## 2012-05-15 MED ORDER — BUPIVACAINE-EPINEPHRINE 0.25% -1:200000 IJ SOLN
INTRAMUSCULAR | Status: DC | PRN
  Administered 2012-05-15: 30 mL

## 2012-05-15 MED ORDER — KETOROLAC TROMETHAMINE 15 MG/ML IJ SOLN
15.0000 mg | Freq: Four times a day (QID) | INTRAMUSCULAR | Status: DC
  Administered 2012-05-15 – 2012-05-17 (×7): 15 mg via INTRAVENOUS
  Filled 2012-05-15 (×10): qty 1

## 2012-05-15 MED ORDER — ACETAMINOPHEN 10 MG/ML IV SOLN
INTRAVENOUS | Status: DC | PRN
  Administered 2012-05-15: 1000 mg via INTRAVENOUS

## 2012-05-15 MED ORDER — CARISOPRODOL 350 MG PO TABS
350.0000 mg | ORAL_TABLET | Freq: Two times a day (BID) | ORAL | Status: DC
Start: 2012-05-15 — End: 2012-05-17
  Administered 2012-05-15 – 2012-05-16 (×3): 350 mg via ORAL
  Filled 2012-05-15 (×3): qty 1

## 2012-05-15 MED ORDER — CIPROFLOXACIN IN D5W 400 MG/200ML IV SOLN
400.0000 mg | INTRAVENOUS | Status: AC
  Administered 2012-05-15: 400 mg via INTRAVENOUS
  Filled 2012-05-15: qty 200

## 2012-05-15 MED ORDER — PHENYLEPHRINE HCL 10 MG/ML IJ SOLN
INTRAMUSCULAR | Status: DC | PRN
  Administered 2012-05-15 (×2): 120 ug via INTRAVENOUS

## 2012-05-15 MED ORDER — HYDROMORPHONE HCL PF 1 MG/ML IJ SOLN
0.2500 mg | INTRAMUSCULAR | Status: DC | PRN
  Administered 2012-05-15 (×2): 0.5 mg via INTRAVENOUS

## 2012-05-15 MED ORDER — ROCURONIUM BROMIDE 100 MG/10ML IV SOLN
INTRAVENOUS | Status: DC | PRN
  Administered 2012-05-15: 50 mg via INTRAVENOUS
  Administered 2012-05-15: 10 mg via INTRAVENOUS

## 2012-05-15 MED ORDER — HYDROMORPHONE HCL PF 1 MG/ML IJ SOLN
INTRAMUSCULAR | Status: AC
  Filled 2012-05-15: qty 1

## 2012-05-15 SURGICAL SUPPLY — 50 items
BINDER ABD UNIV 12 30-45 (MISCELLANEOUS) IMPLANT
BINDER ABDOMINAL 12 (MISCELLANEOUS)
BLADE SURG ROTATE 9660 (MISCELLANEOUS) IMPLANT
CANISTER SUCTION 2500CC (MISCELLANEOUS) IMPLANT
CHLORAPREP W/TINT 26ML (MISCELLANEOUS) ×2 IMPLANT
CLOTH BEACON ORANGE TIMEOUT ST (SAFETY) ×2 IMPLANT
COVER SURGICAL LIGHT HANDLE (MISCELLANEOUS) ×2 IMPLANT
DECANTER SPIKE VIAL GLASS SM (MISCELLANEOUS) ×2 IMPLANT
DERMABOND ADVANCED (GAUZE/BANDAGES/DRESSINGS) ×1
DERMABOND ADVANCED .7 DNX12 (GAUZE/BANDAGES/DRESSINGS) ×1 IMPLANT
DEVICE SECURE STRAP 25 ABSORB (INSTRUMENTS) ×4 IMPLANT
DEVICE TROCAR PUNCTURE CLOSURE (ENDOMECHANICALS) ×2 IMPLANT
DRAPE UTILITY 15X26 W/TAPE STR (DRAPE) ×4 IMPLANT
DRAPE WARM FLUID 44X44 (DRAPE) ×2 IMPLANT
ELECT REM PT RETURN 9FT ADLT (ELECTROSURGICAL) ×2
ELECTRODE REM PT RTRN 9FT ADLT (ELECTROSURGICAL) ×1 IMPLANT
GLOVE BIO SURGEON STRL SZ7.5 (GLOVE) ×2 IMPLANT
GLOVE BIOGEL M STRL SZ7.5 (GLOVE) ×2 IMPLANT
GLOVE BIOGEL PI IND STRL 7.0 (GLOVE) ×2 IMPLANT
GLOVE BIOGEL PI IND STRL 7.5 (GLOVE) ×1 IMPLANT
GLOVE BIOGEL PI IND STRL 8 (GLOVE) ×1 IMPLANT
GLOVE BIOGEL PI INDICATOR 7.0 (GLOVE) ×2
GLOVE BIOGEL PI INDICATOR 7.5 (GLOVE) ×1
GLOVE BIOGEL PI INDICATOR 8 (GLOVE) ×1
GLOVE ECLIPSE 6.5 STRL STRAW (GLOVE) ×2 IMPLANT
GOWN PREVENTION PLUS XXLARGE (GOWN DISPOSABLE) ×2 IMPLANT
GOWN STRL NON-REIN LRG LVL3 (GOWN DISPOSABLE) ×4 IMPLANT
KIT BASIN OR (CUSTOM PROCEDURE TRAY) ×2 IMPLANT
KIT ROOM TURNOVER OR (KITS) ×2 IMPLANT
MESH VENTRALIGHT ST 6IN CRC (Mesh General) ×2 IMPLANT
NEEDLE SPNL 22GX3.5 QUINCKE BK (NEEDLE) ×2 IMPLANT
NS IRRIG 1000ML POUR BTL (IV SOLUTION) ×4 IMPLANT
PAD ARMBOARD 7.5X6 YLW CONV (MISCELLANEOUS) ×4 IMPLANT
PEN SKIN MARKING BROAD (MISCELLANEOUS) ×2 IMPLANT
SCISSORS LAP 5X35 DISP (ENDOMECHANICALS) IMPLANT
SET IRRIG TUBING LAPAROSCOPIC (IRRIGATION / IRRIGATOR) IMPLANT
SLEEVE ENDOPATH XCEL 5M (ENDOMECHANICALS) ×2 IMPLANT
SUT GORETEX 48 CV 2 THX 26 (SUTURE) IMPLANT
SUT MNCRL AB 4-0 PS2 18 (SUTURE) ×2 IMPLANT
SUT NOVA NAB DX-16 0-1 5-0 T12 (SUTURE) ×4 IMPLANT
SUT PROLENE 1 CT (SUTURE) IMPLANT
SUT VIC AB 3-0 SH 27 (SUTURE)
SUT VIC AB 3-0 SH 27XBRD (SUTURE) IMPLANT
TOWEL OR 17X24 6PK STRL BLUE (TOWEL DISPOSABLE) ×4 IMPLANT
TOWEL OR 17X26 10 PK STRL BLUE (TOWEL DISPOSABLE) ×2 IMPLANT
TRAY FOLEY CATH 14FR (SET/KITS/TRAYS/PACK) ×2 IMPLANT
TRAY LAPAROSCOPIC (CUSTOM PROCEDURE TRAY) ×2 IMPLANT
TROCAR XCEL BLUNT TIP 100MML (ENDOMECHANICALS) IMPLANT
TROCAR XCEL NON-BLD 11X100MML (ENDOMECHANICALS) ×2 IMPLANT
TROCAR XCEL NON-BLD 5MMX100MML (ENDOMECHANICALS) ×2 IMPLANT

## 2012-05-15 NOTE — Anesthesia Postprocedure Evaluation (Signed)
  Anesthesia Post-op Note  Patient: Crystal Herrera  Procedure(s) Performed: Procedure(s) (LRB) with comments: LAPAROSCOPIC VENTRAL HERNIA (N/A) - laparoscopic ventral incisional hernia repair with mesh INSERTION OF MESH (N/A)  Patient Location: PACU  Anesthesia Type: General  Level of Consciousness: awake  Airway and Oxygen Therapy: Patient Spontanous Breathing  Post-op Pain: mild  Post-op Assessment: Post-op Vital signs reviewed  Post-op Vital Signs: Reviewed  Complications: No apparent anesthesia complications

## 2012-05-15 NOTE — Preoperative (Signed)
Beta Blockers   Reason not to administer Beta Blockers:Not Applicable 

## 2012-05-15 NOTE — Interval H&P Note (Signed)
History and Physical Interval Note:  05/15/2012 7:19 AM  Crystal Herrera  has presented today for surgery, with the diagnosis of ventral incisional hernia  The various methods of treatment have been discussed with the patient and family. After consideration of risks, benefits and other options for treatment, the patient has consented to  Procedure(s) (LRB) with comments: LAPAROSCOPIC VENTRAL HERNIA (N/A) - laparoscopic ventral incisional hernia repair with mesh INSERTION OF MESH (N/A) as a surgical intervention .  The patient's history has been reviewed, patient examined, no change in status, stable for surgery.  I have reviewed the patient's chart and labs.  Questions were answered to the patient's satisfaction.    [name]. [name], MD, FACS General, Bariatric, & Minimally Invasive Surgery [facility], [place]   [facility] M

## 2012-05-15 NOTE — Transfer of Care (Signed)
Immediate Anesthesia Transfer of Care Note  Patient: Crystal Herrera  Procedure(s) Performed: Procedure(s) (LRB) with comments: LAPAROSCOPIC VENTRAL HERNIA (N/A) - laparoscopic ventral incisional hernia repair with mesh INSERTION OF MESH (N/A)  Patient Location: PACU  Anesthesia Type: General  Level of Consciousness: awake, alert , oriented and patient cooperative  Airway & Oxygen Therapy: Patient Spontanous Breathing and Patient connected to nasal cannula oxygen  Post-op Assessment: Report given to PACU RN, Post -op Vital signs reviewed and stable and Patient moving all extremities X 4  Post vital signs: Reviewed and stable  Complications: No apparent anesthesia complications

## 2012-05-15 NOTE — Anesthesia Procedure Notes (Signed)
Procedure Name: Intubation Date/Time: 05/15/2012 7:40 AM Performed by: Rogelia Boga Pre-anesthesia Checklist: Patient identified, Emergency Drugs available, Suction available, Patient being monitored and Timeout performed Patient Re-evaluated:Patient Re-evaluated prior to inductionOxygen Delivery Method: Circle system utilized Preoxygenation: Pre-oxygenation with 100% oxygen Intubation Type: IV induction Ventilation: Mask ventilation without difficulty and Oral airway inserted - appropriate to patient size Laryngoscope Size: Mac and 4 Grade View: Grade II Tube type: Oral Tube size: 7.5 mm Number of attempts: 1 Airway Equipment and Method: Stylet Placement Confirmation: ETT inserted through vocal cords under direct vision,  positive ETCO2 and breath sounds checked- equal and bilateral Secured at: 21 cm Tube secured with: Tape Dental Injury: Teeth and Oropharynx as per pre-operative assessment

## 2012-05-15 NOTE — Op Note (Addendum)
Laparoscopic Ventral Incisional Hernia Repair Procedure Note  Indications: Symptomatic supraumbilical ventral incisional hernia  Pre-operative Diagnosis: supraumbilical ventral incisional hernia  Post-operative Diagnosis: same  Surgeon: Atilano Ina   Assistants: none  Anesthesia: General endotracheal anesthesia  ASA Class: 3  Procedure Details  The patient was seen in the Holding Room. The risks, benefits, complications, treatment options, and expected outcomes were discussed with the patient. The possibilities of reaction to medication, pulmonary aspiration, perforation of viscus, bleeding, recurrent infection, the need for additional procedures, failure to diagnose a condition, and creating a complication requiring transfusion or operation were discussed with the patient. The patient concurred with the proposed plan, giving informed consent.  The site of surgery properly noted/marked. The patient was taken to the operating room, identified as Crystal Herrera and the procedure verified as laparoscopic ventral hernia repair with mesh. A Time Out was held and the above information confirmed.    The patient was placed supine.  After establishing general anesthesia, a Foley catheter was placed.  The abdomen was prepped with Chloraprep and draped in standard fashion.  A 5 mm Optiview was used the cannulate the peritoneal cavity in the left upper quadrant below the costal margin.  Pneumoperitoneum was obtained by insufflating CO2, maintaining a maximum pressure of 15 mmHg.  The 5 mm 30-degree laparoscopic was inserted.  There were no significant omental adhesions to the anterior abdominal wall in and around the hernia defect.  Because the supraumbilical hernia defect was slightly to the left, I elected to work on the right side of the abdomen. A 5-mm trocar was placed in the right upper quadrant under direct visualization after local had been infiltrated. An 11-mm port was placed in the right  anterior axillary line at the level of the umbilicus.    Endoshears with cautery and gentle traction were used to dissect the falciform ligament away from the anterior abdominal wall.  We cleared the entire abdominal wall and were able to visualize 1 fascial defects. We used a spinal needle to identify the extent of the hernia defects.  This covered an area of about 4 cms x 3.5 cms.  We selected a 15.2cm piece of Bard VentralightST mesh.  We placed 8 stay sutures of 0 Novofil around the edges of the mesh.  The mesh was then rolled up and inserted through the 11 mm port site.  The mesh was then unrolled.  The stay sutures were then pulled up through small stab incisions using the Endo-close device.  This deployed the mesh widely over the fascial defects.  The stay sutures were then tied down.  The Secure Strap device was then used to tack down the edges of the mesh at 1 cm intervals circumferentially.  We placed a few tacks inside the outer ring of tacks.  We inspected for hemostasis.   Pneumoperitoneum was then released as we removed the remainder of the trocars.  The port sites were closed with 4-0 Monocryl.  All of the incisions and stay suture sites were then sealed with Dermabond.  An abdominal binder was placed around the patient's abdomen.  The patient was extubated and brought to the recovery room in stable condition.  All sponge, instrument, and needle counts were correct prior to closure and at the conclusion of the case.   Findings: 4 x 3.5 cm supraumbilical hernia; with adding a 5 cm overlap circumferentially, a 15.2 cm round Bard VentralightST mesh was used. Placed inside abdominal cavity against peritoneum  Estimated Blood Loss:  Minimal         Complications:  None; patient tolerated the procedure well.         Disposition: PACU - hemodynamically stable.         Condition: stable  Crystal Herrera. [name], MD, FACS General, Bariatric, & Minimally Invasive Surgery [facility], [place]

## 2012-05-15 NOTE — Anesthesia Preprocedure Evaluation (Addendum)
Anesthesia Evaluation  Patient identified by MRN, date of birth, ID band Patient awake    Reviewed: Allergy & Precautions, H&P , NPO status   History of Anesthesia Complications (+) PONV  Airway Mallampati: II TM Distance: >3 FB Neck ROM: Full    Dental  (+) Dental Advisory Given   Pulmonary neg pulmonary ROS, former smoker,  breath sounds clear to auscultation        Cardiovascular hypertension, Pt. on medications Rhythm:Regular Rate:Normal     Neuro/Psych  Headaches, PSYCHIATRIC DISORDERS Depression    GI/Hepatic Neg liver ROS, GERD-  Controlled,  Endo/Other  negative endocrine ROS  Renal/GU negative Renal ROS     Musculoskeletal  (+) Arthritis -, Osteoarthritis,    Abdominal   Peds  Hematology negative hematology ROS (+)   Anesthesia Other Findings   Reproductive/Obstetrics                         Anesthesia Physical Anesthesia Plan  ASA: III  Anesthesia Plan: General   Post-op Pain Management:    Induction: Intravenous  Airway Management Planned: Oral ETT  Additional Equipment:   Intra-op Plan:   Post-operative Plan: Extubation in OR  Informed Consent: I have reviewed the patients History and Physical, chart, labs and discussed the procedure including the risks, benefits and alternatives for the proposed anesthesia with the patient or authorized representative who has indicated his/her understanding and acceptance.   Dental advisory given  Plan Discussed with: CRNA, Anesthesiologist and Surgeon  Anesthesia Plan Comments:         Anesthesia Quick Evaluation

## 2012-05-15 NOTE — Progress Notes (Signed)
Received patient from Pacu post hernia repair with mesh. Alert and oriented, not in any distress, VSS. Will monitor.

## 2012-05-15 NOTE — H&P (View-Only) (Signed)
Patient ID: ABERDEEN HAFEN, female   DOB: February 25, 1941, 71 y.o.   MRN: [id number]  Chief Complaint  Patient presents with  . Pre-op Exam    eval VH    HPI Crystal Herrera is a 71 y.o. female.   HPI 71 year old Caucasian female was referred by Dr. Christella Hartigan for evaluation of a ventral hernia. She states her only prior abdominal surgeries have been a laparoscopic cholecystectomy as well as a laparoscopic removal of an ovarian cyst. She states that she developed abdominal pain a few years ago. She describes it as a sensation of being uncomfortable in her upper midline area. She states that occasionally there'll be burning. She states that for a long time the discomfort was pretty much constant however she hasn't had much discomfort since she saw Dr. Christella Hartigan a few weeks ago. When she initially saw him about a year ago he placed her on Nexium. However she states that she cannot tell any difference in her discomfort after taking the Nexium and being compliant with it. She denies any vomiting. She denies any diarrhea or constipation. She denies any melena or hematochezia. She states that she has eliminated all fried and greasy foods from her diet. She states that generally spicy foods or tomato based foods will cause a worsening discomfort Past Medical History  Diagnosis Date  . HYPERLIPIDEMIA 02/09/2010  . DEPRESSION 02/09/2010  . COMMON MIGRAINE 02/09/2010  . HYPERTENSION 02/09/2010  . GERD 02/09/2010  . SKIN LESION 03/22/2010  . DEGENERATIVE JOINT DISEASE 02/09/2010  . OCCIPITAL NEURALGIA 06/09/2010  . BACK PAIN, CHRONIC 02/09/2010  . OSTEOPENIA 02/09/2010  . FATIGUE 02/09/2010  . Diarrhea 04/26/2010  . COLONIC POLYPS, HX OF 02/09/2010  . Lumbar disc disease     Past Surgical History  Procedure Date  . Tubal ligation   . Chymopapain injection 1986    Lumbar  . Lumbar laminectomy 1986  . Ovarian cyst removal 2006  . Varicose vein removed 2008  . Cholecystectomy   . Breast surgery 1988    breast  biopsy-Neg    Family History  Problem Relation Age of Onset  . Lung cancer Mother   . Diabetes Father   . Breast cancer Maternal Grandmother   . Cancer Maternal Grandmother     breast  . Colon cancer Maternal Aunt     Social History History  Substance Use Topics  . Smoking status: Former Smoker    Types: Cigarettes  . Smokeless tobacco: Never Used  . Alcohol Use: No    Allergies  Allergen Reactions  . Penicillins   . Sulfonamide Derivatives     Current Outpatient Prescriptions  Medication Sig Dispense Refill  . acetaminophen (TYLENOL) 325 MG tablet Take 650 mg by mouth every 6 (six) hours as needed.        . calcium gluconate 500 MG tablet Take 500 mg by mouth 2 (two) times daily.        . DULoxetine (CYMBALTA) 60 MG capsule Take 1 capsule (60 mg total) by mouth daily.  90 capsule  2  . esomeprazole (NEXIUM) 40 MG capsule Take 1 capsule (40 mg total) by mouth daily before breakfast.  30 capsule  11  . glucosamine-chondroitin 500-400 MG tablet Take 1 tablet by mouth 2 (two) times daily.        . hydrochlorothiazide (MICROZIDE) 12.5 MG capsule Take 1 capsule (12.5 mg total) by mouth daily.  90 capsule  2  . simvastatin (ZOCOR) 20 MG tablet Take 1 tablet (20  mg total) by mouth at bedtime.  90 tablet  2  . carisoprodol (SOMA) 350 MG tablet Take 1 tablet (350 mg total) by mouth 2 (two) times daily.  180 tablet  1  . diclofenac sodium (VOLTAREN) 1 % GEL Apply 1 application topically 4 (four) times daily.  9 Tube  11  . DISCONTD: esomeprazole (NEXIUM) 40 MG capsule Take 1 capsule (40 mg total) by mouth daily.  90 capsule  3    Review of Systems Review of Systems  Constitutional: Negative for fever, chills, activity change, appetite change and unexpected weight change.  HENT: Negative for hearing loss, nosebleeds and neck pain.   Eyes: Negative for photophobia and visual disturbance.  Respiratory: Negative for chest tightness, shortness of breath and wheezing.     Cardiovascular: Negative for chest pain, palpitations and leg swelling.       Denies cp, sob, pnd, orthopnea  Gastrointestinal:       See hpi  Genitourinary: Negative for dysuria, hematuria and difficulty urinating.  Musculoskeletal: Negative for joint swelling and arthralgias.  Skin: Negative for pallor and rash.  Neurological: Negative for tremors, seizures, syncope, speech difficulty and headaches.       Denies TIA's, amaurosis fugax  Hematological: Negative for adenopathy. Does not bruise/bleed easily.  Psychiatric/Behavioral: Negative for suicidal ideas, behavioral problems and decreased concentration.    Blood pressure 152/78, pulse 56, temperature 98.8 F (37.1 C), temperature source Temporal, resp. rate 16, height 5\' 5"  (1.651 m), weight 150 lb (68.04 kg).  Physical Exam Physical Exam  Vitals reviewed. Constitutional: She is oriented to person, place, and time. She appears well-developed and well-nourished. No distress.  HENT:  Head: Normocephalic and atraumatic.  Right Ear: External ear normal.  Left Ear: External ear normal.  Eyes: Conjunctivae normal are normal. No scleral icterus.  Neck: Normal range of motion. Neck supple. No tracheal deviation present. No thyromegaly present.  Cardiovascular: Normal rate, regular rhythm and normal heart sounds.   Pulmonary/Chest: Effort normal and breath sounds normal. No respiratory distress. She has no wheezes.  Abdominal: Soft. Bowel sounds are normal. She exhibits no distension. There is no tenderness. There is no rebound. A hernia is present. Hernia confirmed positive in the ventral area.         Some scattered teleangiectasia's on upper abd wall; soft, reducible supraumbilical hernia about 2.5-3cm defect  Musculoskeletal: Normal range of motion. She exhibits no edema and no tenderness.  Neurological: She is alert and oriented to person, place, and time.  Skin: Skin is warm and dry. No rash noted. She is not diaphoretic. No  erythema.  Psychiatric: She has a normal mood and affect. Her behavior is normal. Judgment and thought content normal.    Data Reviewed Dr Larae Grooms note CT a/p 06/2011  CT ABDOMEN AND PELVIS WITH CONTRAST  Technique: Multidetector CT imaging of the abdomen and pelvis was  performed following the standard protocol during bolus  administration of intravenous contrast.  Contrast: 100 ml of omni 300  Comparison: None  Findings: The lung bases appear clear.  No pericardial or pleural effusion identified.  Prior cholecystectomy. No biliary dilatation. The pancreas  appears normal.  Calcified granulomas within the splenic parenchyma.  No suspicious liver abnormality.  Normal appearance of both kidneys.  No upper abdominal adenopathy.  There is no pelvic or inguinal adenopathy identified. Urinary  bladder appears normal. The uterus and adnexal structures are  negative.  There is a normal appearance of the stomach.  The small bowel loops  appear normal.  Appendix is identified and appears normal. Normal appearance of the  colon. There is a supraumbilical ventral abdominal wall hernia  which contains fat only.  Review of the visualized osseous structures is significant for mild  degenerative disc disease.   IMPRESSION:  1. No acute findings identified within the abdomen or pelvis.  2. Prior cholecystectomy.  3. Ventral abdominal wall hernia which contains fat only.   Assessment    Ventral incisional hernia    Plan    We discussed the etiology of ventral hernias. We discussed the signs and symptoms of incarceration and strangulation. The patient was given educational material. I also drew diagrams.  We discussed nonoperative and operative management. With respect to operative management, we discussed both open repair and laparoscopic repair. We discussed the pros and cons of each approach. I discussed the typical aftercare with each procedure and how each procedure differs.  The  patient is leaning toward laparoscopic ventral hernia repair with mesh  We discussed the risk and benefits of surgery including but not limited to bleeding, infection, injury to surrounding structures, hernia recurrence, mesh complications, hematoma/seroma formation, need to convert to an open procedure, blood clot formation, urinary retention, post operative ileus, general anesthesia risk, long-term abdominal pain. We discussed that this procedure can be quite uncomfortable and difficult to recover from based on how the mesh is secured to the abdominal wall. We discussed the importance of avoiding heavy lifting and straining for a period of 6 weeks.  I explained to the patient and her husband that I could not guarantee that repairing her hernia would make any difference with respect to her intolerance to certain foods like spicy or tomato based foods   The patient is leaning toward surgery however she wants to think about it for a few days. I instructed the patient to contact our office if she has any additional questions or if she would like to go ahead and schedule surgery.  [name]. [name], MD, FACS General, Bariatric, & Minimally Invasive Surgery [facility], [place]         [facility] M 04/27/2012, 12:44 PM

## 2012-05-16 ENCOUNTER — Encounter (HOSPITAL_COMMUNITY): Payer: Self-pay | Admitting: General Surgery

## 2012-05-16 LAB — BASIC METABOLIC PANEL
BUN: 6 mg/dL (ref 6–23)
CO2: 28 mEq/L (ref 19–32)
Calcium: 8.3 mg/dL — ABNORMAL LOW (ref 8.4–10.5)
Chloride: 97 mEq/L (ref 96–112)
Creatinine, Ser: 0.66 mg/dL (ref 0.50–1.10)
GFR calc Af Amer: >90 mL/min (ref >90)
GFR calc non Af Amer: 86 mL/min — ABNORMAL LOW (ref >90)
Glucose, Bld: 105 mg/dL — ABNORMAL HIGH (ref 70–99)
Potassium: 3.8 mEq/L (ref 3.5–5.1)
Sodium: 131 mEq/L — ABNORMAL LOW (ref 135–145)

## 2012-05-16 LAB — CBC
HCT: 35.1 % — ABNORMAL LOW (ref 36.0–46.0)
Hemoglobin: 11.8 g/dL — ABNORMAL LOW (ref 12.0–15.0)
MCH: 30.8 pg (ref 26.0–34.0)
MCHC: 33.6 g/dL (ref 30.0–36.0)
MCV: 91.6 fL (ref 78.0–100.0)
Platelets: 197 10*3/uL (ref 150–400)
RBC: 3.83 MIL/uL — ABNORMAL LOW (ref 3.87–5.11)
RDW: 13.4 % (ref 11.5–15.5)
WBC: 8.1 10*3/uL (ref 4.0–10.5)

## 2012-05-16 LAB — MAGNESIUM: Magnesium: 1.8 mg/dL (ref 1.5–2.5)

## 2012-05-16 MED ORDER — ACETAMINOPHEN 500 MG PO TABS
500.0000 mg | ORAL_TABLET | ORAL | Status: DC | PRN
  Administered 2012-05-16: 500 mg via ORAL
  Filled 2012-05-16 (×2): qty 1

## 2012-05-16 MED ORDER — ACETAMINOPHEN 10 MG/ML IV SOLN: 1000.0000 mg | Freq: Once | INTRAVENOUS | Status: DC

## 2012-05-16 NOTE — Progress Notes (Signed)
1 Day Post-Op  Subjective: Doing well. Tolerating fulls. No n/v. Some gas from below. Pain ok  Objective: Vital signs in last 24 hours: Temp:  [97.7 F (36.5 C)-98.9 F (37.2 C)] 97.7 F (36.5 C) (10/23 1303) Pulse Rate:  [70-83] 70  (10/23 1303) Resp:  [18] 18  (10/23 1303) BP: (120-142)/(57-76) 142/76 mmHg (10/23 1303) SpO2:  [95 %-100 %] 100 % (10/23 1303) Weight:  [150 lb (68.04 kg)] 150 lb (68.04 kg) (10/23 0602) Last BM Date: 05/14/12  Intake/Output from previous day: 10/22 0701 - 10/23 0700 In: 3082.2 [P.O.:240; I.V.:2642.2; IV Piggyback:200] Out: 250 [Urine:250] Intake/Output this shift: Total I/O In: 600 [P.O.:600] Out: 600 [Urine:600]  Alert, nad cta b/l Reg Soft, incision c/d/i - mild bruising. Expected mild TTP. nd +SCD  Lab Results:   Basename 05/16/12 0640  WBC 8.1  HGB 11.8*  HCT 35.1*  PLT 197   BMET  Basename 05/16/12 0640  NA 131*  K 3.8  CL 97  CO2 28  GLUCOSE 105*  BUN 6  CREATININE 0.66  CALCIUM 8.3*   PT/INR No results found for this basename: LABPROT:2,INR:2 in the last 72 hours ABG No results found for this basename: PHART:2,PCO2:2,PO2:2,HCO3:2 in the last 72 hours  Studies/Results: No results found.  Anti-infectives: Anti-infectives     Start     Dose/Rate Route Frequency Ordered Stop   05/15/12 0800   ciprofloxacin (CIPRO) IVPB 400 mg        400 mg 200 mL/hr over 60 Minutes Intravenous To Surgery 05/15/12 0749 05/15/12 0753   05/15/12 0600   vancomycin (VANCOCIN) IVPB 1000 mg/200 mL premix  Status:  Discontinued        1,000 mg 200 mL/hr over 60 Minutes Intravenous On call to O.R. 05/14/12 1540 05/15/12 1059          Assessment/Plan: s/p Procedure(s) (LRB) with comments: LAPAROSCOPIC VENTRAL HERNIA (N/A) - laparoscopic ventral incisional hernia repair with mesh INSERTION OF MESH (N/A)  Adv diet as tolerated Ambulate in halls vte prophylaxis Hopefully home in am  [name]. [name], MD, FACS General,  Bariatric, & Minimally Invasive Surgery [facility], [place]   LOS: 1 day    Atilano Ina 05/16/2012

## 2012-05-17 MED ORDER — OXYCODONE-ACETAMINOPHEN 5-325 MG PO TABS: 1.0000 | ORAL_TABLET | ORAL | Status: DC | PRN

## 2012-05-17 NOTE — Discharge Summary (Signed)
Physician Discharge Summary  Patient ID: ATHZIRY MILLICAN MRN: [id number] DOB/AGE: 1941-01-10 71 y.o.  Admit date: 05/15/2012 Discharge date: 05/17/2012  Admission Diagnoses: Principal Problem:  *Ventral hernia, unspecified, without mention of obstruction or gangrene Active Problems:  DEPRESSION  HYPERTENSION  GERD  Discharge Diagnoses:  Principal Problem:  *Ventral hernia, unspecified, without mention of obstruction or gangrene Active Problems:  DEPRESSION  HYPERTENSION  GERD   Discharged Condition: good  Hospital Course: 71 yo admitted for planned repair of supraumbilical incisional hernia. Her operative course was unremarkable. She was maintained on perioperative DVT prophylaxis (lovenox and scds). She was started on a clear liquid diet and gradually advanced to a regular diet. She was having flatus without any nausea or vomiting. Her postoperative pain was managed with IV tylenol, IV toradol, morphine and oxycodone. On POD 2, she was deemed stable for discharge. She was ambulating, her pain was controlled, her vital signs were stable. We had discussed discharge instructions.  Consults: None  Significant Diagnostic Studies: labs: cbc and bmet  Treatments: IV hydration, analgesia: acetaminophen, Morphine and oxycodone, toradol and surgery: LAPAROSCOPIC REPAIR OF SUPRAUMBILICAL INCISIONAL HERNIA WITH MESH 05/15/12  Discharge Exam: Blood pressure 109/55, pulse 66, temperature 97.9 F (36.6 C), temperature source Oral, resp. rate 16, height 5\' 5"  (1.651 m), weight 150 lb (68.04 kg), SpO2 96.00%. Alert, nad cta b/l Reg Soft, expected mild TTP, incision c/d/i, +bs. Some bruising around trocar and suture sites No edema  Disposition: discharge to home      Discharge Orders    Future Appointments: Provider: Department: Dept Phone: Center:   05/21/2012 10:00 AM Corwin Levins, MD [facility] [phone] [facility]   06/01/2012 8:45 AM Atilano Ina, MD,FACS [facility]  [phone] None     Future Orders Please Complete By Expires   Discharge patient      Comments:   D/c iv and d/c home       Medication List     As of 05/17/2012  9:21 AM    TAKE these medications         acetaminophen 325 MG tablet   Commonly known as: TYLENOL   Take 650 mg by mouth every 6 (six) hours as needed. For pain      calcium gluconate 500 MG tablet   Take 500 mg by mouth 2 (two) times daily.      carisoprodol 350 MG tablet   Commonly known as: SOMA   Take 1 tablet (350 mg total) by mouth 2 (two) times daily.      diclofenac sodium 1 % Gel   Commonly known as: VOLTAREN   Apply 1 application topically 4 (four) times daily.      DULoxetine 60 MG capsule   Commonly known as: CYMBALTA   Take 60 mg by mouth daily after supper.      esomeprazole 40 MG capsule   Commonly known as: NEXIUM   Take 1 capsule (40 mg total) by mouth daily before breakfast.      Evening Primrose Oil 1000 MG Caps   Take 1 capsule by mouth every morning. Take one every morning per nurse and patient.      glucosamine-chondroitin 500-400 MG tablet   Take 1 tablet by mouth 2 (two) times daily.      hydrochlorothiazide 12.5 MG capsule   Commonly known as: MICROZIDE   Take 12.5 mg by mouth daily after breakfast.      oxyCODONE-acetaminophen 5-325 MG per tablet   Commonly known as: PERCOCET/ROXICET  Take 1-2 tablets by mouth every 4 (four) hours as needed for pain.      simvastatin 20 MG tablet   Commonly known as: ZOCOR   Take 1 tablet (20 mg total) by mouth at bedtime.        Follow-up Information    Follow up with Atilano Ina, MD,FACS. On 06/01/2012. (8:45 AM)    Contact information:   [address] [place] [address] [phone]         Atilano Ina, MD [facility], [place]  Signed: Atilano Ina 05/17/2012, 9:21 AM

## 2012-05-17 NOTE — Progress Notes (Signed)
Patient discharged to home in care of spouse. Medications and instructions reviewed with patient and spouse with all questions answered. IV d/c'd with cath intact. Dressing CDI. Assessment unchanged from this am. Patient is to follow up with Dr. [name] on 11.8.13.

## 2012-05-21 ENCOUNTER — Ambulatory Visit: Payer: Medicare Other | Admitting: Internal Medicine

## 2012-05-22 ENCOUNTER — Telehealth (INDEPENDENT_AMBULATORY_CARE_PROVIDER_SITE_OTHER): Payer: Self-pay

## 2012-05-22 NOTE — Telephone Encounter (Signed)
I did not get the daughter's name but she was visiting her mom today and she had complaints of nausea.  It had only been for about 30 minutes.  She has not vomited and has no fever.  She has eaten today and took her pain medicine with food.  Her bowels have been moving but not yet today.  I advised to have a snack and drink plenty of liquids and see if it passes.  I did not know the cause of it and she didn't want anything called in.  She wants to make sure everything is ok.  I told her if her binder was uncomfortable she can take it off for about 30 minutes but she needs to wear it while up during the day.

## 2012-05-23 ENCOUNTER — Telehealth (INDEPENDENT_AMBULATORY_CARE_PROVIDER_SITE_OTHER): Payer: Self-pay | Admitting: General Surgery

## 2012-05-23 NOTE — Telephone Encounter (Signed)
Pt calling to ask if OK to take Tylenol with her pain medicine (percocet.)  Explained that part of the medicine is Tylenol---325mg .  OK to add more Tylenol when taking it as long as not exceeding the label recommendation to total mg/ day.  She understands.

## 2012-06-01 ENCOUNTER — Encounter (INDEPENDENT_AMBULATORY_CARE_PROVIDER_SITE_OTHER): Payer: Self-pay | Admitting: General Surgery

## 2012-06-01 ENCOUNTER — Ambulatory Visit (INDEPENDENT_AMBULATORY_CARE_PROVIDER_SITE_OTHER): Payer: Medicare Other | Admitting: General Surgery

## 2012-06-01 VITALS — BP 124/80 | HR 76 | Temp 98.2°F | Resp 16 | Ht 65.0 in | Wt 147.0 lb

## 2012-06-01 DIAGNOSIS — Z09 Encounter for follow-up examination after completed treatment for conditions other than malignant neoplasm: Secondary | ICD-10-CM

## 2012-06-01 NOTE — Progress Notes (Signed)
Subjective:     Patient ID: Crystal Herrera, female   DOB: 1941/01/14, 71 y.o.   MRN: [id number]  HPI 71 year old Caucasian female comes in for her first postoperative appointment after undergoing laparoscopic repair of his supraumbilical ventral hernia with mesh on October 22. She was discharged home on October 24.she states that she has been doing well since surgery. She reports a normal appetite. She denies any fevers, chills, nausea, vomiting, diarrhea or constipation. She states she stopped taking the pain medicine a few days after surgery because it made her have nightmares. She states she had minimal abdominal pain until last night when she had some abdominal pain in the right lateral abdominal wall. It is more noticeable toward the evening. She also noticed it while she was sitting in the car riding here. It does not radiate.  Review of Systems     Objective:   Physical Exam BP 124/80  Pulse 76  Temp 98.2 F (36.8 C) (Temporal)  Resp 16  Ht 5\' 5"  (1.651 m)  Wt 147 lb (66.679 kg)  BMI 24.46 kg/m2  Gen: alert, NAD, non-toxic appearing Pupils: equal, no scleral icterus Pulm: Lungs clear to auscultation, symmetric chest rise CV: regular rate and rhythm Abd: soft, nontender, nondistended. Well-healed trocar sites. No cellulitis. No incisional hernia Ext: no edema, no calf tenderness Skin: no rash, no jaundice     Assessment:     Status post laparoscopic ventral hernia repair with mesh    Plan:     Overall I believe she is doing well. Her incisions look good. I explained that the discomfort she is having on the right side is not atypical. I explained that it should get better with time. We discussed taking some Motrin for the discomfort. She also has Soma at home and I explained that she could take that for the discomfort as well. I told her she could do it cardiovascular activity such as walking. She needs to continue to refrain from heavy lifting for another 4 weeks. Followup  in 6-8 weeks  [name]. [name], MD, FACS General, Bariatric, & Minimally Invasive Surgery [facility], [place]

## 2012-06-01 NOTE — Patient Instructions (Signed)
Can do light cardiovascular activities Can take soma for the abdominal discomfort

## 2012-06-04 ENCOUNTER — Telehealth: Payer: Self-pay

## 2012-06-04 MED ORDER — SIMVASTATIN 20 MG PO TABS: 20.0000 mg | ORAL_TABLET | Freq: Every day | ORAL | Status: DC

## 2012-06-04 NOTE — Telephone Encounter (Signed)
I am not sure what this was based on EPIC which started mar 2012, and I think the change may have been prior  I checked centricity as well for prior record - the only somewhat similar med was nortryptilene 25 qhs  If this is not correct, could she as her pharmacist what med she refers to?

## 2012-06-04 NOTE — Telephone Encounter (Signed)
The patient is requesting to go back on what she was on previous to Cymbalta.  She is in the donut hole and cannot afford cymbalta.  Please send to [facility].

## 2012-06-05 MED ORDER — CITALOPRAM HYDROBROMIDE 10 MG PO TABS: 10.0000 mg | ORAL_TABLET | Freq: Every day | ORAL | Status: DC

## 2012-06-05 NOTE — Telephone Encounter (Signed)
Called the patient and confirmed it was nortryptilene 25 qhs, [facility]

## 2012-06-05 NOTE — Telephone Encounter (Signed)
Done erx 

## 2012-06-05 NOTE — Telephone Encounter (Signed)
The nortryptilene is just not strong enough at 25 mg  High strengths cause dizziness and dry mouth possibly  OK to try citalopram 10 qd if she is ok with this

## 2012-06-05 NOTE — Telephone Encounter (Signed)
Called patient and citalopram is ok with her.

## 2012-06-06 DIAGNOSIS — M542 Cervicalgia: Secondary | ICD-10-CM | POA: Diagnosis not present

## 2012-06-06 DIAGNOSIS — G542 Cervical root disorders, not elsewhere classified: Secondary | ICD-10-CM | POA: Diagnosis not present

## 2012-06-27 DIAGNOSIS — M542 Cervicalgia: Secondary | ICD-10-CM | POA: Diagnosis not present

## 2012-06-27 DIAGNOSIS — R51 Headache: Secondary | ICD-10-CM | POA: Diagnosis not present

## 2012-07-24 ENCOUNTER — Encounter: Payer: Self-pay | Admitting: Internal Medicine

## 2012-07-24 ENCOUNTER — Ambulatory Visit (INDEPENDENT_AMBULATORY_CARE_PROVIDER_SITE_OTHER): Payer: Medicare Other | Admitting: Internal Medicine

## 2012-07-24 VITALS — BP 128/82 | HR 84 | Temp 98.1°F | Ht 65.0 in | Wt 147.0 lb

## 2012-07-24 DIAGNOSIS — M79609 Pain in unspecified limb: Secondary | ICD-10-CM

## 2012-07-24 DIAGNOSIS — H60509 Unspecified acute noninfective otitis externa, unspecified ear: Secondary | ICD-10-CM | POA: Diagnosis not present

## 2012-07-24 DIAGNOSIS — M79605 Pain in left leg: Secondary | ICD-10-CM

## 2012-07-24 DIAGNOSIS — H60543 Acute eczematoid otitis externa, bilateral: Secondary | ICD-10-CM

## 2012-07-24 MED ORDER — NORTRIPTYLINE HCL 25 MG PO CAPS: 25.0000 mg | ORAL_CAPSULE | Freq: Every day | ORAL | Status: DC

## 2012-07-24 NOTE — Progress Notes (Signed)
Subjective:    Patient ID: Crystal Herrera, female    DOB: 07-01-1941, 71 y.o.   MRN: [id number]  HPI  Pt presents to the clinic today with c/o of withdrawal symptoms from stopping her cymbalta back in 05/2012. She ran out of the medication and could not afford the refill due to being in the doughnut hole. She called the office and talked with Dr. Jonny Ruiz who prescribed her Celexa. She has been taking the Celexa for the past 10 days but is experiencing stomach pain, diarrhea, nervousness, dizziness and ringing in her ears. Since she has stopped the Celexa 3 days ago, the stomach pain and diarrhea have gone away but the other symptoms still remain.  Review of Systems      Past Medical History  Diagnosis Date  . HYPERLIPIDEMIA 02/09/2010  . HYPERTENSION 02/09/2010  . GERD 02/09/2010  . SKIN LESION 03/22/2010  . OCCIPITAL NEURALGIA 06/09/2010  . BACK PAIN, CHRONIC 02/09/2010  . OSTEOPENIA 02/09/2010  . FATIGUE 02/09/2010  . Diarrhea 04/26/2010  . COLONIC POLYPS, HX OF 02/09/2010  . Lumbar disc disease   . Complication of anesthesia   . PONV (postoperative nausea and vomiting)   . DEPRESSION 02/09/2010    pt. takes Cymbalta for pain issues   . COMMON MIGRAINE 02/09/2010    neuritis in R side of neck & head  . Neuromuscular disorder     neuritis in head & neck  . DEGENERATIVE JOINT DISEASE 02/09/2010    hands , lumbar stenosis   . Varicose vein of leg     BLE  . Pneumonia     "as an infant" (05/15/2012)  . Anemia 1948?    Current Outpatient Prescriptions  Medication Sig Dispense Refill  . acetaminophen (TYLENOL) 325 MG tablet Take 650 mg by mouth every 6 (six) hours as needed. For pain      . calcium gluconate 500 MG tablet Take 500 mg by mouth 2 (two) times daily.        . carisoprodol (SOMA) 350 MG tablet Take 1 tablet (350 mg total) by mouth 2 (two) times daily.  180 tablet  1  . diclofenac sodium (VOLTAREN) 1 % GEL Apply 1 application topically 4 (four) times daily.  9 Tube  11  .  Evening Primrose Oil 1000 MG CAPS Take 1 capsule by mouth every morning. Take one every morning per nurse and patient.      Marland Kitchen glucosamine-chondroitin 500-400 MG tablet Take 1 tablet by mouth 2 (two) times daily.        . hydrochlorothiazide (MICROZIDE) 12.5 MG capsule Take 12.5 mg by mouth daily after breakfast.      . simvastatin (ZOCOR) 20 MG tablet Take 1 tablet (20 mg total) by mouth at bedtime.  90 tablet  2  . nortriptyline (PAMELOR) 25 MG capsule Take 1 capsule (25 mg total) by mouth at bedtime.  30 capsule  0    Allergies  Allergen Reactions  . Penicillins Hives  . Sulfonamide Derivatives Hives  . Vancomycin     rash    Family History  Problem Relation Age of Onset  . Lung cancer Mother   . Diabetes Father   . Breast cancer Maternal Grandmother   . Cancer Maternal Grandmother     breast  . Colon cancer Maternal Aunt     History   Social History  . Marital Status: Married    Spouse Name: N/A    Number of Children: N/A  . Years of  Education: N/A   Occupational History  . Not on file.   Social History Main Topics  . Smoking status: Former Smoker -- 1.0 packs/day for 10 years    Types: Cigarettes    Quit date: 05/11/1971  . Smokeless tobacco: Never Used  . Alcohol Use: 1.8 oz/week    3 Glasses of wine per week     Comment: 05/15/2012 "wine 2-4 glasses/wk"  . Drug Use: No  . Sexually Active: Not Currently   Other Topics Concern  . Not on file   Social History Narrative   Coffee daily      Constitutional: Denies fever, malaise, fatigue, headache or abrupt weight changes.  HEENT: Pt reports ringing in her ears. Denies eye pain, eye redness, ear pain, wax buildup, runny nose, nasal congestion, bloody nose, or sore throat. Respiratory: Denies difficulty breathing, shortness of breath, cough or sputum production.   Cardiovascular: Denies chest pain, chest tightness, palpitations or swelling in the hands or feet.  Musculoskeletal: Denies decrease in range of  motion, difficulty with gait, muscle pain or joint pain and swelling.  Skin: Denies redness, rashes, lesions or ulcercations.  Neurological: Pt reports dizziness. Denies difficulty with memory, difficulty with speech or problems with balance and coordination.  Psych: Pt reports nervousness. Denies depression, SI/HI.  No other specific complaints in a complete review of systems (except as listed in HPI above).  Objective:   Physical Exam   BP 128/82  Pulse 84  Temp 98.1 F (36.7 C) (Oral)  Ht 5\' 5"  (1.651 m)  Wt 147 lb (66.679 kg)  BMI 24.46 kg/m2  SpO2 97% Wt Readings from Last 3 Encounters:  07/24/12 147 lb (66.679 kg)  06/01/12 147 lb (66.679 kg)  05/16/12 150 lb (68.04 kg)    General: Appears her stated age, well developed, well nourished in NAD. HEENT: Head: normal shape and size; Eyes: sclera white, no icterus, conjunctiva pink, PERRLA and EOMs intact; Ears: Tm's gray and intact, normal light reflex; Nose: mucosa pink and moist, septum midline; Throat/Mouth: Teeth present, mucosa pink and moist, no exudate, lesions or ulcerations noted.  Cardiovascular: Normal rate and rhythm. S1,S2 noted.  No murmur, rubs or gallops noted. No JVD or BLE edema. No carotid bruits noted. Pulmonary/Chest: Normal effort and positive vesicular breath sounds. No respiratory distress. No wheezes, rales or ronchi noted.   Neurological: Alert and oriented. Cranial nerves II-XII intact. Coordination normal. +DTRs bilaterally. Psychiatric: Mood and affect normal. Behavior is normal. Judgment and thought content normal.         Assessment & Plan:   Medication Withdrawal, likely due to abruptly stopping Cymbalta, new onset with additional workup required:  Do not take any of the Celexa Will restart Nortryptaline with the understanding that this may worsen her dizziness Pt understands risk and benefits of restarting the Nortryptaline  RTC as needed or if symptoms persist

## 2012-07-24 NOTE — Patient Instructions (Addendum)
Benzodiazepine Withdrawal   Benzodiazepines are a group of drugs that are prescribed for both short-term and long-term treatment of a variety of medical conditions. For some of these conditions, such as seizures and sudden and severe muscle spasms, they are used only for a few hours or a few days. For other conditions, such as anxiety, sleep problems, or frequent muscle spasms or to help prevent seizures, they are used for an extended period, usually weeks or months.  Benzodiazepines work by changing the way your brain functions. Normally, chemicals in your brain called neurotransmitters send messages between your brain cells. The neurotransmitter that benzodiazepines affect is called gamma-aminobutyric acid (GABA). GABA sends out messages that have a calming effect on many of the functions of your brain. Benzodiazepines make these messages stronger and increase this calming effect.  Short-term use of benzodiazepines usually does not cause problems when you stop taking the drugs. However, if you take benzodiazepines for a long time, your body can adjust to the drug and require more of it to produce the same effect (drug tolerance). Eventually, you can develop physical dependence on benzodiazepines, which is when you experience negative effects if your dosage of benzodiazepines is reduced or stopped too quickly. These negative effects are called symptoms of withdrawal.  SYMPTOMS  Symptoms of withdrawal may begin anytime within the first 10 days after you stop taking the benzodiazepine. They can last from several weeks up to a few months but usually are the worst between the first 10 to 14 days.   The actual symptoms also vary, depending on the type of benzodiazepine you take. Possible symptoms include:   Anxiety.   Excitability.   Irritability.   Depression.   Mood swings.   Trouble sleeping.   Confusion.   Uncontrollable shaking (tremors).   Muscle weakness.   Seizures.  DIAGNOSIS  To diagnose  benzodiazepine withdrawal, your caregiver will examine you for certain signs, such as:   Rapid heartbeat.   Rapid breathing.   Tremors.   High blood pressure.   Fever.   Mood changes.  Your caregiver also may ask the following questions about your use of benzodiazepines:   What type of benzodiazepine did you take?   How much did you take each day?   How long did you take the drug?   When was the last time you took the drug?   Do you take any other drugs?   Have you had alcohol recently?   Have you had a seizure recently?   Have you lost consciousness recently?   Have you had trouble remembering recent events?   Have you had a recent increase in anxiety, irritability, or trouble sleeping?  A drug test also may be administered.  TREATMENT  The treatment for benzodiazepine withdrawal can vary, depending on the type and severity of your symptoms, what type of benzodiazepine you have been taking, and how long you have been taking the benzodiazepine. Sometimes it is necessary for you to be treated in a hospital, especially if you are at risk of seizures.   Often, treatment includes a prescription for a long-acting benzodiazepine, the dosage of which is reduced slowly over a long period. This period could be several weeks or months. Eventually, your dosage will be reduced to a point that you can stop taking the drug, without experiencing withdrawal symptoms. This is called tapered withdrawal. Occasionally, minor symptoms of withdrawal continue for a few days or weeks after you have completed a tapered withdrawal.  SEEK IMMEDIATE   MEDICAL CARE IF:   You have a seizure.   You develop a craving for drugs or alcohol.   You begin to experience symptoms of withdrawal during your tapered withdrawal.   You become very confused.   You lose consciousness.   You have trouble breathing.   You think about hurting yourself or someone else.  Document Released: 06/30/2011 Document Revised: 10/03/2011 Document  Reviewed: 06/30/2011  Exit[employer] Patient Information 2013 [employer].

## 2012-07-26 ENCOUNTER — Ambulatory Visit (INDEPENDENT_AMBULATORY_CARE_PROVIDER_SITE_OTHER): Payer: Medicare Other | Admitting: General Surgery

## 2012-07-26 ENCOUNTER — Encounter (INDEPENDENT_AMBULATORY_CARE_PROVIDER_SITE_OTHER): Payer: Self-pay | Admitting: General Surgery

## 2012-07-26 VITALS — BP 126/82 | HR 80 | Temp 97.8°F | Resp 16 | Ht 65.0 in | Wt 147.2 lb

## 2012-07-26 DIAGNOSIS — Z09 Encounter for follow-up examination after completed treatment for conditions other than malignant neoplasm: Secondary | ICD-10-CM

## 2012-07-26 NOTE — Progress Notes (Signed)
Subjective:     Patient ID: Crystal Herrera, female   DOB: 12/15/1940, 72 y.o.   MRN: [id number]  HPI 72 year old Caucasian female comes in for her second postoperative appointment after undergoing laparoscopic repair of a ventral incisional hernia with mesh on October 22. She was last seen in the office on November 8. The right-sided abdominal pain she was having has resolved since her last appointment. She denies any fever, chills, abdominal pain. She has had a change in her medication. Is no longer able to afford Cymbalta and she has been switched to a different medication and had some side effects such as diarrhea and dizziness. She also had some moodiness after switching medications. Other than that she has not had any abdominal problems.  Review of Systems     Objective:   Physical Exam BP 126/82  Pulse 80  Temp 97.8 F (36.6 C) (Temporal)  Resp 16  Ht 5\' 5"  (1.651 m)  Wt 147 lb 3.2 oz (66.769 kg)  BMI 24.50 kg/m2 Alert, no apparent distress Abdomen-soft, nontender, nondistended. Well-healed incisions. No sign of incisional hernia.    Assessment:     Status post laparoscopic repair of ventral incisional hernia with mesh October 22    Plan:     Overall I think she is doing quite well with respect to her hernia repair. I encouraged her to followup with her primary care physician regarding her ongoing medication issues. She was released to full activities. followup as needed  [name]. [name], MD, FACS General, Bariatric, & Minimally Invasive Surgery [facility], [place]

## 2012-07-26 NOTE — Patient Instructions (Signed)
Can resume full activities 

## 2012-07-30 ENCOUNTER — Other Ambulatory Visit: Payer: Self-pay

## 2012-07-30 MED ORDER — HYDROCHLOROTHIAZIDE 12.5 MG PO CAPS: 12.5000 mg | ORAL_CAPSULE | Freq: Every day | ORAL | Status: DC

## 2012-08-02 DIAGNOSIS — J309 Allergic rhinitis, unspecified: Secondary | ICD-10-CM | POA: Diagnosis not present

## 2012-08-07 ENCOUNTER — Telehealth: Payer: Self-pay | Admitting: *Deleted

## 2012-08-07 DIAGNOSIS — M79605 Pain in left leg: Secondary | ICD-10-CM

## 2012-08-07 MED ORDER — NORTRIPTYLINE HCL 25 MG PO CAPS: 25.0000 mg | ORAL_CAPSULE | Freq: Every day | ORAL | Status: DC

## 2012-08-07 NOTE — Telephone Encounter (Signed)
Pt states that she is doing OK with the Nortriptyline, she is not having any upset stomach or depression. Pt would like 90 day supply with 3 refills sent to [facility].

## 2012-08-07 NOTE — Telephone Encounter (Signed)
Ash, It's ok with me to refill this for 90 days with 3 refills Rene Kocher

## 2012-08-07 NOTE — Telephone Encounter (Signed)
Rx sent to [facility]

## 2012-08-30 DIAGNOSIS — M533 Sacrococcygeal disorders, not elsewhere classified: Secondary | ICD-10-CM | POA: Diagnosis not present

## 2012-09-04 DIAGNOSIS — M549 Dorsalgia, unspecified: Secondary | ICD-10-CM | POA: Diagnosis not present

## 2012-09-04 DIAGNOSIS — M533 Sacrococcygeal disorders, not elsewhere classified: Secondary | ICD-10-CM | POA: Diagnosis not present

## 2012-09-07 DIAGNOSIS — L723 Sebaceous cyst: Secondary | ICD-10-CM | POA: Diagnosis not present

## 2012-09-08 ENCOUNTER — Other Ambulatory Visit: Payer: Self-pay

## 2012-09-11 DIAGNOSIS — I10 Essential (primary) hypertension: Secondary | ICD-10-CM | POA: Diagnosis not present

## 2012-09-11 DIAGNOSIS — E876 Hypokalemia: Secondary | ICD-10-CM | POA: Diagnosis not present

## 2012-09-11 DIAGNOSIS — R42 Dizziness and giddiness: Secondary | ICD-10-CM | POA: Diagnosis not present

## 2012-09-11 DIAGNOSIS — R112 Nausea with vomiting, unspecified: Secondary | ICD-10-CM | POA: Diagnosis not present

## 2012-09-18 DIAGNOSIS — M479 Spondylosis, unspecified: Secondary | ICD-10-CM | POA: Diagnosis not present

## 2012-09-19 DIAGNOSIS — M479 Spondylosis, unspecified: Secondary | ICD-10-CM | POA: Diagnosis not present

## 2012-10-02 DIAGNOSIS — M533 Sacrococcygeal disorders, not elsewhere classified: Secondary | ICD-10-CM | POA: Diagnosis not present

## 2012-10-09 DIAGNOSIS — M25559 Pain in unspecified hip: Secondary | ICD-10-CM | POA: Diagnosis not present

## 2012-10-16 DIAGNOSIS — M25559 Pain in unspecified hip: Secondary | ICD-10-CM | POA: Diagnosis not present

## 2012-10-30 DIAGNOSIS — IMO0002 Reserved for concepts with insufficient information to code with codable children: Secondary | ICD-10-CM | POA: Diagnosis not present

## 2012-10-30 DIAGNOSIS — M533 Sacrococcygeal disorders, not elsewhere classified: Secondary | ICD-10-CM | POA: Diagnosis not present

## 2012-10-30 DIAGNOSIS — M5137 Other intervertebral disc degeneration, lumbosacral region: Secondary | ICD-10-CM | POA: Diagnosis not present

## 2012-11-12 ENCOUNTER — Other Ambulatory Visit: Payer: Self-pay | Admitting: Internal Medicine

## 2012-11-13 ENCOUNTER — Telehealth: Payer: Self-pay | Admitting: Internal Medicine

## 2012-11-13 NOTE — Telephone Encounter (Signed)
Last seen by me one yr ago;  Needs OV please

## 2012-11-13 NOTE — Telephone Encounter (Signed)
Patient informed and scheduled appointment

## 2012-11-13 NOTE — Telephone Encounter (Signed)
Pt req refill to be call into [facility] for Carisoprodol 350mg . Please advise.

## 2012-11-14 DIAGNOSIS — M5137 Other intervertebral disc degeneration, lumbosacral region: Secondary | ICD-10-CM | POA: Diagnosis not present

## 2012-11-20 ENCOUNTER — Encounter: Payer: Self-pay | Admitting: Internal Medicine

## 2012-11-20 ENCOUNTER — Ambulatory Visit (INDEPENDENT_AMBULATORY_CARE_PROVIDER_SITE_OTHER): Payer: Medicare Other | Admitting: Internal Medicine

## 2012-11-20 VITALS — BP 130/84 | HR 80 | Temp 98.0°F | Ht 65.0 in | Wt 158.5 lb

## 2012-11-20 DIAGNOSIS — F329 Major depressive disorder, single episode, unspecified: Secondary | ICD-10-CM | POA: Diagnosis not present

## 2012-11-20 DIAGNOSIS — M549 Dorsalgia, unspecified: Secondary | ICD-10-CM

## 2012-11-20 DIAGNOSIS — I10 Essential (primary) hypertension: Secondary | ICD-10-CM | POA: Diagnosis not present

## 2012-11-20 MED ORDER — CARISOPRODOL 350 MG PO TABS: 350.0000 mg | ORAL_TABLET | Freq: Two times a day (BID) | ORAL | Status: DC

## 2012-11-20 NOTE — Assessment & Plan Note (Signed)
stable overall by history and exam, recent data reviewed with pt, and pt to continue medical treatment as before,  to f/u any worsening symptoms or concerns BP Readings from Last 3 Encounters:  11/20/12 130/84  07/26/12 126/82  07/24/12 128/82

## 2012-11-20 NOTE — Assessment & Plan Note (Signed)
stable overall by history and exam, recent data reviewed with pt, and pt to continue medical treatment as before,  to f/u any worsening symptoms or concerns Lab Results  Component Value Date   WBC 8.1 05/16/2012   HGB 11.8* 05/16/2012   HCT 35.1* 05/16/2012   PLT 197 05/16/2012   GLUCOSE 105* 05/16/2012   CHOL 130 05/24/2011   TRIG 63.0 05/24/2011   HDL 59.00 05/24/2011   LDLDIRECT 122.2 02/09/2010   LDLCALC 58 05/24/2011   ALT 20 05/24/2011   AST 19 05/24/2011   NA 131* 05/16/2012   K 3.8 05/16/2012   CL 97 05/16/2012   CREATININE 0.66 05/16/2012   BUN 6 05/16/2012   CO2 28 05/16/2012   TSH 1.24 05/24/2011

## 2012-11-20 NOTE — Progress Notes (Signed)
Subjective:    Patient ID: Crystal Herrera, female    DOB: Jul 06, 1941, 72 y.o.   MRN: [id number]  HPI  Here to f/u with recurring LBP for 1-2 mo;  Tries to do swimming many days, had LBP in [facility] with sitting and swimming, saw ortho there where what sounds like ESI did not help, had PT with a 1 lb wt to the left leg with onset LLE worsening pain, and has "never been right" since that day, has been home about apr 1, and still with left LBP and LLE with radiation to the left foot, saw Dr Maurice Small (ortho with pain management only) with 2 or 3 lveel (she's not sure) cortisone; may have helped for a day, but then 2 days later [place], with onset right LBP andsome pain to RLE to just below the knee, mild to mod, mostly constant, no LE weakness or numbness,  Just still cannot swim. Overall pain about 4/10 RLE today, left is ok.  No MRI since 2013. Taking tylenol for pain, sometimes soma helps to go to bed at night. Pt denies chest pain, increased sob or doe, wheezing, orthopnea, PND, increased LE swelling, palpitations, dizziness or syncope.   Pt denies polydipsia, polyuria.  Has f/u with Dr Maurice Small next wk.  Pt without bowel or bladder change, fever, wt loss,  worsening gait change or falls.  Mainly here out of frustration regarding being slowed down by the pain and has gained a few lbs. Denies worsening depressive symptoms, suicidal ideation, or panic; Past Medical History  Diagnosis Date  . HYPERLIPIDEMIA 02/09/2010  . HYPERTENSION 02/09/2010  . GERD 02/09/2010  . SKIN LESION 03/22/2010  . OCCIPITAL NEURALGIA 06/09/2010  . BACK PAIN, CHRONIC 02/09/2010  . OSTEOPENIA 02/09/2010  . FATIGUE 02/09/2010  . Diarrhea 04/26/2010  . COLONIC POLYPS, HX OF 02/09/2010  . Lumbar disc disease   . Complication of anesthesia   . PONV (postoperative nausea and vomiting)   . DEPRESSION 02/09/2010    pt. takes Cymbalta for pain issues   . COMMON MIGRAINE 02/09/2010    neuritis in R side of  neck & head  . Neuromuscular disorder     neuritis in head & neck  . DEGENERATIVE JOINT DISEASE 02/09/2010    hands , lumbar stenosis   . Varicose vein of leg     BLE  . Pneumonia     "as an infant" (05/15/2012)  . Anemia 1948?   Past Surgical History  Procedure Laterality Date  . Chymopapain injection  1986    Lumbar  . Lumbar laminectomy  1986  . Ovarian cyst removal  2006  . Hernia repair  05/15/2012    ventral incisional   . Cholecystectomy  ? 2009  . Varicose vein surgery  ~ 2008    bilaterally  . Breast biopsy  1988    left; Neg  . Tubal ligation  1970's  . Dilation and curettage of uterus  1960's  . Ventral hernia repair  05/15/2012    Procedure: LAPAROSCOPIC VENTRAL HERNIA;  Surgeon: Atilano Ina, MD,FACS;  Location: [facility] OR;  Service: General;  Laterality: N/A;  laparoscopic ventral incisional hernia repair with mesh    reports that she quit smoking about 41 years ago. Her smoking use included Cigarettes. She has a 10 pack-year smoking history. She has never used smokeless tobacco. She reports that she drinks about 1.8 ounces of alcohol per week. She reports that she does not  use illicit drugs. family history includes Breast cancer in her maternal grandmother; Cancer in her maternal grandmother; Colon cancer in her maternal aunt; Diabetes in her father; and Lung cancer in her mother. Allergies  Allergen Reactions  . Penicillins Hives  . Sulfonamide Derivatives Hives  . Escitalopram     GI upset  . Vancomycin     rash   Current Outpatient Prescriptions on File Prior to Visit  Medication Sig Dispense Refill  . acetaminophen (TYLENOL) 325 MG tablet Take 650 mg by mouth every 6 (six) hours as needed. For pain      . calcium gluconate 500 MG tablet Take 500 mg by mouth 2 (two) times daily.        . diclofenac sodium (VOLTAREN) 1 % GEL Apply 1 application topically 4 (four) times daily.  9 Tube  11  . Evening Primrose Oil 1000 MG CAPS Take 1 capsule by mouth every  morning. Take one every morning per nurse and patient.      Marland Kitchen glucosamine-chondroitin 500-400 MG tablet Take 1 tablet by mouth 2 (two) times daily.        . hydrochlorothiazide (MICROZIDE) 12.5 MG capsule Take 1 capsule (12.5 mg total) by mouth daily after breakfast.  90 capsule  3  . nortriptyline (PAMELOR) 25 MG capsule Take 1 capsule (25 mg total) by mouth at bedtime.  90 capsule  3  . simvastatin (ZOCOR) 20 MG tablet Take 1 tablet by mouth at  bedtime  90 tablet  1   No current facility-administered medications on file prior to visit.   Review of Systems   Constitutional: Negative for unexpected weight change, or unusual diaphoresis  HENT: Negative for tinnitus.   Eyes: Negative for photophobia and visual disturbance.  Respiratory: Negative for choking and stridor.   Gastrointestinal: Negative for vomiting and blood in stool.  Genitourinary: Negative for hematuria and decreased urine volume.  Musculoskeletal: Negative for acute joint swelling Skin: Negative for color change and wound.  Neurological: Negative for tremors and numbness other than noted  Psychiatric/Behavioral: Negative for decreased concentration or  hyperactivity.       Objective:   Physical Exam BP 130/84  Pulse 80  Temp(Src) 98 F (36.7 C) (Oral)  Ht 5\' 5"  (1.651 m)  Wt 158 lb 8 oz (71.895 kg)  BMI 26.38 kg/m2  SpO2 94% VS noted,  Constitutional: Pt appears well-developed and well-nourished.  HENT: Head: NCAT.  Right Ear: External ear normal.  Left Ear: External ear normal.  Eyes: Conjunctivae and EOM are normal. Pupils are equal, round, and reactive to light.  Neck: Normal range of motion. Neck supple.  Cardiovascular: Normal rate and regular rhythm.   Pulmonary/Chest: Effort normal and breath sounds normal.  Abd:  Soft, NT, non-distended, + BS Neurological: Pt is alert. Not confused , motor 5/5 LE's, sens/dtr intact, gait steady Spine: nontender except for right lumbar paravertebral mild muscluar spasm  tender noted Skin: Skin is warm. No erythema.  Psychiatric: Pt behavior is normal. Thought content normal. mild nervous    Assessment & Plan:

## 2012-11-20 NOTE — Assessment & Plan Note (Signed)
With recent flare, ok for soma refill for bid prn use, ok to use the tramadol prn she has at home, not likely to benefit form further PT, to f/u pain management as planned, to start walking up to 45 min per day, but to avoid swimming for now until improved

## 2012-11-20 NOTE — Patient Instructions (Addendum)
OK to start walking up to 45 minutes per day Please continue all other medications as before, and refills have been done if requested, including the tramadol you have at home, and the soma as needed Please keep your appointments with your specialists as you have planned - Dr Maurice Small next week. Thank you for enrolling in MyChart. Please follow the instructions below to securely access your online medical record. MyChart allows you to send messages to your doctor, view your test results, renew your prescriptions, schedule appointments, and more.

## 2012-11-22 DIAGNOSIS — M5137 Other intervertebral disc degeneration, lumbosacral region: Secondary | ICD-10-CM | POA: Diagnosis not present

## 2012-11-22 DIAGNOSIS — M47817 Spondylosis without myelopathy or radiculopathy, lumbosacral region: Secondary | ICD-10-CM | POA: Diagnosis not present

## 2012-11-22 DIAGNOSIS — IMO0002 Reserved for concepts with insufficient information to code with codable children: Secondary | ICD-10-CM | POA: Diagnosis not present

## 2012-11-23 DIAGNOSIS — M47817 Spondylosis without myelopathy or radiculopathy, lumbosacral region: Secondary | ICD-10-CM | POA: Diagnosis not present

## 2012-11-29 DIAGNOSIS — M5137 Other intervertebral disc degeneration, lumbosacral region: Secondary | ICD-10-CM | POA: Diagnosis not present

## 2012-11-29 DIAGNOSIS — IMO0002 Reserved for concepts with insufficient information to code with codable children: Secondary | ICD-10-CM | POA: Diagnosis not present

## 2012-11-29 DIAGNOSIS — M47817 Spondylosis without myelopathy or radiculopathy, lumbosacral region: Secondary | ICD-10-CM | POA: Diagnosis not present

## 2012-12-06 DIAGNOSIS — M5137 Other intervertebral disc degeneration, lumbosacral region: Secondary | ICD-10-CM | POA: Diagnosis not present

## 2012-12-06 DIAGNOSIS — M47817 Spondylosis without myelopathy or radiculopathy, lumbosacral region: Secondary | ICD-10-CM | POA: Diagnosis not present

## 2012-12-06 DIAGNOSIS — IMO0002 Reserved for concepts with insufficient information to code with codable children: Secondary | ICD-10-CM | POA: Diagnosis not present

## 2012-12-21 DIAGNOSIS — IMO0002 Reserved for concepts with insufficient information to code with codable children: Secondary | ICD-10-CM | POA: Diagnosis not present

## 2012-12-21 DIAGNOSIS — M549 Dorsalgia, unspecified: Secondary | ICD-10-CM | POA: Diagnosis not present

## 2012-12-27 ENCOUNTER — Encounter (INDEPENDENT_AMBULATORY_CARE_PROVIDER_SITE_OTHER): Payer: Self-pay | Admitting: General Surgery

## 2013-01-03 ENCOUNTER — Ambulatory Visit (INDEPENDENT_AMBULATORY_CARE_PROVIDER_SITE_OTHER): Payer: Medicare Other | Admitting: General Surgery

## 2013-01-09 ENCOUNTER — Other Ambulatory Visit: Payer: Self-pay | Admitting: Neurosurgery

## 2013-01-09 DIAGNOSIS — M549 Dorsalgia, unspecified: Secondary | ICD-10-CM

## 2013-01-11 ENCOUNTER — Telehealth: Payer: Self-pay

## 2013-01-11 NOTE — Telephone Encounter (Signed)
Ok for off for now

## 2013-01-11 NOTE — Telephone Encounter (Signed)
Patient informed. 

## 2013-01-11 NOTE — Telephone Encounter (Signed)
Patient is going to cancel her myelogram as was told would need to be off nortriptyline for 3 days. She has been doing some reading on nortriptyline and would like to consider going off it completely, states has been on it for years.  Please advise if needs an OV or not to discuss.  She did state she is having accupuncture at this time for the back pain and is helping.  Call back number is [phone]

## 2013-01-14 DIAGNOSIS — M961 Postlaminectomy syndrome, not elsewhere classified: Secondary | ICD-10-CM | POA: Diagnosis not present

## 2013-01-14 DIAGNOSIS — G619 Inflammatory polyneuropathy, unspecified: Secondary | ICD-10-CM | POA: Diagnosis not present

## 2013-01-14 DIAGNOSIS — M549 Dorsalgia, unspecified: Secondary | ICD-10-CM | POA: Diagnosis not present

## 2013-01-14 DIAGNOSIS — G622 Polyneuropathy due to other toxic agents: Secondary | ICD-10-CM | POA: Diagnosis not present

## 2013-01-15 DIAGNOSIS — M461 Sacroiliitis, not elsewhere classified: Secondary | ICD-10-CM | POA: Diagnosis not present

## 2013-01-15 DIAGNOSIS — IMO0002 Reserved for concepts with insufficient information to code with codable children: Secondary | ICD-10-CM | POA: Diagnosis not present

## 2013-01-15 DIAGNOSIS — M999 Biomechanical lesion, unspecified: Secondary | ICD-10-CM | POA: Diagnosis not present

## 2013-01-26 DIAGNOSIS — H81399 Other peripheral vertigo, unspecified ear: Secondary | ICD-10-CM | POA: Diagnosis not present

## 2013-01-26 DIAGNOSIS — E7800 Pure hypercholesterolemia, unspecified: Secondary | ICD-10-CM | POA: Diagnosis not present

## 2013-01-26 DIAGNOSIS — G589 Mononeuropathy, unspecified: Secondary | ICD-10-CM | POA: Diagnosis not present

## 2013-01-26 DIAGNOSIS — R51 Headache: Secondary | ICD-10-CM | POA: Diagnosis not present

## 2013-01-26 DIAGNOSIS — Z881 Allergy status to other antibiotic agents status: Secondary | ICD-10-CM | POA: Diagnosis not present

## 2013-01-26 DIAGNOSIS — R42 Dizziness and giddiness: Secondary | ICD-10-CM | POA: Diagnosis not present

## 2013-01-26 DIAGNOSIS — Z882 Allergy status to sulfonamides status: Secondary | ICD-10-CM | POA: Diagnosis not present

## 2013-01-26 DIAGNOSIS — Z79899 Other long term (current) drug therapy: Secondary | ICD-10-CM | POA: Diagnosis not present

## 2013-01-26 DIAGNOSIS — Z880 Allergy status to penicillin: Secondary | ICD-10-CM | POA: Diagnosis not present

## 2013-01-26 DIAGNOSIS — Z888 Allergy status to other drugs, medicaments and biological substances status: Secondary | ICD-10-CM | POA: Diagnosis not present

## 2013-01-26 DIAGNOSIS — I10 Essential (primary) hypertension: Secondary | ICD-10-CM | POA: Diagnosis not present

## 2013-01-28 ENCOUNTER — Inpatient Hospital Stay: Admission: RE | Admit: 2013-01-28 | Payer: Self-pay | Source: Ambulatory Visit

## 2013-01-28 ENCOUNTER — Inpatient Hospital Stay
Admission: RE | Admit: 2013-01-28 | Discharge: 2013-01-28 | Disposition: A | Discharge status: Home / Self Care | Payer: Self-pay | Source: Ambulatory Visit | Attending: Neurosurgery | Admitting: Neurosurgery

## 2013-01-29 ENCOUNTER — Encounter: Payer: Self-pay | Admitting: Internal Medicine

## 2013-01-29 ENCOUNTER — Ambulatory Visit (INDEPENDENT_AMBULATORY_CARE_PROVIDER_SITE_OTHER): Payer: Self-pay | Admitting: Internal Medicine

## 2013-01-29 VITALS — BP 142/82 | HR 75 | Temp 97.3°F | Ht 65.0 in | Wt 157.1 lb

## 2013-01-29 DIAGNOSIS — R42 Dizziness and giddiness: Secondary | ICD-10-CM

## 2013-01-29 DIAGNOSIS — J309 Allergic rhinitis, unspecified: Secondary | ICD-10-CM | POA: Diagnosis not present

## 2013-01-29 DIAGNOSIS — I10 Essential (primary) hypertension: Secondary | ICD-10-CM

## 2013-01-29 HISTORY — DX: Allergic rhinitis, unspecified: J30.9

## 2013-01-29 MED ORDER — MECLIZINE HCL 25 MG PO TABS: 25.0000 mg | ORAL_TABLET | Freq: Three times a day (TID) | ORAL | Status: DC | PRN

## 2013-01-29 NOTE — Patient Instructions (Signed)
Please try the Nasonex at 1 spray/side per day Please call for prescription if this seems to help You can also take Mucinex (or it's generic off brand) for congestion, and tylenol as needed for pain. Your meclizine was refilled, and it should be ok to try the Valium if the symptoms worsen or persist Please continue all other medications as before, and refills have been done if requested. Please have the pharmacy call with any other refills you may need.  Please remember to sign up for My Chart if you have not done so, as this will be important to you in the future with finding out test results, communicating by private email, and scheduling acute appointments online when needed.

## 2013-01-29 NOTE — Progress Notes (Signed)
Subjective:    Patient ID: Crystal Herrera, female    DOB: 1941-05-19, 72 y.o.   MRN: [id number]  HPI  Here after just back from trip to [place] state, Does have several wks worsening nasal allergy symptoms with clearish congestion, itch and sneezing, without fever, pain, ST, cough, swelling or wheezing, but with worsening positional vertigo that could sometimes last for 2 hrs, debilitating, could not help drive home.  Pt denies chest pain, increased sob or doe, wheezing, orthopnea, PND, increased LE swelling, palpitations, dizziness or syncope.  Pt denies new neurological symptoms such as new headache, or facial or extremity weakness or numbness  Had recent cns imaging/mri - no stroke.  Pt denies fever, wt loss, night sweats, loss of appetite, or other constitutional symptoms. Tx with valium per er but has not tried  Past Medical History  Diagnosis Date  . HYPERLIPIDEMIA 02/09/2010  . HYPERTENSION 02/09/2010  . GERD 02/09/2010  . SKIN LESION 03/22/2010  . OCCIPITAL NEURALGIA 06/09/2010  . BACK PAIN, CHRONIC 02/09/2010  . OSTEOPENIA 02/09/2010  . FATIGUE 02/09/2010  . Diarrhea 04/26/2010  . COLONIC POLYPS, HX OF 02/09/2010  . Lumbar disc disease   . Complication of anesthesia   . PONV (postoperative nausea and vomiting)   . DEPRESSION 02/09/2010    pt. takes Cymbalta for pain issues   . COMMON MIGRAINE 02/09/2010    neuritis in R side of neck & head  . Neuromuscular disorder     neuritis in head & neck  . DEGENERATIVE JOINT DISEASE 02/09/2010    hands , lumbar stenosis   . Varicose vein of leg     BLE  . Pneumonia     "as an infant" (05/15/2012)  . Anemia 1948?  Marland Kitchen Allergic rhinitis, cause unspecified 01/29/2013   Past Surgical History  Procedure Laterality Date  . Chymopapain injection  1986    Lumbar  . Lumbar laminectomy  1986  . Ovarian cyst removal  2006  . Hernia repair  05/15/2012    ventral incisional   . Cholecystectomy  ? 2009  . Varicose vein surgery  ~ 2008    bilaterally  .  Breast biopsy  1988    left; Neg  . Tubal ligation  1970's  . Dilation and curettage of uterus  1960's  . Ventral hernia repair  05/15/2012    Procedure: LAPAROSCOPIC VENTRAL HERNIA;  Surgeon: Atilano Ina, MD,FACS;  Location: [facility] OR;  Service: General;  Laterality: N/A;  laparoscopic ventral incisional hernia repair with mesh    reports that she quit smoking about 41 years ago. Her smoking use included Cigarettes. She has a 10 pack-year smoking history. She has never used smokeless tobacco. She reports that she drinks about 1.8 ounces of alcohol per week. She reports that she does not use illicit drugs. family history includes Breast cancer in her maternal grandmother; Cancer in her maternal grandmother; Colon cancer in her maternal aunt; Diabetes in her father; and Lung cancer in her mother. Allergies  Allergen Reactions  . Penicillins Hives  . Sulfonamide Derivatives Hives  . Escitalopram     GI upset  . Vancomycin     rash   Current Outpatient Prescriptions on File Prior to Visit  Medication Sig Dispense Refill  . acetaminophen (TYLENOL) 325 MG tablet Take 650 mg by mouth every 6 (six) hours as needed. For pain      . calcium gluconate 500 MG tablet Take 500 mg by mouth 2 (two) times daily.        Marland Kitchen  carisoprodol (SOMA) 350 MG tablet Take 1 tablet (350 mg total) by mouth 2 (two) times daily.  180 tablet  1  . diclofenac sodium (VOLTAREN) 1 % GEL Apply 1 application topically 4 (four) times daily.  9 Tube  11  . Evening Primrose Oil 1000 MG CAPS Take 1 capsule by mouth every morning. Take one every morning per nurse and patient.      . hydrochlorothiazide (MICROZIDE) 12.5 MG capsule Take 1 capsule (12.5 mg total) by mouth daily after breakfast.  90 capsule  3  . simvastatin (ZOCOR) 20 MG tablet Take 1 tablet by mouth at  bedtime  90 tablet  1   No current facility-administered medications on file prior to visit.   Review of Systems  Constitutional: Negative for unexpected weight  change, or unusual diaphoresis  HENT: Negative for tinnitus.   Eyes: Negative for photophobia and visual disturbance.  Respiratory: Negative for choking and stridor.   Gastrointestinal: Negative for vomiting and blood in stool.  Genitourinary: Negative for hematuria and decreased urine volume.  Musculoskeletal: Negative for acute joint swelling Skin: Negative for color change and wound.  Neurological: Negative for tremors and numbness other than noted  Psychiatric/Behavioral: Negative for decreased concentration or  hyperactivity.       Objective:   Physical Exam BP 142/82  Pulse 75  Temp(Src) 97.3 F (36.3 C) (Oral)  Ht 5\' 5"  (1.651 m)  Wt 157 lb 2 oz (71.271 kg)  BMI 26.15 kg/m2  SpO2 96% VS noted,  Constitutional: Pt appears well-developed and well-nourished.  HENT: Head: NCAT.  Right Ear: External ear normal.  Left Ear: External ear normal.  tm with mild erythema - left only.  Max sinus areas non tender.  Pharynx with mild erythema, no exudate Eyes: Conjunctivae and EOM are normal. Pupils are equal, round, and reactive to light.  Neck: Normal range of motion. Neck supple.  Cardiovascular: Normal rate and regular rhythm.   Pulmonary/Chest: Effort normal and breath sounds normal.  Neurological: Pt is alert. Not confused  Skin: Skin is warm. No erythema.  Psychiatric: Pt behavior is normal. Thought content normal. mild nervous    Assessment & Plan:

## 2013-01-29 NOTE — Assessment & Plan Note (Signed)
Unclear etiology, exam with mild left TM erythema only; ? Allergy related/eustachian valve - for nasonex sample today, allegra and/or mucinex otc prn, refill melcizine, ok to try the valium if symptoms persists

## 2013-01-30 NOTE — Assessment & Plan Note (Signed)
For nasonex asd,  to f/u any worsening symptoms or concerns

## 2013-01-30 NOTE — Assessment & Plan Note (Signed)
stable overall by history and exam, recent data reviewed with pt, and pt to continue medical treatment as before,  to f/u any worsening symptoms or concerns BP Readings from Last 3 Encounters:  01/29/13 142/82  11/20/12 130/84  07/26/12 126/82

## 2013-01-31 DIAGNOSIS — M999 Biomechanical lesion, unspecified: Secondary | ICD-10-CM | POA: Diagnosis not present

## 2013-01-31 DIAGNOSIS — M461 Sacroiliitis, not elsewhere classified: Secondary | ICD-10-CM | POA: Diagnosis not present

## 2013-01-31 DIAGNOSIS — IMO0002 Reserved for concepts with insufficient information to code with codable children: Secondary | ICD-10-CM | POA: Diagnosis not present

## 2013-02-13 ENCOUNTER — Encounter (INDEPENDENT_AMBULATORY_CARE_PROVIDER_SITE_OTHER): Payer: Self-pay | Admitting: General Surgery

## 2013-02-13 ENCOUNTER — Ambulatory Visit (INDEPENDENT_AMBULATORY_CARE_PROVIDER_SITE_OTHER): Payer: Medicare Other | Admitting: General Surgery

## 2013-02-13 VITALS — BP 140/80 | HR 74 | Resp 16 | Ht 65.0 in | Wt 156.8 lb

## 2013-02-13 DIAGNOSIS — IMO0002 Reserved for concepts with insufficient information to code with codable children: Secondary | ICD-10-CM | POA: Diagnosis not present

## 2013-02-13 DIAGNOSIS — S39011A Strain of muscle, fascia and tendon of abdomen, initial encounter: Secondary | ICD-10-CM

## 2013-02-13 NOTE — Patient Instructions (Signed)
Avoid heavy lifting for at least 2 weeks Can take motrin for the discomfort (up to [3] 200mg  tablets at a time three times a day for several days) Call if doesn't improve after 4 weeks  Strain A strain is an injury to a muscle or the tissue that connects muscles to bones (tendon). In a strain injury, the muscle or tendon is either stretched or torn. Muscles are more susceptible to strains if they cross two joints, such as:  Hamstrings.  Quadriceps.  Calves.  Biceps. There are three categories of strains:  A first-degree strain is a small tear in the muscle. There is no lengthening of the muscle, but pain may be present with contraction of the muscle.  A second-degree strain is a small tear in the muscle accompanied by lengthening of the muscle. Muscles with a second-degree strain are still able to function.  A third-degree strain is a complete tear of the muscle. Muscles with a third-degree strain cannot function properly. Strains often have bleeding and bruising within the muscle. SYMPTOMS   Pain, tenderness, redness or bruising, and swelling in the area of injury.  Loss of normal mobility of the injured joint. CAUSES  A sudden force exerted on a muscle or tendon that it cannot withstand usually causes strains. This may be due to a sudden overload of a contracted muscle, overuse, or sudden increase or change in activity.  RISK INCREASES WITH:  Trauma.  Poor strength and flexibility.  Failure to warm-up properly before activity.  Return to activity before healing is complete. PREVENTION  Warm-up and stretch properly before and activity.  Maintain physical fitness:  Joint flexibility.  Muscle strength.  Endurance and conditioning.  Strengthen weak muscles with exercises to prevent recurrence. PROGNOSIS  If treated properly, strains are usually curable. The time it takes to recover is related to the severity of the injury and usually varies from 2 to 8  weeks. RELATED COMPLICATIONS   Re-injury or recurrence of symptoms, permanent weakness.  Joint stiffness if the strain is severe and rehabilitation is incomplete.  Delayed healing or resolution of symptoms if sports are resumed before rehabilitation is complete.  Excessive bleeding into muscle, especially if taking anti-inflammatory medicines. This can lead to delayed recovery and injury to nerves, muscle, and blood vessels; this is an emergency. TREATMENT  Treatment initially involves ice and medicine to help reduce pain and inflammation. Use of the affected muscle should be limited by a:  Brace.  Elastic bandage wrapping.  Splint.  Cast.  Sling. Strengthening and stretching exercises may be necessary after immobilization to prevent joint stiffness. These exercises may be completed at home or with a therapist. If the tendon is torn, then surgery may be necessary to repair it.  MEDICATION   Avoid aspirin or ibuprofen in the first 48 hours after the injury. These medicines may increase the tendency to bleed. During this time, you may take pain relievers, such as acetaminophen, that do not affect bleeding.  After the first 48 hours, if pain medicine is necessary, then nonsteroidal anti-inflammatory medicines, such as aspirin and ibuprofen, or other minor pain relievers, such as acetaminophen, are often recommended.  Do not take pain medicine within 7 days before surgery.  Prescription pain relievers may be prescribed. Use only as directed and only as much as you need  Ointments applied to the skin may be helpful. HEAT AND COLD  Cold treatment (icing) relieves pain and reduces inflammation. Cold treatment should be applied for 10 to 15 minutes  every 2 to 3 hours for inflammation and pain and immediately after any activity that aggravates your symptoms. Use ice packs or massage the area with a piece of ice (ice massage).  Heat treatment may be used prior to performing the stretching  and strengthening activities prescribed by your caregiver, physical therapist, or athletic trainer. Use a heat pack or soak your injury in warm water. SEEK MEDICAL CARE IF:   Symptoms get worse or do not improve despite treatment.  Pain becomes intolerable.  You experience numbness or tingling.  Toes or fingernails become cold or develop a blue, gray, or dusky color.  New, unexplained symptoms develop (drugs used in treatment may produce side effects). Document Released: 07/11/2005 Document Revised: 10/03/2011 Document Reviewed: 10/23/2008 [facility] Patient Information 2014 [place], [place].

## 2013-02-13 NOTE — Progress Notes (Signed)
Subjective:     Patient ID: Crystal Herrera, female   DOB: 1940/08/23, 72 y.o.   MRN: [id number]  HPI  72 year old Caucasian female comes in for long-term followup regarding laparoscopic repair of a ventral incisional hernia with mesh in October 2013. I last saw her in the office in January. She comes in primarily because of recurrence of some right-sided abdominal pain that happened on Sunday and Monday of this week. She had been pain-free without any issues. However on Sunday evening she noticed some mild discomfort in her right upper abdomen. She states that she had difficult time sleeping that night. She states that she placed a pillow over the area and pressed on it it would help with the discomfort. She has similar issue Monday. However since Tuesday the area has significantly improved. It really isn't bothering her right now. She denies any fever, chills, diarrhea, constipation. She denies any trauma to the area. She denies any aggressive activity over the weekend. She has been battling vertigo since June. She states that getting somewhat better.  PMHx, PSHx, SOCHx, FAMHx, ALL reviewed   Review of Systems 10 point review of systems is performed and all systems are negative except for what is mentioned in the history of present illness    Objective:   Physical Exam BP 140/80  Pulse 74  Resp 16  Ht 5\' 5"  (1.651 m)  Wt 156 lb 12.8 oz (71.124 kg)  BMI 26.09 kg/m2  Gen: alert, NAD, non-toxic appearing Pupils: equal, no scleral icterus Pulm: Lungs clear to auscultation, symmetric chest rise CV: regular rate and rhythm Abd: soft, nontender, nondistended. Well-healed trocar sites. No cellulitis. No incisional hernia Ext: no edema, no calf tenderness Skin: no rash, no jaundice     Assessment:     Status post laparoscopic repair of ventral incisional hernia with mesh Right upper quadrant abdominal wall pain     Plan:     I think this is most consistent with abdominal wall strain.  There is no signs of recurrent hernia. There is nooverlying skin changes. The fact that it is getting better is reassuring. Since it is improving I do not see any need for imaging. Advised her to avoid heavy lifting for at least 2-4 weeks. I told her she could take Motrin or ibuprofen as needed for the some of the discomfort. We did discuss the risk of GI upset with ibuprofen. Followup as needed  [name]. [name], MD, FACS General, Bariatric, & Minimally Invasive Surgery [facility], [place]

## 2013-02-18 DIAGNOSIS — M999 Biomechanical lesion, unspecified: Secondary | ICD-10-CM | POA: Diagnosis not present

## 2013-02-18 DIAGNOSIS — IMO0002 Reserved for concepts with insufficient information to code with codable children: Secondary | ICD-10-CM | POA: Diagnosis not present

## 2013-02-18 DIAGNOSIS — M461 Sacroiliitis, not elsewhere classified: Secondary | ICD-10-CM | POA: Diagnosis not present

## 2013-02-21 DIAGNOSIS — H6120 Impacted cerumen, unspecified ear: Secondary | ICD-10-CM | POA: Diagnosis not present

## 2013-02-21 DIAGNOSIS — H8110 Benign paroxysmal vertigo, unspecified ear: Secondary | ICD-10-CM | POA: Diagnosis not present

## 2013-03-20 DIAGNOSIS — C4401 Basal cell carcinoma of skin of lip: Secondary | ICD-10-CM | POA: Diagnosis not present

## 2013-03-20 DIAGNOSIS — D485 Neoplasm of uncertain behavior of skin: Secondary | ICD-10-CM | POA: Diagnosis not present

## 2013-04-01 ENCOUNTER — Other Ambulatory Visit: Payer: Self-pay | Admitting: Neurosurgery

## 2013-04-01 DIAGNOSIS — IMO0002 Reserved for concepts with insufficient information to code with codable children: Secondary | ICD-10-CM | POA: Diagnosis not present

## 2013-04-01 DIAGNOSIS — M999 Biomechanical lesion, unspecified: Secondary | ICD-10-CM | POA: Diagnosis not present

## 2013-04-01 DIAGNOSIS — M461 Sacroiliitis, not elsewhere classified: Secondary | ICD-10-CM | POA: Diagnosis not present

## 2013-04-01 DIAGNOSIS — M549 Dorsalgia, unspecified: Secondary | ICD-10-CM

## 2013-04-08 DIAGNOSIS — IMO0002 Reserved for concepts with insufficient information to code with codable children: Secondary | ICD-10-CM | POA: Diagnosis not present

## 2013-04-08 DIAGNOSIS — M47817 Spondylosis without myelopathy or radiculopathy, lumbosacral region: Secondary | ICD-10-CM | POA: Diagnosis not present

## 2013-04-10 ENCOUNTER — Ambulatory Visit
Admission: RE | Admit: 2013-04-10 | Discharge: 2013-04-10 | Disposition: A | Discharge status: Home / Self Care | Payer: Medicare Other | Source: Ambulatory Visit | Attending: Neurosurgery | Admitting: Neurosurgery

## 2013-04-10 VITALS — BP 134/73 | HR 66

## 2013-04-10 DIAGNOSIS — M549 Dorsalgia, unspecified: Secondary | ICD-10-CM

## 2013-04-10 DIAGNOSIS — M47817 Spondylosis without myelopathy or radiculopathy, lumbosacral region: Secondary | ICD-10-CM | POA: Diagnosis not present

## 2013-04-10 MED ORDER — DIAZEPAM 5 MG PO TABS
5.0000 mg | ORAL_TABLET | Freq: Once | ORAL | Status: AC
  Administered 2013-04-10: 5 mg via ORAL

## 2013-04-10 MED ORDER — IOHEXOL 180 MG/ML  SOLN
18.0000 mL | Freq: Once | INTRAMUSCULAR | Status: AC | PRN
  Administered 2013-04-10: 18 mL via INTRATHECAL

## 2013-04-19 DIAGNOSIS — Z1231 Encounter for screening mammogram for malignant neoplasm of breast: Secondary | ICD-10-CM | POA: Diagnosis not present

## 2013-04-24 LAB — HM MAMMOGRAPHY

## 2013-04-25 DIAGNOSIS — IMO0002 Reserved for concepts with insufficient information to code with codable children: Secondary | ICD-10-CM | POA: Diagnosis not present

## 2013-04-25 DIAGNOSIS — M5126 Other intervertebral disc displacement, lumbar region: Secondary | ICD-10-CM | POA: Diagnosis not present

## 2013-04-26 DIAGNOSIS — C4401 Basal cell carcinoma of skin of lip: Secondary | ICD-10-CM | POA: Diagnosis not present

## 2013-04-30 DIAGNOSIS — M549 Dorsalgia, unspecified: Secondary | ICD-10-CM | POA: Diagnosis not present

## 2013-05-08 DIAGNOSIS — M999 Biomechanical lesion, unspecified: Secondary | ICD-10-CM | POA: Diagnosis not present

## 2013-05-08 DIAGNOSIS — Z85828 Personal history of other malignant neoplasm of skin: Secondary | ICD-10-CM | POA: Diagnosis not present

## 2013-05-08 DIAGNOSIS — L708 Other acne: Secondary | ICD-10-CM | POA: Diagnosis not present

## 2013-05-08 DIAGNOSIS — M9981 Other biomechanical lesions of cervical region: Secondary | ICD-10-CM | POA: Diagnosis not present

## 2013-05-08 DIAGNOSIS — IMO0001 Reserved for inherently not codable concepts without codable children: Secondary | ICD-10-CM | POA: Diagnosis not present

## 2013-05-08 DIAGNOSIS — M5020 Other cervical disc displacement, unspecified cervical region: Secondary | ICD-10-CM | POA: Diagnosis not present

## 2013-05-08 DIAGNOSIS — D235 Other benign neoplasm of skin of trunk: Secondary | ICD-10-CM | POA: Diagnosis not present

## 2013-05-09 DIAGNOSIS — H2510 Age-related nuclear cataract, unspecified eye: Secondary | ICD-10-CM | POA: Diagnosis not present

## 2013-05-13 DIAGNOSIS — M5126 Other intervertebral disc displacement, lumbar region: Secondary | ICD-10-CM | POA: Diagnosis not present

## 2013-05-13 DIAGNOSIS — IMO0002 Reserved for concepts with insufficient information to code with codable children: Secondary | ICD-10-CM | POA: Diagnosis not present

## 2013-05-20 DIAGNOSIS — IMO0002 Reserved for concepts with insufficient information to code with codable children: Secondary | ICD-10-CM | POA: Diagnosis not present

## 2013-05-21 ENCOUNTER — Encounter (HOSPITAL_COMMUNITY): Payer: Self-pay | Admitting: Pharmacy Technician

## 2013-05-21 ENCOUNTER — Encounter (HOSPITAL_COMMUNITY): Payer: Self-pay | Admitting: *Deleted

## 2013-05-21 ENCOUNTER — Emergency Department (HOSPITAL_COMMUNITY)
Admission: EM | Admit: 2013-05-21 | Discharge: 2013-05-22 | Disposition: A | Discharge status: Home / Self Care | Payer: Medicare Other | Source: Home / Self Care | Attending: Emergency Medicine | Admitting: Emergency Medicine

## 2013-05-21 ENCOUNTER — Encounter (HOSPITAL_COMMUNITY): Payer: Self-pay | Admitting: Emergency Medicine

## 2013-05-21 ENCOUNTER — Other Ambulatory Visit (HOSPITAL_COMMUNITY): Payer: Self-pay | Admitting: *Deleted

## 2013-05-21 ENCOUNTER — Other Ambulatory Visit: Payer: Self-pay | Admitting: Orthopedic Surgery

## 2013-05-21 DIAGNOSIS — Z862 Personal history of diseases of the blood and blood-forming organs and certain disorders involving the immune mechanism: Secondary | ICD-10-CM | POA: Insufficient documentation

## 2013-05-21 DIAGNOSIS — Z8679 Personal history of other diseases of the circulatory system: Secondary | ICD-10-CM | POA: Insufficient documentation

## 2013-05-21 DIAGNOSIS — Z8659 Personal history of other mental and behavioral disorders: Secondary | ICD-10-CM | POA: Insufficient documentation

## 2013-05-21 DIAGNOSIS — G8929 Other chronic pain: Secondary | ICD-10-CM | POA: Insufficient documentation

## 2013-05-21 DIAGNOSIS — Z8719 Personal history of other diseases of the digestive system: Secondary | ICD-10-CM | POA: Insufficient documentation

## 2013-05-21 DIAGNOSIS — Z880 Allergy status to penicillin: Secondary | ICD-10-CM | POA: Insufficient documentation

## 2013-05-21 DIAGNOSIS — R251 Tremor, unspecified: Secondary | ICD-10-CM

## 2013-05-21 DIAGNOSIS — F329 Major depressive disorder, single episode, unspecified: Secondary | ICD-10-CM | POA: Diagnosis present

## 2013-05-21 DIAGNOSIS — I10 Essential (primary) hypertension: Secondary | ICD-10-CM | POA: Diagnosis present

## 2013-05-21 DIAGNOSIS — R259 Unspecified abnormal involuntary movements: Secondary | ICD-10-CM | POA: Insufficient documentation

## 2013-05-21 DIAGNOSIS — Z87891 Personal history of nicotine dependence: Secondary | ICD-10-CM

## 2013-05-21 DIAGNOSIS — R6889 Other general symptoms and signs: Secondary | ICD-10-CM | POA: Diagnosis not present

## 2013-05-21 DIAGNOSIS — Z860100 Personal history of colon polyps, unspecified: Secondary | ICD-10-CM | POA: Insufficient documentation

## 2013-05-21 DIAGNOSIS — E785 Hyperlipidemia, unspecified: Secondary | ICD-10-CM | POA: Diagnosis present

## 2013-05-21 DIAGNOSIS — Z8701 Personal history of pneumonia (recurrent): Secondary | ICD-10-CM | POA: Insufficient documentation

## 2013-05-21 DIAGNOSIS — R112 Nausea with vomiting, unspecified: Secondary | ICD-10-CM | POA: Diagnosis not present

## 2013-05-21 DIAGNOSIS — Z872 Personal history of diseases of the skin and subcutaneous tissue: Secondary | ICD-10-CM | POA: Insufficient documentation

## 2013-05-21 DIAGNOSIS — R1110 Vomiting, unspecified: Secondary | ICD-10-CM

## 2013-05-21 DIAGNOSIS — Z79899 Other long term (current) drug therapy: Secondary | ICD-10-CM | POA: Insufficient documentation

## 2013-05-21 DIAGNOSIS — F3289 Other specified depressive episodes: Secondary | ICD-10-CM | POA: Diagnosis present

## 2013-05-21 DIAGNOSIS — M5126 Other intervertebral disc displacement, lumbar region: Principal | ICD-10-CM | POA: Diagnosis present

## 2013-05-21 DIAGNOSIS — M1990 Unspecified osteoarthritis, unspecified site: Secondary | ICD-10-CM | POA: Diagnosis present

## 2013-05-21 DIAGNOSIS — K5900 Constipation, unspecified: Secondary | ICD-10-CM | POA: Diagnosis not present

## 2013-05-21 DIAGNOSIS — Z8739 Personal history of other diseases of the musculoskeletal system and connective tissue: Secondary | ICD-10-CM | POA: Insufficient documentation

## 2013-05-21 DIAGNOSIS — R6883 Chills (without fever): Secondary | ICD-10-CM | POA: Insufficient documentation

## 2013-05-21 DIAGNOSIS — Z23 Encounter for immunization: Secondary | ICD-10-CM

## 2013-05-21 DIAGNOSIS — K219 Gastro-esophageal reflux disease without esophagitis: Secondary | ICD-10-CM | POA: Diagnosis present

## 2013-05-21 DIAGNOSIS — M899 Disorder of bone, unspecified: Secondary | ICD-10-CM | POA: Diagnosis present

## 2013-05-21 LAB — CBC WITH DIFFERENTIAL/PLATELET
Basophils Absolute: 0 10*3/uL (ref 0.0–0.1)
Basophils Relative: 0 % (ref 0–1)
Eosinophils Absolute: 0 10*3/uL (ref 0.0–0.7)
Eosinophils Relative: 0 % (ref 0–5)
HCT: 42.7 % (ref 36.0–46.0)
Hemoglobin: 14.9 g/dL (ref 12.0–15.0)
Lymphocytes Relative: 8 % — ABNORMAL LOW (ref 12–46)
Lymphs Abs: 0.8 10*3/uL (ref 0.7–4.0)
MCH: 32 pg (ref 26.0–34.0)
MCHC: 34.9 g/dL (ref 30.0–36.0)
MCV: 91.8 fL (ref 78.0–100.0)
Monocytes Absolute: 0.5 10*3/uL (ref 0.1–1.0)
Monocytes Relative: 5 % (ref 3–12)
Neutro Abs: 8.8 10*3/uL — ABNORMAL HIGH (ref 1.7–7.7)
Neutrophils Relative %: 87 % — ABNORMAL HIGH (ref 43–77)
Platelets: 248 10*3/uL (ref 150–400)
RBC: 4.65 MIL/uL (ref 3.87–5.11)
RDW: 13.1 % (ref 11.5–15.5)
WBC: 10.2 10*3/uL (ref 4.0–10.5)

## 2013-05-21 LAB — COMPREHENSIVE METABOLIC PANEL
ALT: 14 U/L (ref 0–35)
AST: 16 U/L (ref 0–37)
Albumin: 4.2 g/dL (ref 3.5–5.2)
Alkaline Phosphatase: 59 U/L (ref 39–117)
BUN: 13 mg/dL (ref 6–23)
CO2: 28 mEq/L (ref 19–32)
Calcium: 9.4 mg/dL (ref 8.4–10.5)
Chloride: 103 mEq/L (ref 96–112)
Creatinine, Ser: 0.76 mg/dL (ref 0.50–1.10)
GFR calc Af Amer: >90 mL/min (ref >90)
GFR calc non Af Amer: 82 mL/min — ABNORMAL LOW (ref >90)
Glucose, Bld: 136 mg/dL — ABNORMAL HIGH (ref 70–99)
Potassium: 3.4 mEq/L — ABNORMAL LOW (ref 3.5–5.1)
Sodium: 142 mEq/L (ref 135–145)
Total Bilirubin: 0.3 mg/dL (ref 0.3–1.2)
Total Protein: 6.7 g/dL (ref 6.0–8.3)

## 2013-05-21 LAB — URINALYSIS, ROUTINE W REFLEX MICROSCOPIC
Bilirubin Urine: NEGATIVE
Glucose, UA: NEGATIVE mg/dL
Ketones, ur: 15 mg/dL — AB
Nitrite: NEGATIVE
Protein, ur: NEGATIVE mg/dL
Specific Gravity, Urine: 1.016 (ref 1.005–1.030)
Urobilinogen, UA: 0.2 mg/dL (ref 0.0–1.0)
pH: 5 (ref 5.0–8.0)

## 2013-05-21 LAB — LIPASE, BLOOD: Lipase: 50 U/L (ref 11–59)

## 2013-05-21 LAB — CG4 I-STAT (LACTIC ACID): Lactic Acid, Venous: 1.18 mmol/L (ref 0.5–2.2)

## 2013-05-21 MED ORDER — SODIUM CHLORIDE 0.9 % IV BOLUS (SEPSIS)
1000.0000 mL | Freq: Once | INTRAVENOUS | Status: AC
  Administered 2013-05-21: 1000 mL via INTRAVENOUS

## 2013-05-21 MED ORDER — ONDANSETRON HCL 4 MG/2ML IJ SOLN
4.0000 mg | Freq: Once | INTRAMUSCULAR | Status: AC
  Administered 2013-05-21: 4 mg via INTRAVENOUS
  Filled 2013-05-21: qty 2

## 2013-05-21 NOTE — ED Notes (Signed)
Patient received flu vaccine today from [facility], now with nausea and vomiting and muscle spasms.  Patient was given 4mg  of Zofran enroute to ED.  All symptoms have now subsided.

## 2013-05-22 ENCOUNTER — Ambulatory Visit: Payer: Medicare Other | Admitting: Internal Medicine

## 2013-05-22 ENCOUNTER — Inpatient Hospital Stay (HOSPITAL_COMMUNITY): Payer: Medicare Other

## 2013-05-22 ENCOUNTER — Encounter (HOSPITAL_COMMUNITY)
Admission: RE | Disposition: A | Discharge status: Home Health | Payer: Medicare Other | Source: Ambulatory Visit | Attending: Orthopedic Surgery

## 2013-05-22 ENCOUNTER — Encounter (HOSPITAL_COMMUNITY): Payer: Self-pay | Admitting: Anesthesiology

## 2013-05-22 ENCOUNTER — Inpatient Hospital Stay (HOSPITAL_COMMUNITY): Payer: Medicare Other | Admitting: Anesthesiology

## 2013-05-22 ENCOUNTER — Inpatient Hospital Stay (HOSPITAL_COMMUNITY)
Admission: RE | Admit: 2013-05-22 | Discharge: 2013-05-25 | DRG: 460 | Disposition: A | Discharge status: Home Health | Payer: Medicare Other | Source: Ambulatory Visit | Attending: Orthopedic Surgery | Admitting: Orthopedic Surgery

## 2013-05-22 ENCOUNTER — Encounter (HOSPITAL_COMMUNITY): Payer: Medicare Other | Admitting: Anesthesiology

## 2013-05-22 DIAGNOSIS — M79609 Pain in unspecified limb: Secondary | ICD-10-CM | POA: Diagnosis not present

## 2013-05-22 DIAGNOSIS — I10 Essential (primary) hypertension: Secondary | ICD-10-CM | POA: Diagnosis not present

## 2013-05-22 DIAGNOSIS — Z79899 Other long term (current) drug therapy: Secondary | ICD-10-CM | POA: Diagnosis not present

## 2013-05-22 DIAGNOSIS — F329 Major depressive disorder, single episode, unspecified: Secondary | ICD-10-CM | POA: Diagnosis present

## 2013-05-22 DIAGNOSIS — M539 Dorsopathy, unspecified: Secondary | ICD-10-CM | POA: Diagnosis not present

## 2013-05-22 DIAGNOSIS — M549 Dorsalgia, unspecified: Secondary | ICD-10-CM

## 2013-05-22 DIAGNOSIS — M1990 Unspecified osteoarthritis, unspecified site: Secondary | ICD-10-CM | POA: Diagnosis not present

## 2013-05-22 DIAGNOSIS — IMO0002 Reserved for concepts with insufficient information to code with codable children: Secondary | ICD-10-CM | POA: Diagnosis not present

## 2013-05-22 DIAGNOSIS — M899 Disorder of bone, unspecified: Secondary | ICD-10-CM | POA: Diagnosis present

## 2013-05-22 DIAGNOSIS — K5900 Constipation, unspecified: Secondary | ICD-10-CM | POA: Diagnosis not present

## 2013-05-22 DIAGNOSIS — K219 Gastro-esophageal reflux disease without esophagitis: Secondary | ICD-10-CM | POA: Diagnosis present

## 2013-05-22 DIAGNOSIS — Z23 Encounter for immunization: Secondary | ICD-10-CM | POA: Diagnosis not present

## 2013-05-22 DIAGNOSIS — M48062 Spinal stenosis, lumbar region with neurogenic claudication: Secondary | ICD-10-CM | POA: Diagnosis not present

## 2013-05-22 DIAGNOSIS — E785 Hyperlipidemia, unspecified: Secondary | ICD-10-CM | POA: Diagnosis not present

## 2013-05-22 DIAGNOSIS — M519 Unspecified thoracic, thoracolumbar and lumbosacral intervertebral disc disorder: Secondary | ICD-10-CM | POA: Diagnosis not present

## 2013-05-22 DIAGNOSIS — Z01818 Encounter for other preprocedural examination: Secondary | ICD-10-CM | POA: Diagnosis not present

## 2013-05-22 DIAGNOSIS — M5126 Other intervertebral disc displacement, lumbar region: Secondary | ICD-10-CM | POA: Diagnosis not present

## 2013-05-22 DIAGNOSIS — M545 Low back pain: Secondary | ICD-10-CM | POA: Diagnosis not present

## 2013-05-22 DIAGNOSIS — Z87891 Personal history of nicotine dependence: Secondary | ICD-10-CM | POA: Diagnosis not present

## 2013-05-22 HISTORY — PX: TRANSFORAMINAL LUMBAR INTERBODY FUSION (TLIF) WITH PEDICLE SCREW FIXATION 1 LEVEL: SHX6141

## 2013-05-22 HISTORY — PX: LUMBAR FUSION: SHX111

## 2013-05-22 LAB — COMPREHENSIVE METABOLIC PANEL
ALT: 12 U/L (ref 0–35)
AST: 16 U/L (ref 0–37)
Albumin: 4 g/dL (ref 3.5–5.2)
Alkaline Phosphatase: 55 U/L (ref 39–117)
BUN: 12 mg/dL (ref 6–23)
CO2: 28 mEq/L (ref 19–32)
Calcium: 9.1 mg/dL (ref 8.4–10.5)
Chloride: 103 mEq/L (ref 96–112)
Creatinine, Ser: 0.82 mg/dL (ref 0.50–1.10)
GFR calc Af Amer: 80 mL/min — ABNORMAL LOW (ref >90)
GFR calc non Af Amer: 69 mL/min — ABNORMAL LOW (ref >90)
Glucose, Bld: 98 mg/dL (ref 70–99)
Potassium: 3.4 mEq/L — ABNORMAL LOW (ref 3.5–5.1)
Sodium: 142 mEq/L (ref 135–145)
Total Bilirubin: 0.5 mg/dL (ref 0.3–1.2)
Total Protein: 6.8 g/dL (ref 6.0–8.3)

## 2013-05-22 LAB — URINE MICROSCOPIC-ADD ON

## 2013-05-22 LAB — SURGICAL PCR SCREEN
MRSA, PCR: NEGATIVE
Staphylococcus aureus: NEGATIVE

## 2013-05-22 LAB — TYPE AND SCREEN
ABO/RH(D): B POS
Antibody Screen: NEGATIVE

## 2013-05-22 LAB — ABO/RH: ABO/RH(D): B POS

## 2013-05-22 LAB — PROTIME-INR
INR: 0.97 (ref 0.00–1.49)
Prothrombin Time: 12.7 seconds (ref 11.6–15.2)

## 2013-05-22 LAB — APTT: aPTT: 28 seconds (ref 24–37)

## 2013-05-22 SURGERY — TRANSFORAMINAL LUMBAR INTERBODY FUSION (TLIF) WITH PEDICLE SCREW FIXATION 1 LEVEL
Anesthesia: General | Laterality: Left

## 2013-05-22 MED ORDER — PROPOFOL INFUSION 10 MG/ML OPTIME
INTRAVENOUS | Status: DC | PRN
  Administered 2013-05-22: 50 ug/kg/min via INTRAVENOUS

## 2013-05-22 MED ORDER — MIDAZOLAM HCL 2 MG/2ML IJ SOLN
INTRAMUSCULAR | Status: AC
  Filled 2013-05-22: qty 2

## 2013-05-22 MED ORDER — OXYCODONE HCL 5 MG PO TABS: 5.0000 mg | ORAL_TABLET | Freq: Once | ORAL | Status: DC | PRN

## 2013-05-22 MED ORDER — THROMBIN 20000 UNITS EX SOLR
CUTANEOUS | Status: AC
  Filled 2013-05-22: qty 20000

## 2013-05-22 MED ORDER — MUPIROCIN 2 % EX OINT
TOPICAL_OINTMENT | CUTANEOUS | Status: AC
  Administered 2013-05-22: 1 via NASAL
  Filled 2013-05-22: qty 22

## 2013-05-22 MED ORDER — PHENOL 1.4 % MT LIQD: 1.0000 | OROMUCOSAL | Status: DC | PRN

## 2013-05-22 MED ORDER — CALCIUM GLUCONATE 500 MG PO TABS
500.0000 mg | ORAL_TABLET | Freq: Two times a day (BID) | ORAL | Status: DC
  Administered 2013-05-22 – 2013-05-25 (×6): 500 mg via ORAL
  Filled 2013-05-22 (×7): qty 1

## 2013-05-22 MED ORDER — SUFENTANIL CITRATE 50 MCG/ML IV SOLN
INTRAVENOUS | Status: DC | PRN
  Administered 2013-05-22 (×4): 10 ug via INTRAVENOUS

## 2013-05-22 MED ORDER — THROMBIN 20000 UNITS EX KIT
PACK | CUTANEOUS | Status: DC | PRN
  Administered 2013-05-22: 13:00:00 via TOPICAL

## 2013-05-22 MED ORDER — NEOSTIGMINE METHYLSULFATE 1 MG/ML IJ SOLN
INTRAMUSCULAR | Status: DC | PRN
  Administered 2013-05-22: 3 mg via INTRAVENOUS

## 2013-05-22 MED ORDER — MIDAZOLAM HCL 5 MG/5ML IJ SOLN
INTRAMUSCULAR | Status: DC | PRN
  Administered 2013-05-22 (×2): 1 mg via INTRAVENOUS

## 2013-05-22 MED ORDER — ONDANSETRON HCL 4 MG/2ML IJ SOLN
INTRAMUSCULAR | Status: AC
  Filled 2013-05-22: qty 2

## 2013-05-22 MED ORDER — THROMBIN 20000 UNITS EX SOLR
CUTANEOUS | Status: DC | PRN
  Administered 2013-05-22: 20000 [IU] via TOPICAL

## 2013-05-22 MED ORDER — ALBUMIN HUMAN 5 % IV SOLN
INTRAVENOUS | Status: DC | PRN
  Administered 2013-05-22: 14:00:00 via INTRAVENOUS

## 2013-05-22 MED ORDER — BISACODYL 5 MG PO TBEC: 5.0000 mg | DELAYED_RELEASE_TABLET | Freq: Every day | ORAL | Status: DC | PRN

## 2013-05-22 MED ORDER — PROMETHAZINE HCL 25 MG/ML IJ SOLN
INTRAMUSCULAR | Status: AC
  Filled 2013-05-22: qty 1

## 2013-05-22 MED ORDER — NALOXONE HCL 0.4 MG/ML IJ SOLN: 0.4000 mg | INTRAMUSCULAR | Status: DC | PRN

## 2013-05-22 MED ORDER — ZOLPIDEM TARTRATE 5 MG PO TABS: 5.0000 mg | ORAL_TABLET | Freq: Every evening | ORAL | Status: DC | PRN

## 2013-05-22 MED ORDER — SIMVASTATIN 20 MG PO TABS
20.0000 mg | ORAL_TABLET | Freq: Every day | ORAL | Status: DC
  Administered 2013-05-22 – 2013-05-24 (×3): 20 mg via ORAL
  Filled 2013-05-22 (×4): qty 1

## 2013-05-22 MED ORDER — HYDROMORPHONE HCL PF 1 MG/ML IJ SOLN
INTRAMUSCULAR | Status: AC
  Filled 2013-05-22: qty 1

## 2013-05-22 MED ORDER — OXYCODONE HCL 5 MG/5ML PO SOLN: 5.0000 mg | Freq: Once | ORAL | Status: DC | PRN

## 2013-05-22 MED ORDER — PROMETHAZINE HCL 25 MG/ML IJ SOLN
6.2500 mg | INTRAMUSCULAR | Status: DC | PRN
  Administered 2013-05-22: 6.25 mg via INTRAVENOUS

## 2013-05-22 MED ORDER — CLINDAMYCIN PHOSPHATE 600 MG/50ML IV SOLN
600.0000 mg | Freq: Once | INTRAVENOUS | Status: AC
  Administered 2013-05-22: 600 mg via INTRAVENOUS
  Filled 2013-05-22: qty 50

## 2013-05-22 MED ORDER — MORPHINE SULFATE 2 MG/ML IJ SOLN
1.0000 mg | INTRAMUSCULAR | Status: DC | PRN
  Administered 2013-05-23: 2 mg via INTRAVENOUS
  Filled 2013-05-22: qty 1

## 2013-05-22 MED ORDER — VANCOMYCIN HCL IN DEXTROSE 1-5 GM/200ML-% IV SOLN: 1000.0000 mg | INTRAVENOUS | Status: DC

## 2013-05-22 MED ORDER — MENTHOL 3 MG MT LOZG: 1.0000 | LOZENGE | OROMUCOSAL | Status: DC | PRN

## 2013-05-22 MED ORDER — ARTIFICIAL TEARS OP OINT
TOPICAL_OINTMENT | OPHTHALMIC | Status: DC | PRN
  Administered 2013-05-22: 1 via OPHTHALMIC

## 2013-05-22 MED ORDER — ONDANSETRON HCL 4 MG/2ML IJ SOLN: 4.0000 mg | Freq: Four times a day (QID) | INTRAMUSCULAR | Status: DC | PRN

## 2013-05-22 MED ORDER — SODIUM CHLORIDE 0.9 % IV SOLN
INTRAVENOUS | Status: DC
  Administered 2013-05-22 – 2013-05-23 (×2): via INTRAVENOUS

## 2013-05-22 MED ORDER — ACETAMINOPHEN 325 MG PO TABS
650.0000 mg | ORAL_TABLET | ORAL | Status: DC | PRN
  Administered 2013-05-23 – 2013-05-24 (×2): 650 mg via ORAL
  Filled 2013-05-22 (×2): qty 2

## 2013-05-22 MED ORDER — ALUM & MAG HYDROXIDE-SIMETH 200-200-20 MG/5ML PO SUSP: 30.0000 mL | Freq: Four times a day (QID) | ORAL | Status: DC | PRN

## 2013-05-22 MED ORDER — LACTATED RINGERS IV SOLN
INTRAVENOUS | Status: DC | PRN
  Administered 2013-05-22 (×3): via INTRAVENOUS

## 2013-05-22 MED ORDER — BUPIVACAINE-EPINEPHRINE PF 0.25-1:200000 % IJ SOLN
INTRAMUSCULAR | Status: AC
  Filled 2013-05-22: qty 30

## 2013-05-22 MED ORDER — BUPIVACAINE-EPINEPHRINE 0.25% -1:200000 IJ SOLN
INTRAMUSCULAR | Status: DC | PRN
  Administered 2013-05-22: 26 mL

## 2013-05-22 MED ORDER — SODIUM CHLORIDE 0.9 % IJ SOLN: 9.0000 mL | INTRAMUSCULAR | Status: DC | PRN

## 2013-05-22 MED ORDER — STERILE WATER FOR IRRIGATION IR SOLN
Status: DC | PRN
  Administered 2013-05-22: 12:00:00

## 2013-05-22 MED ORDER — FLEET ENEMA 7-19 GM/118ML RE ENEM: 1.0000 | ENEMA | Freq: Once | RECTAL | Status: AC | PRN

## 2013-05-22 MED ORDER — MORPHINE SULFATE (PF) 1 MG/ML IV SOLN
INTRAVENOUS | Status: AC
  Filled 2013-05-22: qty 25

## 2013-05-22 MED ORDER — SODIUM CHLORIDE 0.9 % IV SOLN
10.0000 mg | INTRAVENOUS | Status: DC | PRN
  Administered 2013-05-22: 25 ug/min via INTRAVENOUS

## 2013-05-22 MED ORDER — DIPHENHYDRAMINE HCL 50 MG/ML IJ SOLN: 12.5000 mg | Freq: Four times a day (QID) | INTRAMUSCULAR | Status: DC | PRN

## 2013-05-22 MED ORDER — DIAZEPAM 5 MG PO TABS
5.0000 mg | ORAL_TABLET | Freq: Four times a day (QID) | ORAL | Status: DC | PRN
  Administered 2013-05-23: 5 mg via ORAL
  Filled 2013-05-22: qty 1

## 2013-05-22 MED ORDER — PROPOFOL 10 MG/ML IV BOLUS
INTRAVENOUS | Status: DC | PRN
  Administered 2013-05-22: 150 mg via INTRAVENOUS

## 2013-05-22 MED ORDER — HYDROCHLOROTHIAZIDE 12.5 MG PO CAPS
12.5000 mg | ORAL_CAPSULE | Freq: Every day | ORAL | Status: DC
  Administered 2013-05-23 – 2013-05-25 (×3): 12.5 mg via ORAL
  Filled 2013-05-22 (×5): qty 1

## 2013-05-22 MED ORDER — SODIUM CHLORIDE 0.9 % IJ SOLN: 3.0000 mL | Freq: Two times a day (BID) | INTRAMUSCULAR | Status: DC

## 2013-05-22 MED ORDER — SODIUM CHLORIDE 0.9 % IJ SOLN: 3.0000 mL | INTRAMUSCULAR | Status: DC | PRN

## 2013-05-22 MED ORDER — LACTATED RINGERS IV SOLN: INTRAVENOUS | Status: DC

## 2013-05-22 MED ORDER — POVIDONE-IODINE 7.5 % EX SOLN
Freq: Once | CUTANEOUS | Status: DC
  Filled 2013-05-22: qty 118

## 2013-05-22 MED ORDER — LIDOCAINE HCL (CARDIAC) 20 MG/ML IV SOLN
INTRAVENOUS | Status: DC | PRN
  Administered 2013-05-22: 20 mg via INTRAVENOUS

## 2013-05-22 MED ORDER — ONDANSETRON HCL 4 MG/2ML IJ SOLN: 4.0000 mg | INTRAMUSCULAR | Status: DC | PRN

## 2013-05-22 MED ORDER — ROCURONIUM BROMIDE 100 MG/10ML IV SOLN
INTRAVENOUS | Status: DC | PRN
  Administered 2013-05-22: 40 mg via INTRAVENOUS
  Administered 2013-05-22: 10 mg via INTRAVENOUS

## 2013-05-22 MED ORDER — SENNOSIDES-DOCUSATE SODIUM 8.6-50 MG PO TABS: 1.0000 | ORAL_TABLET | Freq: Every evening | ORAL | Status: DC | PRN

## 2013-05-22 MED ORDER — CLINDAMYCIN PHOSPHATE 600 MG/50ML IV SOLN
600.0000 mg | Freq: Once | INTRAVENOUS | Status: AC
  Administered 2013-05-22: 600 mg via INTRAVENOUS
  Filled 2013-05-22 (×2): qty 50

## 2013-05-22 MED ORDER — ONDANSETRON HCL 4 MG/2ML IJ SOLN
INTRAMUSCULAR | Status: DC | PRN
  Administered 2013-05-22: 4 mg via INTRAVENOUS

## 2013-05-22 MED ORDER — DOCUSATE SODIUM 100 MG PO CAPS
100.0000 mg | ORAL_CAPSULE | Freq: Two times a day (BID) | ORAL | Status: DC
  Administered 2013-05-22 – 2013-05-25 (×6): 100 mg via ORAL
  Filled 2013-05-22 (×7): qty 1

## 2013-05-22 MED ORDER — GLYCOPYRROLATE 0.2 MG/ML IJ SOLN
INTRAMUSCULAR | Status: DC | PRN
  Administered 2013-05-22: 0.4 mg via INTRAVENOUS

## 2013-05-22 MED ORDER — METHYLENE BLUE 1 % INJ SOLN
INTRAMUSCULAR | Status: AC
  Filled 2013-05-22: qty 10

## 2013-05-22 MED ORDER — HYDROMORPHONE HCL 2 MG PO TABS
1.0000 mg | ORAL_TABLET | ORAL | Status: DC | PRN
  Filled 2013-05-22: qty 1

## 2013-05-22 MED ORDER — MORPHINE SULFATE (PF) 1 MG/ML IV SOLN
INTRAVENOUS | Status: DC
  Administered 2013-05-22 – 2013-05-23 (×2): 4.5 mg via INTRAVENOUS

## 2013-05-22 MED ORDER — PHENYLEPHRINE HCL 10 MG/ML IJ SOLN
INTRAMUSCULAR | Status: DC | PRN
  Administered 2013-05-22: 80 ug via INTRAVENOUS

## 2013-05-22 MED ORDER — ACETAMINOPHEN 650 MG RE SUPP: 650.0000 mg | RECTAL | Status: DC | PRN

## 2013-05-22 MED ORDER — SODIUM CHLORIDE 0.9 % IV SOLN: 250.0000 mL | INTRAVENOUS | Status: DC

## 2013-05-22 MED ORDER — 0.9 % SODIUM CHLORIDE (POUR BTL) OPTIME
TOPICAL | Status: DC | PRN
  Administered 2013-05-22: 1000 mL

## 2013-05-22 MED ORDER — MUPIROCIN 2 % EX OINT
TOPICAL_OINTMENT | Freq: Two times a day (BID) | CUTANEOUS | Status: DC
  Administered 2013-05-22: 21:00:00 via NASAL
  Administered 2013-05-22: 1 via NASAL
  Administered 2013-05-23 – 2013-05-25 (×4): via NASAL
  Filled 2013-05-22: qty 22

## 2013-05-22 MED ORDER — DIPHENHYDRAMINE HCL 12.5 MG/5ML PO ELIX: 12.5000 mg | ORAL_SOLUTION | Freq: Four times a day (QID) | ORAL | Status: DC | PRN

## 2013-05-22 MED ORDER — FENTANYL CITRATE 0.05 MG/ML IJ SOLN
INTRAMUSCULAR | Status: AC
  Filled 2013-05-22: qty 2

## 2013-05-22 MED ORDER — HYDROMORPHONE HCL PF 1 MG/ML IJ SOLN
0.2500 mg | INTRAMUSCULAR | Status: DC | PRN
  Administered 2013-05-22 (×2): 0.25 mg via INTRAVENOUS

## 2013-05-22 SURGICAL SUPPLY — 76 items
BENZOIN TINCTURE PRP APPL 2/3 (GAUZE/BANDAGES/DRESSINGS) ×2 IMPLANT
BLADE SURG ROTATE 9660 (MISCELLANEOUS) IMPLANT
BUR ROUND PRECISION 4.0 (BURR) ×2 IMPLANT
CAGE CONCORDE BULLET 9X11X27 (Cage) ×1 IMPLANT
CAGE SPNL PRLL BLT NOSE 27X9 (Cage) ×1 IMPLANT
CARTRIDGE OIL MAESTRO DRILL (MISCELLANEOUS) ×2 IMPLANT
CLOTH BEACON ORANGE TIMEOUT ST (SAFETY) ×2 IMPLANT
CLSR STERI-STRIP ANTIMIC 1/2X4 (GAUZE/BANDAGES/DRESSINGS) ×2 IMPLANT
CONT SPEC STER OR (MISCELLANEOUS) ×2 IMPLANT
CORDS BIPOLAR (ELECTRODE) ×2 IMPLANT
COVER MAYO STAND STRL (DRAPES) ×2 IMPLANT
COVER SURGICAL LIGHT HANDLE (MISCELLANEOUS) ×2 IMPLANT
DIFFUSER DRILL AIR PNEUMATIC (MISCELLANEOUS) ×4 IMPLANT
DRAIN CHANNEL 15F RND FF W/TCR (WOUND CARE) IMPLANT
DRAPE C-ARM 42X72 X-RAY (DRAPES) ×2 IMPLANT
DRAPE ORTHO SPLIT 77X108 STRL (DRAPES) ×1
DRAPE POUCH INSTRU U-SHP 10X18 (DRAPES) ×2 IMPLANT
DRAPE SURG 17X23 STRL (DRAPES) ×6 IMPLANT
DRAPE SURG ORHT 6 SPLT 77X108 (DRAPES) ×1 IMPLANT
DURAPREP 26ML APPLICATOR (WOUND CARE) ×2 IMPLANT
ELECT BLADE 4.0 EZ CLEAN MEGAD (MISCELLANEOUS) ×2
ELECT CAUTERY BLADE 6.4 (BLADE) ×2 IMPLANT
ELECT REM PT RETURN 9FT ADLT (ELECTROSURGICAL) ×2
ELECTRODE BLDE 4.0 EZ CLN MEGD (MISCELLANEOUS) ×1 IMPLANT
ELECTRODE REM PT RTRN 9FT ADLT (ELECTROSURGICAL) ×1 IMPLANT
EVACUATOR SILICONE 100CC (DRAIN) IMPLANT
GAUZE SPONGE 4X4 16PLY XRAY LF (GAUZE/BANDAGES/DRESSINGS) ×8 IMPLANT
GLOVE BIO SURGEON STRL SZ7 (GLOVE) ×2 IMPLANT
GLOVE BIO SURGEON STRL SZ8 (GLOVE) ×2 IMPLANT
GLOVE BIOGEL PI IND STRL 7.5 (GLOVE) ×1 IMPLANT
GLOVE BIOGEL PI IND STRL 8 (GLOVE) ×1 IMPLANT
GLOVE BIOGEL PI INDICATOR 7.5 (GLOVE) ×1
GLOVE BIOGEL PI INDICATOR 8 (GLOVE) ×1
GOWN STRL NON-REIN LRG LVL3 (GOWN DISPOSABLE) ×4 IMPLANT
GOWN STRL REIN XL XLG (GOWN DISPOSABLE) ×2 IMPLANT
IV CATH 14GX2 1/4 (CATHETERS) ×2 IMPLANT
KIT BASIN OR (CUSTOM PROCEDURE TRAY) ×2 IMPLANT
KIT POSITION SURG JACKSON T1 (MISCELLANEOUS) ×2 IMPLANT
KIT ROOM TURNOVER OR (KITS) ×2 IMPLANT
MARKER SKIN DUAL TIP RULER LAB (MISCELLANEOUS) ×2 IMPLANT
MIX DBX 10CC 35% BONE (Bone Implant) ×2 IMPLANT
NEEDLE 22X1 1/2 (OR ONLY) (NEEDLE) ×2 IMPLANT
NEEDLE BONE MARROW 8GX6 FENEST (NEEDLE) IMPLANT
NEEDLE HYPO 25GX1X1/2 BEV (NEEDLE) ×2 IMPLANT
NEEDLE SPNL 18GX3.5 QUINCKE PK (NEEDLE) ×4 IMPLANT
NEURO MONITORING STIM (LABOR (TRAVEL & OVERTIME)) ×2 IMPLANT
NS IRRIG 1000ML POUR BTL (IV SOLUTION) ×2 IMPLANT
OIL CARTRIDGE MAESTRO DRILL (MISCELLANEOUS) ×4
PACK LAMINECTOMY ORTHO (CUSTOM PROCEDURE TRAY) ×2 IMPLANT
PACK UNIVERSAL I (CUSTOM PROCEDURE TRAY) ×2 IMPLANT
PAD ARMBOARD 7.5X6 YLW CONV (MISCELLANEOUS) ×4 IMPLANT
PATTIES SURGICAL .5 X1 (DISPOSABLE) ×2 IMPLANT
PATTIES SURGICAL .5X1.5 (GAUZE/BANDAGES/DRESSINGS) ×2 IMPLANT
ROD PRE BENT EXP 40MM (Rod) ×2 IMPLANT
SCREW EXPEDIUM POLY 5X40 (Screw) ×4 IMPLANT
SPONGE GAUZE 4X4 12PLY (GAUZE/BANDAGES/DRESSINGS) ×2 IMPLANT
SPONGE INTESTINAL PEANUT (DISPOSABLE) ×2 IMPLANT
SPONGE SURGIFOAM ABS GEL 100 (HEMOSTASIS) ×2 IMPLANT
STRIP CLOSURE SKIN 1/2X4 (GAUZE/BANDAGES/DRESSINGS) ×4 IMPLANT
SURGIFLO TRUKIT (HEMOSTASIS) IMPLANT
SUT MNCRL AB 4-0 PS2 18 (SUTURE) ×4 IMPLANT
SUT VIC AB 0 CT1 18XCR BRD 8 (SUTURE) ×1 IMPLANT
SUT VIC AB 0 CT1 8-18 (SUTURE) ×1
SUT VIC AB 1 CT1 18XCR BRD 8 (SUTURE) ×2 IMPLANT
SUT VIC AB 1 CT1 8-18 (SUTURE) ×2
SUT VIC AB 2-0 CT2 18 VCP726D (SUTURE) ×2 IMPLANT
SYR 20CC LL (SYRINGE) ×2 IMPLANT
SYR BULB IRRIGATION 50ML (SYRINGE) ×2 IMPLANT
SYR CONTROL 10ML LL (SYRINGE) ×4 IMPLANT
SYR TB 1ML LUER SLIP (SYRINGE) ×4 IMPLANT
TAPE CLOTH SURG 4X10 WHT LF (GAUZE/BANDAGES/DRESSINGS) ×2 IMPLANT
TOWEL OR 17X24 6PK STRL BLUE (TOWEL DISPOSABLE) ×2 IMPLANT
TOWEL OR 17X26 10 PK STRL BLUE (TOWEL DISPOSABLE) ×2 IMPLANT
TRAY FOLEY CATH 16FRSI W/METER (SET/KITS/TRAYS/PACK) ×2 IMPLANT
WATER STERILE IRR 1000ML POUR (IV SOLUTION) ×2 IMPLANT
YANKAUER SUCT BULB TIP NO VENT (SUCTIONS) ×2 IMPLANT

## 2013-05-22 NOTE — Anesthesia Postprocedure Evaluation (Signed)
Anesthesia Post Note  Patient: Crystal Herrera  Procedure(s) Performed: Procedure(s) (LRB): TRANSFORAMINAL LUMBAR INTERBODY FUSION (TLIF) WITH PEDICLE SCREW FIXATION 1 LEVEL (Left)  Anesthesia type: general  Patient location: PACU  Post pain: Pain level controlled  Post assessment: Patient's Cardiovascular Status Stable  Last Vitals:  Filed Vitals:   05/22/13 1600  BP: 144/63  Pulse: 68  Temp:   Resp: 13    Post vital signs: Reviewed and stable  Level of consciousness: sedated  Complications: No apparent anesthesia complications

## 2013-05-22 NOTE — Anesthesia Procedure Notes (Signed)
Procedure Name: Intubation Date/Time: 05/22/2013 11:35 AM Performed by: Brien Mates D Pre-anesthesia Checklist: Patient identified, Emergency Drugs available, Suction available, Patient being monitored and Timeout performed Patient Re-evaluated:Patient Re-evaluated prior to inductionOxygen Delivery Method: Circle system utilized Preoxygenation: Pre-oxygenation with 100% oxygen Intubation Type: IV induction Ventilation: Mask ventilation without difficulty Laryngoscope Size: Miller and 2 Grade View: Grade I Tube type: Oral Tube size: 7.5 mm Number of attempts: 1 Airway Equipment and Method: Stylet Placement Confirmation: ETT inserted through vocal cords under direct vision,  positive ETCO2 and breath sounds checked- equal and bilateral Secured at: 21 cm Tube secured with: Tape Dental Injury: Teeth and Oropharynx as per pre-operative assessment

## 2013-05-22 NOTE — ED Provider Notes (Signed)
CSN: [id number]     Arrival date & time 05/21/13  2052 History   First MD Initiated Contact with Patient 05/21/13 2126     Chief Complaint  Patient presents with  . Nausea  . Emesis    The history is provided by the patient. No language interpreter was used.  Patient is a 72 y.o. Caucasian female with past medical history of GERD and hypertension who comes emergency department today with vomiting and chills.  This morning she had breakfast at home and then went to get a flu vaccine. After she returned home she began having profuse vomiting. She vomited approximately every 20-30 minutes for several episodes. She then developed shaking episodes with chills. Result she came to the emergency department. She had no weakness, no numbness, did not hit her head, no abdominal pain. She's had no fevers at home. She denies dysuria, frequency, urgency, diarrhea, or constipation.  Past Medical History  Diagnosis Date  . HYPERLIPIDEMIA 02/09/2010  . HYPERTENSION 02/09/2010  . GERD 02/09/2010  . SKIN LESION 03/22/2010  . OCCIPITAL NEURALGIA 06/09/2010  . BACK PAIN, CHRONIC 02/09/2010  . OSTEOPENIA 02/09/2010  . FATIGUE 02/09/2010  . Diarrhea 04/26/2010  . COLONIC POLYPS, HX OF 02/09/2010  . Lumbar disc disease   . Complication of anesthesia   . PONV (postoperative nausea and vomiting)   . DEPRESSION 02/09/2010    pt. takes Cymbalta for pain issues   . COMMON MIGRAINE 02/09/2010    neuritis in R side of neck & head  . Neuromuscular disorder     neuritis in head & neck  . DEGENERATIVE JOINT DISEASE 02/09/2010    hands , lumbar stenosis   . Varicose vein of leg     BLE  . Pneumonia     "as an infant" (05/15/2012)  . Anemia 1948?  Marland Kitchen Allergic rhinitis, cause unspecified 01/29/2013   Past Surgical History  Procedure Laterality Date  . Chymopapain injection  1986    Lumbar  . Lumbar laminectomy  1986  . Ovarian cyst removal  2006  . Hernia repair  05/15/2012    ventral incisional   . Cholecystectomy  ?  2009  . Varicose vein surgery  ~ 2008    bilaterally  . Breast biopsy  1988    left; Neg  . Tubal ligation  1970's  . Dilation and curettage of uterus  1960's  . Ventral hernia repair  05/15/2012    Procedure: LAPAROSCOPIC VENTRAL HERNIA;  Surgeon: Atilano Ina, MD,FACS;  Location: [facility] OR;  Service: General;  Laterality: N/A;  laparoscopic ventral incisional hernia repair with mesh  . Colonoscopy     Family History  Problem Relation Age of Onset  . Lung cancer Mother   . Diabetes Father   . Breast cancer Maternal Grandmother   . Cancer Maternal Grandmother     breast  . Colon cancer Maternal Aunt    History  Substance Use Topics  . Smoking status: Former Smoker -- 1.00 packs/day for 10 years    Types: Cigarettes    Quit date: 05/11/1971  . Smokeless tobacco: Never Used  . Alcohol Use: 1.8 oz/week    3 Glasses of wine per week     Comment: 05/15/2012 "wine 2-4 glasses/wk"   OB History   Grav Para Term Preterm Abortions TAB SAB Ect Mult Living                 Review of Systems  Constitutional: Positive for chills. Negative for fever.  Respiratory: Negative for cough and shortness of breath.   Gastrointestinal: Positive for nausea and vomiting. Negative for abdominal pain, diarrhea and constipation.  Genitourinary: Negative for dysuria, urgency and frequency.  Neurological: Positive for tremors. Negative for dizziness, weakness and headaches.  All other systems reviewed and are negative.    Allergies  Cefuroxime; Cephalosporins; Citalopram; Escitalopram; Penicillins; Sulfonamide derivatives; and Vancomycin  Home Medications   Current Outpatient Rx  Name  Route  Sig  Dispense  Refill  . acetaminophen (TYLENOL) 500 MG tablet   Oral   Take 1,000 mg by mouth every 6 (six) hours as needed for pain.         . calcium gluconate 500 MG tablet   Oral   Take 500 mg by mouth 2 (two) times daily.           . carisoprodol (SOMA) 350 MG tablet   Oral   Take 350 mg by  mouth 2 (two) times daily as needed for muscle spasms.         . diclofenac sodium (VOLTAREN) 1 % GEL   Topical   Apply 1 application topically 2 (two) times daily as needed (for hand pain).         . Evening Primrose Oil 1000 MG CAPS   Oral   Take 1,000 mg by mouth daily.         . hydrochlorothiazide (MICROZIDE) 12.5 MG capsule   Oral   Take 1 capsule (12.5 mg total) by mouth daily after breakfast.   90 capsule   3   . simvastatin (ZOCOR) 20 MG tablet      Take 1 tablet by mouth at  bedtime   90 tablet   1    BP 128/56  Pulse 83  Temp(Src) 100.4 F (38 C) (Rectal)  Resp 16  SpO2 98% Physical Exam  Nursing note and vitals reviewed. Constitutional: She is oriented to person, place, and time. She appears well-developed and well-nourished. No distress.  Generalized whole body shaking.  Responsive during shaking episodes.  Follows commands with all extremities.  Shaking resolves while following commands.    HENT:  Head: Normocephalic and atraumatic.  Eyes: Pupils are equal, round, and reactive to light.  Neck: Normal range of motion.  Cardiovascular: Normal rate, regular rhythm, normal heart sounds and intact distal pulses.   Pulmonary/Chest: Effort normal. No respiratory distress. She has no wheezes. She exhibits no tenderness.  Abdominal: Soft. Bowel sounds are normal. She exhibits no distension. There is no tenderness. There is no rigidity, no rebound, no guarding and no CVA tenderness.  Neurological: She is alert and oriented to person, place, and time. She has normal strength. No cranial nerve deficit or sensory deficit. She exhibits normal muscle tone. Coordination and gait normal.  Skin: Skin is warm and dry.    ED Course  Procedures  Labs Review Labs Reviewed  CBC WITH DIFFERENTIAL - Abnormal; Notable for the following:    Neutrophils Relative % 87 (*)    Neutro Abs 8.8 (*)    Lymphocytes Relative 8 (*)    All other components within normal limits   COMPREHENSIVE METABOLIC PANEL - Abnormal; Notable for the following:    Potassium 3.4 (*)    Glucose, Bld 136 (*)    GFR calc non Af Amer 82 (*)    All other components within normal limits  URINALYSIS, ROUTINE W REFLEX MICROSCOPIC - Abnormal; Notable for the following:    Hgb urine dipstick TRACE (*)  Ketones, ur 15 (*)    Leukocytes, UA SMALL (*)    All other components within normal limits  LIPASE, BLOOD  URINE MICROSCOPIC-ADD ON  CG4 I-STAT (LACTIC ACID)   Imaging Review No results found.  EKG Interpretation   None       MDM  Ms. Schmeling is a 72 year old Caucasian female who comes emergency department today with vomiting and chills. Physical exam as above. With no abdominal pain and benign abdominal exam doubt acute cholecystitis, pancreatitis, or appendicitis. Initial workup included a UA, lactic acid, CBC, CMP, lipase.  With no abdominal pain and benign exam she was not felt to require abdominal imaging at this time. She was treated with Zofran and a liter of fluids. Lipase was 50. CMP potassium 3.4 otherwise unremarkable. CBC was unremarkable. I-STAT lactic acid was 1.18. UA demonstrated small leukocytes and trace blood. Result doubt urinary tract infection. Ms. Havel was observed in the emergency department for approximately 3 hours. Her symptoms completely resolved. Unremarkable labs a benign exam she was felt to be stable for discharge. Ms. Helminiak with was instructed to followup with her primary care physician in a week for her shaking. She was instructed to return to the emergency department if she develops abdominal pain, fevers, inability to tolerate by mouth, or new concerns. Labs reviewed by myself and considered and medical decision-making. Care was discussed with my attending Dr. Denton Lank.    1. Vomiting   2. Episode of shaking       Bethann Berkshire, MD 05/22/13 [phone]

## 2013-05-22 NOTE — H&P (Signed)
PREOPERATIVE H&P  Chief Complaint: left leg pain  HPI: Crystal Herrera is a 72 y.o. female who presents with ongoing pain in the left leg in the distribution of the L2 nerve. Patient has failed multiple ESIs and other conservative care. MRI = forminal HNP on  Left at L2/3.  Past Medical History  Diagnosis Date  . HYPERLIPIDEMIA 02/09/2010  . HYPERTENSION 02/09/2010  . GERD 02/09/2010  . SKIN LESION 03/22/2010  . OCCIPITAL NEURALGIA 06/09/2010  . BACK PAIN, CHRONIC 02/09/2010  . OSTEOPENIA 02/09/2010  . FATIGUE 02/09/2010  . Diarrhea 04/26/2010  . COLONIC POLYPS, HX OF 02/09/2010  . Lumbar disc disease   . Complication of anesthesia   . PONV (postoperative nausea and vomiting)   . DEPRESSION 02/09/2010    pt. takes Cymbalta for pain issues   . COMMON MIGRAINE 02/09/2010    neuritis in R side of neck & head  . Neuromuscular disorder     neuritis in head & neck  . DEGENERATIVE JOINT DISEASE 02/09/2010    hands , lumbar stenosis   . Varicose vein of leg     BLE  . Pneumonia     "as an infant" (05/15/2012)  . Anemia 1948?  Marland Kitchen Allergic rhinitis, cause unspecified 01/29/2013   Past Surgical History  Procedure Laterality Date  . Chymopapain injection  1986    Lumbar  . Lumbar laminectomy  1986  . Ovarian cyst removal  2006  . Hernia repair  05/15/2012    ventral incisional   . Cholecystectomy  ? 2009  . Varicose vein surgery  ~ 2008    bilaterally  . Breast biopsy  1988    left; Neg  . Tubal ligation  1970's  . Dilation and curettage of uterus  1960's  . Ventral hernia repair  05/15/2012    Procedure: LAPAROSCOPIC VENTRAL HERNIA;  Surgeon: Atilano Ina, MD,FACS;  Location: [facility] OR;  Service: General;  Laterality: N/A;  laparoscopic ventral incisional hernia repair with mesh  . Colonoscopy     History   Social History  . Marital Status: Married    Spouse Name: N/A    Number of Children: N/A  . Years of Education: N/A   Social History Main Topics  . Smoking status: Former  Smoker -- 1.00 packs/day for 10 years    Types: Cigarettes    Quit date: 05/11/1971  . Smokeless tobacco: Never Used  . Alcohol Use: 1.8 oz/week    3 Glasses of wine per week     Comment: 05/15/2012 "wine 2-4 glasses/wk"  . Drug Use: No  . Sexual Activity: Not Currently   Other Topics Concern  . None   Social History Narrative   Coffee daily    Family History  Problem Relation Age of Onset  . Lung cancer Mother   . Diabetes Father   . Breast cancer Maternal Grandmother   . Cancer Maternal Grandmother     breast  . Colon cancer Maternal Aunt    Allergies  Allergen Reactions  . Cefuroxime Other (See Comments)    Unknown    . Cephalosporins Other (See Comments)    Unknown   . Citalopram Nausea Only  . Escitalopram Nausea Only  . Penicillins Hives  . Sulfonamide Derivatives Hives  . Vancomycin Rash    rash   Prior to Admission medications   Medication Sig Start Date End Date Taking? Authorizing Provider  acetaminophen (TYLENOL) 500 MG tablet Take 1,000 mg by mouth every 6 (six) hours  as needed for pain.   Yes Historical Provider, MD  calcium gluconate 500 MG tablet Take 500 mg by mouth 2 (two) times daily.     Yes Historical Provider, MD  carisoprodol (SOMA) 350 MG tablet Take 350 mg by mouth 2 (two) times daily as needed for muscle spasms.   Yes Historical Provider, MD  diclofenac sodium (VOLTAREN) 1 % GEL Apply 1 application topically 2 (two) times daily as needed (for hand pain).   Yes Historical Provider, MD  hydrochlorothiazide (MICROZIDE) 12.5 MG capsule Take 1 capsule (12.5 mg total) by mouth daily after breakfast. 07/30/12 07/30/13 Yes Corwin Levins, MD  simvastatin (ZOCOR) 20 MG tablet Take 1 tablet by mouth at  bedtime 11/12/12  Yes Corwin Levins, MD  Evening Primrose Oil 1000 MG CAPS Take 1,000 mg by mouth daily.    Historical Provider, MD     All other systems have been reviewed and were otherwise negative with the exception of those mentioned in the HPI and as  above.  Physical Exam: There were no vitals filed for this visit.  General: Alert, no acute distress Cardiovascular: No pedal edema Respiratory: No cyanosis, no use of accessory musculature GI: No organomegaly, abdomen is soft and non-tender Skin: No lesions in the area of chief complaint Neurologic: Sensation intact distally Psychiatric: Patient is competent for consent with normal mood and affect Lymphatic: No axillary or cervical lymphadenopathy   Assessment/Plan: Left leg pain Plan for Procedure(s): TRANSFORAMINAL LUMBAR INTERBODY FUSION (TLIF) WITH PEDICLE SCREW FIXATION L2/3   Emilee Hero, MD 05/22/2013 8:04 AM

## 2013-05-22 NOTE — OR Nursing (Signed)
Pt back from sx awakening w/ exclamations that she has not had sx and has no recall of consenting for sx. Have brought husband to bedside to reinforce reality.  Husband offered emotional support and reality check. Pt remains without connection to reality of sx.  Dr. Yevette Edwards informed of pt's mental state and lack of any accompanying neuro symptoms to suggest neurological event. Will continue to monitor closely.

## 2013-05-22 NOTE — Progress Notes (Signed)
Elijah Birk, RN in [place] with Dr. Yevette Edwards to report pt's  Allergies. Antibiotic changed to clindamycin 600mg  IV.

## 2013-05-22 NOTE — Transfer of Care (Signed)
Immediate Anesthesia Transfer of Care Note  Patient: Crystal Herrera  Procedure(s) Performed: Procedure(s) with comments: TRANSFORAMINAL LUMBAR INTERBODY FUSION (TLIF) WITH PEDICLE SCREW FIXATION 1 LEVEL (Left) - Left sided lumbar 2-3 Transforaminal lumbar interbody fusion with instrumentation, allograft  Patient Location: PACU  Anesthesia Type:General  Level of Consciousness: sedated  Airway & Oxygen Therapy: Patient Spontanous Breathing and Patient connected to face mask oxygen  Post-op Assessment: Report given to PACU RN and Post -op Vital signs reviewed and stable  Post vital signs: Reviewed and stable  Complications: No apparent anesthesia complications

## 2013-05-22 NOTE — Preoperative (Signed)
Beta Blockers   Reason not to administer Beta Blockers:Not Applicable 

## 2013-05-22 NOTE — Anesthesia Preprocedure Evaluation (Addendum)
Anesthesia Evaluation  Patient identified by MRN, date of birth, ID band Patient awake    Reviewed: Allergy & Precautions, H&P , NPO status , Patient's Chart, lab work & pertinent test results  History of Anesthesia Complications (+) PONV and history of anesthetic complications  Airway Mallampati: II TM Distance: >3 FB Neck ROM: Full    Dental  (+) Teeth Intact and Dental Advisory Given   Pulmonary pneumonia -,    Pulmonary exam normal       Cardiovascular hypertension, Pt. on home beta blockers + Peripheral Vascular Disease     Neuro/Psych  Headaches, Depression    GI/Hepatic Neg liver ROS, GERD-  Medicated,  Endo/Other  negative endocrine ROS  Renal/GU negative Renal ROS     Musculoskeletal   Abdominal   Peds  Hematology   Anesthesia Other Findings   Reproductive/Obstetrics                          Anesthesia Physical Anesthesia Plan  ASA: III  Anesthesia Plan: General   Post-op Pain Management:    Induction: Intravenous  Airway Management Planned: Oral ETT  Additional Equipment:   Intra-op Plan:   Post-operative Plan: Extubation in OR  Informed Consent: I have reviewed the patients History and Physical, chart, labs and discussed the procedure including the risks, benefits and alternatives for the proposed anesthesia with the patient or authorized representative who has indicated his/her understanding and acceptance.   Dental advisory given  Plan Discussed with: CRNA, Anesthesiologist and Surgeon  Anesthesia Plan Comments:        Anesthesia Quick Evaluation

## 2013-05-23 ENCOUNTER — Encounter (HOSPITAL_COMMUNITY): Payer: Self-pay | Admitting: General Practice

## 2013-05-23 MED ORDER — OXYCODONE HCL 5 MG PO TABS
10.0000 mg | ORAL_TABLET | ORAL | Status: DC | PRN
  Administered 2013-05-23: 10 mg via ORAL
  Administered 2013-05-23 (×4): 15 mg via ORAL
  Administered 2013-05-25: 10 mg via ORAL
  Filled 2013-05-23: qty 3
  Filled 2013-05-23: qty 2
  Filled 2013-05-23 (×2): qty 3
  Filled 2013-05-23: qty 2
  Filled 2013-05-23: qty 3

## 2013-05-23 MED ORDER — ONDANSETRON HCL 4 MG/2ML IJ SOLN
4.0000 mg | Freq: Four times a day (QID) | INTRAMUSCULAR | Status: DC | PRN
  Administered 2013-05-23: 4 mg via INTRAVENOUS
  Filled 2013-05-23: qty 2

## 2013-05-23 NOTE — Evaluation (Signed)
Physical Therapy Evaluation Patient Details Name: Crystal Herrera MRN: [id number] DOB: Sep 14, 1940 Today's Date: 05/23/2013 Time: 4098-1191 PT Time Calculation (min): 18 min  PT Assessment / Plan / Recommendation History of Present Illness  Crystal Herrera is a 72 y.o. female who presents with ongoing pain in the left leg in the distribution of the L2 nerve. Patient has failed multiple ESIs and other conservative care. MRI = forminal HNP on  Left at L2/3.  Clinical Impression  Pt initially did not want to participate with therapy but with encouragement she did great. Currently transferring and ambulating short distances at min assist level. Pt will need to practice stairs without railing prior to D/C home. Will continue to follow acutely.     PT Assessment  Patient needs continued PT services    Follow Up Recommendations  Home health PT;Outpatient PT;Supervision/Assistance - 24 hour (pt taking time to decide HH vs. OP)       Barriers to Discharge   stairs to enter witout railing, need to practice    Equipment Recommendations  Rolling walker with 5" wheels       Frequency Min 6X/week    Precautions / Restrictions Precautions Precautions: Back Precaution Booklet Issued: Yes (comment) Precaution Comments: Pt and her husband educated on back precautons. Pt provded with handout Restrictions Weight Bearing Restrictions: No   Pertinent Vitals/Pain 8/10 low back, RN premedicated pt      Mobility  Bed Mobility Bed Mobility: Supine to Sit;Rolling Left;Left Sidelying to Sit;Sitting - Scoot to Edge of Bed;Sit to Supine;Sit to Sidelying Left;Scooting to [facility] Rolling Left: 4: Min assist Left Sidelying to Sit: 4: Min assist Supine to Sit: 4: Min assist;With rails;HOB flat Sitting - Scoot to Edge of Bed: 4: Min guard Sit to Supine: 4: Min assist Sit to Sidelying Left: 4: Min assist Scooting to [facility]: 4: Min assist Details for Bed Mobility Assistance: Verbal cues for log roll,  tactile cue for bil. LE sequencing and positioning.  Transfers Transfers: Sit to Stand;Stand to Sit Sit to Stand: 4: Min assist;From bed;From chair/3-in-1 Stand to Sit: 4: Min assist;To bed;To chair/3-in-1 Details for Transfer Assistance: Cues for UE placement (pt pulling on RW), and for precautions.  Ambulation/Gait Ambulation/Gait Assistance: 4: Min assist Ambulation Distance (Feet): 30 Feet Assistive device: Rolling walker Ambulation/Gait Assistance Details: Cues for sequencing and safety with RW.  Gait Pattern: Step-through pattern (min foot clearance) Gait velocity: decreased        PT Diagnosis: Difficulty walking;Abnormality of gait;Generalized weakness;Acute pain  PT Problem List: Decreased strength;Decreased activity tolerance;Decreased balance;Decreased mobility;Decreased knowledge of precautions;Decreased knowledge of use of DME;Pain PT Treatment Interventions: DME instruction;Gait training;Stair training;Functional mobility training;Therapeutic activities;Therapeutic exercise;Balance training;Neuromuscular re-education;Patient/family education     PT Goals(Current goals can be found in the care plan section) Acute Rehab PT Goals Patient Stated Goal: To go home PT Goal Formulation: With patient Time For Goal Achievement: 05/30/13 Potential to Achieve Goals: Good  Visit Information  Last PT Received On: 05/23/13 Assistance Needed: +1 History of Present Illness: Crystal Herrera is a 72 y.o. female who presents with ongoing pain in the left leg in the distribution of the L2 nerve. Patient has failed multiple ESIs and other conservative care. MRI = forminal HNP on  Left at L2/3.       Prior Functioning  Home Living Family/patient expects to be discharged to:: Private residence Living Arrangements: Spouse/significant other Available Help at Discharge: Family;Available 24 hours/day Type of Home: House Home Access: Stairs to enter Entrance  Stairs-Number of Steps:  2 Entrance Stairs-Rails: None Home Layout: One level Home Equipment: Crutches Prior Function Level of Independence: Independent Communication Communication: No difficulties Dominant Hand: Right    Cognition  Cognition Arousal/Alertness: Awake/alert Behavior During Therapy: WFL for tasks assessed/performed Overall Cognitive Status: Within Functional Limits for tasks assessed    Extremity/Trunk Assessment Upper Extremity Assessment Upper Extremity Assessment: Defer to OT evaluation Lower Extremity Assessment Lower Extremity Assessment: Generalized weakness (grossly >/= 4-/5) Cervical / Trunk Assessment Cervical / Trunk Assessment: Normal   Balance Balance Balance Assessed: Yes Dynamic Sitting Balance Dynamic Sitting - Balance Support: Feet supported;During functional activity;No upper extremity supported Dynamic Sitting - Level of Assistance: 5: Stand by assistance Dynamic Standing Balance Dynamic Standing - Balance Support: Left upper extremity supported;During functional activity Dynamic Standing - Level of Assistance: 4: Min assist  End of Session PT - End of Session Activity Tolerance: Patient tolerated treatment well Patient left: in chair;with call bell/phone within reach;with family/visitor present  GP     Wilhemina Bonito 05/23/2013, 3:16 PM

## 2013-05-23 NOTE — Op Note (Signed)
Crystal Herrera, Crystal Herrera            ACCOUNT NO.:  [id number]  MEDICAL RECORD NO.:  [id number]  LOCATION:  5N10C                        FACILITY:  [facility]  PHYSICIAN:  Estill Bamberg, MD      DATE OF BIRTH:  February 13, 1941  DATE OF PROCEDURE:  05/22/2013                              OPERATIVE REPORT   PREOPERATIVE DIAGNOSES: 1. Left-sided L3 radiculopathy. 2. Left-sided L2-3 foraminal/extraforaminal disk herniation causing     compression of the exiting L2 nerve.  POSTOPERATIVE DIAGNOSES: 1. Left-sided L3 radiculopathy. 2. Left-sided L2-3 foraminal/extraforaminal disk herniation causing     compression of the exiting L2 nerve.  PROCEDURE: 1. Left-sided L2-3 transforaminal lumbar interbody fusion. 2. Right-sided L2-3 posterolateral fusion. 3. Removal of left-sided L2-3 foraminal/extra foraminal disk fragment     with decompression of the exiting L2 nerve. 4. Placement of posterior instrumentation L2, L3, (5 x 40 mm screws). 5. Insertion of interbody device x1 (11 x 27 parallel concorde bullet     cage). 6. Use of morselized allograft (DBX mix). 7. Intraoperative use of fluoroscopy.  SURGEON:  Estill Bamberg, MD  ANESTHESIA:  General endotracheal anesthesia.  COMPLICATIONS:  None.  DISPOSITION:  Stable.  ESTIMATED BLOOD LOSS:  200 mL.  INDICATIONS FOR SURGERY:  Briefly, Crystal Herrera is a very pleasant 72- year-old female who did present to me with ongoing and severe pain in her left leg.  The patient's pain distribution was very much in the distribution of the L2 nerve on the left side.  The patient states that her pain has been present since 1970s, had dramatically increased since April, over the course of the last 6 months.  Of note, she did get a left-sided L2 nerve block by provider that she was unaware of.  She did state that she did get temporary relief with her L2 nerve block.  Dr. Maurice Small did attempt various epidural injections in the region of the decompression, but I  was unable to proceed given the patient's vascular anatomy.  In any event, the patient's CAT scan and MRI did reveal nerve compression as reflected above.  Given the patient's ongoing pain and substantial disability, we did discuss proceeding with a left-sided L2-3 procedure as reflected above.  The patient did fully understand the risks and limitations of the procedure as outlined in my preoperative note.  OPERATIVE DETAILS:  On May 22, 2013, the patient was brought to surgery and general endotracheal anesthesia was administered.  The patient was placed prone on a well-padded flat Jackson bed with spinal frame.  Antibiotics were given and a time-out procedure was performed. The back was prepped and draped in the usual sterile fashion.  I then made a midline incision overlying the L2-3 interspace.  The posterolateral gutter on the right side at L2-3 was identified and subperiosteally exposed.  It was then packed.  I then exposed the transverse processes of L2 and L3 on the left side.  Using anatomical landmarks, I did use a 4-mm high-speed bur, followed by a gearshift probe followed by a 4.35 mm tap, to cannulate the L4 and L5 pedicles on the left.  Of note, intraoperative fluoroscopy was used to confirm the appropriate operative level.  Of particular note, per  my preoperative templating, the patient's L2 and L3 pedicles were extremely narrow, and I felt were only able to accommodate a 5 mm screw.  I did use a ball-tip probe to confirm that there was no cortical violation, and the cannulated pedicles holes were filled with bone wax.  At this point, I did perform a full left-sided L2-3 facetectomy.  The exiting L2 nerve was identified, and was clearly noted to be under compression, due to a disk herniation in the foraminal area, which did extend into the extraforaminal area.  The protrusion was removed by displacing the disk fragments causing the compression into the intervertebral  space.  I was very pleased with the decompression of the nerve.  I then had an assistant hold medial retraction of the traversing L3 nerve.  I then used a knife to perform an annulotomy.  I then used a series of curettes in order to perform a thorough and complete L2-3 diskectomy.  The entirety of the disk was removed.  The endplates were prepared using a series of curettes.  I then placed a series of trials, and I did feel that an 11 mm x 27 mm Concorde bulleted trial would be the most appropriate fit.  The intervertebral space was then liberally packed with DBX mix, as well as the interbody implant.  The interbody implant was then tamped into position in the usual fashion.  I was very pleased with the press fit of the implant.  I did use AP and lateral fluoroscopy, to confirm appropriate positioning of the implant.  I then placed 5 x 40 mm pedicle screws on the left side.  A 40 mm rod was secured into the heads of the screws.  Gentle compression was applied across the construct and caps were placed with the final locking procedure.  I then turned my attention towards the patient's right side. The transverse processes and the L2-3 facet joint was decorticated.  DBX mix and autograft was packed into the posterolateral gutter and into the posterior elements.  I then explored the epidural space for any undue bleeding and none was encountered.  I then copiously irrigated the wound.  The fascia was then closed using #1 Vicryl.  The subcutaneous layer was then closed using 2-0 Vicryl and the skin was then closed using 3-0 Monocryl.  Benzoin and Steri-Strips were applied followed by sterile dressing.  All instrument counts were correct at the termination of the procedure.  Of note, neurologic monitoring was used throughout the entirety of the procedure, and there was no abnormal EMG activity noted throughout the entire surgery.  Also of note, I did use triggered EMG to test each of the  screws, and there was no screw that tested below 20 milliamps.  Of note, Jason Coop was my assistant throughout the entirety of the procedure, and did aide in essential retraction and suctioning needed throughout the surgery.     Estill Bamberg, MD     MD/MEDQ  D:  05/22/2013  T:  05/23/2013  Job:  [id number]  cc:   Corwin Levins, MD Dr. Romero Belling

## 2013-05-23 NOTE — Progress Notes (Signed)
Patient reports complete resolution of left leg pain. Minimal back pain.  BP 130/58  Pulse 79  Temp(Src) 98.7 F (37.1 C) (Oral)  Resp 15  SpO2 100%  NVI Dressing CDI  POD #1 after L2/3 decompression and fusion  - back precautions at all times - up with PT/OT/nursing today - d/c PCA, start orals - SCDs for DVT prevention

## 2013-05-23 NOTE — ED Provider Notes (Signed)
I saw and evaluated the patient, reviewed the resident's note and I agree with the findings and plan.  EKG Interpretation   None       Pt w nvd. Had flu shot earlier. abd soft nt. Labs.    Suzi Roots, MD 05/23/13 [phone]

## 2013-05-23 NOTE — Progress Notes (Signed)
UR COMPLETED  

## 2013-05-23 NOTE — Evaluation (Signed)
Occupational Therapy Evaluation Patient Details Name: Crystal Herrera MRN: [id number] DOB: 09/14/1940 Today's Date: 05/23/2013 Time: 3086-5784 OT Time Calculation (min): 21 min  OT Assessment / Plan / Recommendation History of present illness Crystal Herrera is a 72 y.o. female who presents with ongoing pain in the left leg in the distribution of the L2 nerve. Patient has failed multiple ESIs and other conservative care. MRI = forminal HNP on  Left at L2/3.   Clinical Impression   Pt demos decline in function with ADLs and ADL mobility safety and would benefit from acute OT services to address impairments to help restore PLOF to return home safely    OT Assessment  Patient needs continued OT Services    Follow Up Recommendations  Home health OT;Supervision/Assistance - 24 hour    Barriers to Discharge   None  Equipment Recommendations  3 in 1 bedside comode    Recommendations for Other Services    Frequency  Min 2X/week    Precautions / Restrictions Precautions Precautions: Back Precaution Booklet Issued: Yes (comment) Precaution Comments: Pt and her husband educated on back precautons. Pt provded with handout Restrictions Weight Bearing Restrictions: No   Pertinent Vitals/Pain 7/10    ADL  Grooming: Performed;Wash/dry hands;Wash/dry face;Min guard Upper Body Bathing: Simulated;Supervision/safety;Set up Lower Body Bathing: Simulated;Moderate assistance Upper Body Dressing: Performed;Supervision/safety;Set up Lower Body Dressing: Maximal assistance Toilet Transfer: Minimal assistance;Simulated Toilet Transfer Method: Sit to stand Toileting - Clothing Manipulation and Hygiene: Moderate assistance Tub/Shower Transfer Method: Not assessed Transfers/Ambulation Related to ADLs: cues for safety, correct hand placement ADL Comments: Pt familair with ADL A/E from previous back surgery    OT Diagnosis: Generalized weakness;Acute pain  OT Problem List: Decreased  strength;Decreased knowledge of use of DME or AE;Decreased knowledge of precautions;Decreased activity tolerance;Impaired balance (sitting and/or standing);Pain OT Treatment Interventions: Self-care/ADL training;Therapeutic exercise;Patient/family education;Neuromuscular education;Balance training;Therapeutic activities;DME and/or AE instruction   OT Goals(Current goals can be found in the care plan section) Acute Rehab OT Goals Patient Stated Goal: To go home OT Goal Formulation: With patient/family Time For Goal Achievement: 05/30/13 Potential to Achieve Goals: Good ADL Goals Pt Will Perform Grooming: with min guard assist;with supervision;with set-up;standing Pt Will Perform Lower Body Bathing: with min assist;with caregiver independent in assisting;sit to/from stand Pt Will Perform Lower Body Dressing: with mod assist;with min assist;with caregiver independent in assisting;sit to/from stand Pt Will Transfer to Toilet: with min guard assist;bedside commode;ambulating;regular height toilet (3 in 1 over toilet) Pt Will Perform Toileting - Clothing Manipulation and hygiene: with mod assist;with min assist;sit to/from stand;with caregiver independent in assisting Pt Will Perform Tub/Shower Transfer: with min assist;with min guard assist;shower seat  Visit Information  Last OT Received On: 05/23/13 Assistance Needed: +1 History of Present Illness: Crystal Herrera is a 72 y.o. female who presents with ongoing pain in the left leg in the distribution of the L2 nerve. Patient has failed multiple ESIs and other conservative care. MRI = forminal HNP on  Left at L2/3.       Prior Functioning     Home Living Family/patient expects to be discharged to:: Private residence Living Arrangements: Spouse/significant other Available Help at Discharge: Family;Available 24 hours/day Type of Home: House Home Access: Stairs to enter [employer] of Steps: 2 Entrance Stairs-Rails: None Home  Layout: One level Home Equipment: Crutches Prior Function Level of Independence: Independent Communication Communication: No difficulties Dominant Hand: Right         Vision/Perception Vision - History Baseline Vision: Wears glasses  only for reading Patient Visual Report: No change from baseline Perception Perception: Within Functional Limits   Cognition  Cognition Arousal/Alertness: Awake/alert Behavior During Therapy: WFL for tasks assessed/performed Overall Cognitive Status: Within Functional Limits for tasks assessed    Extremity/Trunk Assessment Upper Extremity Assessment Upper Extremity Assessment: [facility] Lower Extremity Assessment: Defer to PT eval Cervical / Trunk Assessment Cervical / Trunk Assessment: Normal     Mobility Bed Mobility Bed Mobility: Supine to Sit;Rolling Left;Left Sidelying to Sit;Sitting - Scoot to Edge of Bed;Sit to Supine;Sit to Sidelying Left;Scooting to [facility] Rolling Left: 4: Min assist Left Sidelying to Sit: 4: Min assist Supine to Sit: 4: Min assist;With rails;HOB flat Sitting - Scoot to Edge of Bed: 4: Min guard Sit to Supine: 4: Min assist Sit to Sidelying Left: 4: Min assist Scooting to [facility]: 4: Min assist Details for Bed Mobility Assistance: Verbal cues for log roll, tactile cue for bil. LE sequencing and positioning.  Transfers Transfers: Sit to Stand;Stand to Sit Sit to Stand: 4: Min assist;From bed;From chair/3-in-1 Stand to Sit: 4: Min assist;To bed;To chair/3-in-1 Details for Transfer Assistance: Cues for UE placement (pt pulling on RW), and for precautions.      Exercise     Balance Balance Balance Assessed: Yes Dynamic Sitting Balance Dynamic Sitting - Balance Support: Feet supported;During functional activity;No upper extremity supported Dynamic Sitting - Level of Assistance: 5: Stand by assistance Dynamic Standing Balance Dynamic Standing - Balance Support: Left upper extremity supported;During functional  activity Dynamic Standing - Level of Assistance: 4: Min assist   End of Session OT - End of Session Equipment Utilized During Treatment: Gait belt;Rolling walker Activity Tolerance: Patient limited by fatigue;Patient limited by pain Patient left: in bed;with call bell/phone within reach;with family/visitor present  GO     Galen Manila 05/23/2013, 3:17 PM

## 2013-05-24 MED ORDER — POLYETHYLENE GLYCOL 3350 17 G PO PACK
17.0000 g | PACK | Freq: Every day | ORAL | Status: DC
  Administered 2013-05-24 – 2013-05-25 (×2): 17 g via ORAL
  Filled 2013-05-24 (×2): qty 1

## 2013-05-24 MED ORDER — ONDANSETRON HCL 4 MG PO TABS
4.0000 mg | ORAL_TABLET | Freq: Four times a day (QID) | ORAL | Status: DC | PRN
  Administered 2013-05-24: 4 mg via ORAL

## 2013-05-24 MED FILL — Heparin Sodium (Porcine) Inj 1000 Unit/ML: INTRAMUSCULAR | Qty: 30 | Status: AC

## 2013-05-24 MED FILL — Sodium Chloride IV Soln 0.9%: INTRAVENOUS | Qty: 1000 | Status: AC

## 2013-05-24 NOTE — Progress Notes (Signed)
Occupational Therapy Treatment and Discharge Patient Details Name: Crystal Herrera MRN: [id number] DOB: 1941-02-26 Today's Date: 05/24/2013 Time: 3329-5188 OT Time Calculation (min): 16 min  OT Assessment / Plan / Recommendation  History of present illness Crystal Herrera is a 72 y.o. female who presents with ongoing pain in the left leg in the distribution of the L2 nerve. Patient has failed multiple ESIs and other conservative care. MRI = forminal HNP on  Left at L2/3.   OT comments  All education completed with pt and husband as well as handout given. Pt would still benefit from HHOT. Acute OT will D/C from services.  Follow Up Recommendations  Home health OT;Supervision/Assistance - 24 hour       Equipment Recommendations  3 in 1 bedside comode       Frequency Min 2X/week   Progress towards OT Goals Progress towards OT goals:  (All acute OT education completed.)  Plan Discharge plan remains appropriate    Precautions / Restrictions Precautions Precautions: Back Precaution Comments: reviewed back precautions Restrictions Weight Bearing Restrictions: No       ADL  ADL Comments: Husband will A pt with LBADLs until she can do them by herself and not break back precautions. Did go over that she could don socks supine, crossing her legs "like a guy would sit" , and her husband has a sock aid that she could use. I went over how to sit on a toilet and not having anything to help her up and down (feet further apart and hands on thighs. Also recommended she use wet wipes instead of toliet paper.  Demonstrated to her and her husband how to step into and out of shower to 3n1. No further questions per pt or husband.      OT Goals(current goals can now be found in the care plan section)    Visit Information  Last OT Received On: 05/24/13 Assistance Needed: +1 History of Present Illness: Crystal Herrera is a 72 y.o. female who presents with ongoing pain in the left leg in the  distribution of the L2 nerve. Patient has failed multiple ESIs and other conservative care. MRI = forminal HNP on  Left at L2/3.          Cognition  Cognition Arousal/Alertness: Awake/alert Behavior During Therapy: WFL for tasks assessed/performed Overall Cognitive Status: Within Functional Limits for tasks assessed             End of Session OT - End of Session Patient left: in bed       Evette Georges [phone] 05/24/2013, 2:39 PM

## 2013-05-24 NOTE — Progress Notes (Signed)
Patient reports doing well, she has complete resolution of preoperative left leg pain. Minimal back pain well controlled on current medications. PT/OT was helpful yesterday, making steady progress. Does have constipation, no BM since surgery.    BP 128/51  Pulse 82  Temp(Src) 100.1 F (37.8 C) (Oral)  Resp 16  SpO2 97%  Patient laying in hospital bed comfortably, NVI, Dressing CDI, SCD's in place  POD #2 after L2/3 decompression and fusion  - Miralax/colace for constipation, enema if no BM by 12:00 - Cont PT/OT   - back precautions at all times  - Cont oral pain medications  -Scripts written in chart for D/C - SCDs for DVT prevention - D/C home Saturday after PT/OT - F/U in office 2 weeks, already scheduled

## 2013-05-24 NOTE — Progress Notes (Signed)
   CARE MANAGEMENT NOTE 05/24/2013  Patient:  HARLEEN, FINEBERG   Account Number:  [id number]  Date Initiated:  05/24/2013  Documentation initiated by:  [facility]  Subjective/Objective Assessment:   Left-sided L2-3 transforaminal lumbar interbody fusion     Action/Plan:   lives at home with husband   Anticipated DC Date:  05/25/2013   Anticipated DC Plan:  HOME W HOME HEALTH SERVICES      DC Planning Services  CM consult      [facility] Choice  HOME HEALTH   Choice offered to / List presented to:  C-3 Spouse      DME agency  [facility]     HH arranged  HH-2 PT      [facility] agency  [facility]   Status of service:  Completed, signed off Medicare Important Message given?   (If response is "NO", the following Medicare IM given date fields will be blank) Date Medicare IM given:   Date Additional Medicare IM given:    Discharge Disposition:  HOME W HOME HEALTH SERVICES  Per UR Regulation:    If discussed at Long Length of Stay Meetings, dates discussed:    Comments:  05/24/2013 1600 NCM spoke to pt and gave permission to speak to husband and dtr. Offered choice for HH and [facility] can do soc with 24-48 hours post dc. DME ordered and [facility] will deliver at time of dc. Will have Unit RN call [phone] weekend rep to deliver DME. Isidoro Donning RN CCM Case Mgmt phone [phone]

## 2013-05-24 NOTE — Progress Notes (Signed)
Physical Therapy Treatment Patient Details Name: Crystal Herrera MRN: [id number] DOB: 07/18/1941 Today's Date: 05/24/2013 Time: 0944-1000 PT Time Calculation (min): 16 min  PT Assessment / Plan / Recommendation  History of Present Illness Crystal Herrera is a 72 y.o. female who presents with ongoing pain in the left leg in the distribution of the L2 nerve. Patient has failed multiple ESIs and other conservative care. MRI = forminal HNP on  Left at L2/3.   PT Comments   Pt progressing well with therapy.  States pain much better controlled today.  Pt needs to practice 1 step & bed mobility next session before d/c home.  Encouraged pt to increase activity with Nsing.     Follow Up Recommendations  Home health PT;Outpatient PT;Supervision/Assistance - 24 hour     Does the patient have the potential to tolerate intense rehabilitation     Barriers to Discharge        Equipment Recommendations  Rolling walker with 5" wheels    Recommendations for Other Services    Frequency Min 6X/week   Progress towards PT Goals Progress towards PT goals: Progressing toward goals  Plan Current plan remains appropriate    Precautions / Restrictions Precautions Precautions: Back Precaution Comments: reviewed back precautions Restrictions Weight Bearing Restrictions: No   Pertinent Vitals/Pain "it's not bad at all" in response to asking about pain.      Mobility  Bed Mobility Bed Mobility: Sit to Sidelying Left Sit to Sidelying Left: 4: Min assist;HOB flat Details for Bed Mobility Assistance: Cues for sequencing & technique.  (A) to lift LE's back into bed & to prevent twisting with side>supine Transfers Transfers: Sit to Stand;Stand to Sit Sit to Stand: 4: Min guard;With upper extremity assist;With armrests;From chair/3-in-1 Stand to Sit: 4: Min guard;With upper extremity assist;To bed Details for Transfer Assistance: cues for safe hand placement.   Ambulation/Gait Ambulation/Gait  Assistance: 4: Min guard Ambulation Distance (Feet): 180 Feet Assistive device: Rolling walker Ambulation/Gait Assistance Details: Guarding for safety.  Cues to relax UE's.   Gait Pattern: Step-through pattern;Decreased stride length Gait velocity: decreased Stairs: No Wheelchair Mobility Wheelchair Mobility: No      PT Goals (current goals can now be found in the care plan section) Acute Rehab PT Goals PT Goal Formulation: With patient Time For Goal Achievement: 05/30/13 Potential to Achieve Goals: Good  Visit Information  Last PT Received On: 05/24/13 Assistance Needed: +1 History of Present Illness: Crystal Herrera is a 72 y.o. female who presents with ongoing pain in the left leg in the distribution of the L2 nerve. Patient has failed multiple ESIs and other conservative care. MRI = forminal HNP on  Left at L2/3.    Subjective Data      Cognition  Cognition Arousal/Alertness: Awake/alert Behavior During Therapy: WFL for tasks assessed/performed Overall Cognitive Status: Within Functional Limits for tasks assessed    Balance     End of Session PT - End of Session Activity Tolerance: Patient tolerated treatment well Patient left: in bed;with call bell/phone within reach Nurse Communication: Mobility status   GP     Lara Mulch 05/24/2013, 10:03 AM  Verdell Face, PTA [phone] 05/24/2013

## 2013-05-25 NOTE — Progress Notes (Signed)
PATIENT ID: Crystal Herrera  MRN: [id number]  DOB/AGE:  [birth year] / 72 y.o.  3 Days Post-Op Procedure(s) (LRB): TRANSFORAMINAL LUMBAR INTERBODY FUSION (TLIF) WITH PEDICLE SCREW FIXATION 1 LEVEL (Left)    PROGRESS NOTE Subjective:   Patient is alert, oriented, no Nausea, no Vomiting, yes passing gas, no Bowel Movement. Taking PO well, pt up eating. Denies SOB, Chest or Calf Pain. Using [employer]. Ambulate WBAT, Patient reports pain as mild.    Objective: Vital signs in last 24 hours: Temp:  [97.6 F (36.4 C)-99.2 F (37.3 C)] 99.2 F (37.3 C) (11/01 0521) Pulse Rate:  [82-89] 84 (11/01 0521) Resp:  [16-18] 16 (11/01 0521) BP: (112-141)/(52-60) 141/60 mmHg (11/01 0521) SpO2:  [93 %-97 %] 93 % (11/01 0521)    Intake/Output from previous day: I/O last 3 completed shifts: In: 1795 [P.O.:360; I.V.:1435] Out: -    Intake/Output this shift:     LABORATORY DATA:  Recent Labs  05/22/13 0941  NA 142  K 3.4*  CL 103  CO2 28  BUN 12  CREATININE 0.82  GLUCOSE 98  INR 0.97  CALCIUM 9.1    Examination: Neurologically intact Neurovascular intact Sensation intact distally Intact pulses distally Dorsiflexion/Plantar flexion intact Incision: no drainage No cellulitis present}  Assessment:   3 Days Post-Op Procedure(s) (LRB): TRANSFORAMINAL LUMBAR INTERBODY FUSION (TLIF) WITH PEDICLE SCREW FIXATION 1 LEVEL (Left) ADDITIONAL DIAGNOSIS:  none  Plan:  Weight Bearing as Tolerated (WBAT)  DVT Prophylaxis:  SCDs  DISCHARGE PLAN: Home  DISCHARGE NEEDS: D/C home once pt passes PT goals   Follow up in 2 weeks with Dr. Yevette Edwards.     [name] 05/25/2013, 8:13 AM

## 2013-05-25 NOTE — Progress Notes (Signed)
Physical Therapy Treatment Patient Details Name: Crystal Herrera MRN: [id number] DOB: 1940-10-11 Today's Date: 05/25/2013 Time: 4098-1191 PT Time Calculation (min): 13 min  PT Assessment / Plan / Recommendation  History of Present Illness Crystal Herrera is a 72 y.o. female who presents with ongoing pain in the left leg in the distribution of the L2 nerve. Patient has failed multiple ESIs and other conservative care. MRI = forminal HNP on  Left at L2/3.   PT Comments   Pt moving well.  Completed stair training this session.  Pt safe to d/c home from mobility standpoint.    Follow Up Recommendations  Home health PT;Outpatient PT;Supervision/Assistance - 24 hour     Does the patient have the potential to tolerate intense rehabilitation     Barriers to Discharge        Equipment Recommendations  Rolling walker with 5" wheels    Recommendations for Other Services    Frequency Min 6X/week   Progress towards PT Goals Progress towards PT goals: Progressing toward goals  Plan Current plan remains appropriate    Precautions / Restrictions Precautions Precautions: Back   Pertinent Vitals/Pain 6/10 back.  RN notified. Repositioned for comfort    Mobility  Bed Mobility Bed Mobility: Rolling Left;Left Sidelying to Sit;Sitting - Scoot to Edge of Bed Rolling Left: 6: Modified independent (Device/Increase time) Left Sidelying to Sit: 6: Modified independent (Device/Increase time);HOB flat Transfers Transfers: Sit to Stand;Stand to Sit Sit to Stand: 5: Supervision;With upper extremity assist;From bed;From toilet Stand to Sit: 5: Supervision;With upper extremity assist;With armrests;To toilet;To chair/3-in-1 Details for Transfer Assistance: cues to reinforce safe hand placement Ambulation/Gait Ambulation/Gait Assistance: 5: Supervision Ambulation Distance (Feet): 180 Feet Assistive device: Rolling walker Ambulation/Gait Assistance Details: cues to keep RW closer to body & to relax  UE's.  Gait Pattern: Step-through pattern Stairs: Yes Stairs Assistance: 4: Min guard Stair Management Technique: No rails;Step to pattern;Forwards;With walker Number of Stairs: 1 Wheelchair Mobility Wheelchair Mobility: No      PT Goals (current goals can now be found in the care plan section) Acute Rehab PT Goals PT Goal Formulation: With patient Time For Goal Achievement: 05/30/13 Potential to Achieve Goals: Good  Visit Information  Last PT Received On: 05/25/13 Assistance Needed: +1 History of Present Illness: Crystal Herrera is a 72 y.o. female who presents with ongoing pain in the left leg in the distribution of the L2 nerve. Patient has failed multiple ESIs and other conservative care. MRI = forminal HNP on  Left at L2/3.    Subjective Data      Cognition  Cognition Arousal/Alertness: Awake/alert Behavior During Therapy: WFL for tasks assessed/performed Overall Cognitive Status: Within Functional Limits for tasks assessed    Balance     End of Session PT - End of Session Activity Tolerance: Patient tolerated treatment well Patient left: in chair;with call bell/phone within reach;with family/visitor present Nurse Communication: Mobility status   GP     Lara Mulch 05/25/2013, 1:55 PM   Verdell Face, PTA [phone] 05/25/2013

## 2013-05-25 NOTE — Discharge Summary (Signed)
Patient ID: Crystal Herrera MRN: [id number] DOB/AGE: [birth year] 72 y.o.  Admit date: 05/22/2013 Discharge date: 05/25/2013  Admission Diagnoses:  Active Problems:   * No active hospital problems. *   Discharge Diagnoses:  Same  Past Medical History  Diagnosis Date  . HYPERLIPIDEMIA 02/09/2010  . HYPERTENSION 02/09/2010  . GERD 02/09/2010  . SKIN LESION 03/22/2010  . OCCIPITAL NEURALGIA 06/09/2010  . BACK PAIN, CHRONIC 02/09/2010  . OSTEOPENIA 02/09/2010  . FATIGUE 02/09/2010  . Diarrhea 04/26/2010  . COLONIC POLYPS, HX OF 02/09/2010  . Lumbar disc disease   . Complication of anesthesia   . PONV (postoperative nausea and vomiting)   . DEPRESSION 02/09/2010    pt. takes Cymbalta for pain issues   . COMMON MIGRAINE 02/09/2010    neuritis in R side of neck & head  . Neuromuscular disorder     neuritis in head & neck  . DEGENERATIVE JOINT DISEASE 02/09/2010    hands , lumbar stenosis   . Varicose vein of leg     BLE  . Pneumonia     "as an infant" (05/15/2012)  . Anemia 1948?  Marland Kitchen Allergic rhinitis, cause unspecified 01/29/2013    Surgeries: Procedure(s): TRANSFORAMINAL LUMBAR INTERBODY FUSION (TLIF) WITH PEDICLE SCREW FIXATION 1 LEVEL on 05/22/2013   Consultants:    Discharged Condition: Improved  Hospital Course: Crystal Herrera is an 72 y.o. female who was admitted 05/22/2013 for operative treatment of<principal problem not specified>. Patient has severe unremitting pain that affects sleep, daily activities, and work/hobbies. After pre-op clearance the patient was taken to the operating room on 05/22/2013 and underwent  Procedure(s): TRANSFORAMINAL LUMBAR INTERBODY FUSION (TLIF) WITH PEDICLE SCREW FIXATION 1 LEVEL.    Patient was given perioperative antibiotics: Anti-infectives   Start     Dose/Rate Route Frequency Ordered Stop   05/22/13 1900  clindamycin (CLEOCIN) IVPB 600 mg     600 mg 100 mL/hr over 30 Minutes Intravenous  Once 05/22/13 1455 05/22/13 1914   05/22/13  0945  clindamycin (CLEOCIN) IVPB 600 mg     600 mg 100 mL/hr over 30 Minutes Intravenous  Once 05/22/13 0944 05/22/13 1128   05/22/13 0931  vancomycin (VANCOCIN) IVPB 1000 mg/200 mL premix  Status:  Discontinued     1,000 mg 200 mL/hr over 60 Minutes Intravenous On call to O.R. 05/22/13 0931 05/22/13 1728       Patient was given sequential compression devices, early ambulation, and chemoprophylaxis to prevent DVT.  Patient benefited maximally from hospital stay and there were no complications.    Recent vital signs: Patient Vitals for the past 24 hrs:  BP Temp Pulse Resp SpO2  05/25/13 0521 141/60 mmHg 99.2 F (37.3 C) 84 16 93 %  05/24/13 2026 112/52 mmHg 98.6 F (37 C) 89 16 97 %  05/24/13 1356 135/57 mmHg 97.6 F (36.4 C) 82 18 95 %     Recent laboratory studies:  Recent Labs  05/22/13 0941  NA 142  K 3.4*  CL 103  CO2 28  BUN 12  CREATININE 0.82  GLUCOSE 98  INR 0.97  CALCIUM 9.1     Discharge Medications:     Medication List    STOP taking these medications       carisoprodol 350 MG tablet  Commonly known as:  SOMA      TAKE these medications       acetaminophen 500 MG tablet  Commonly known as:  TYLENOL  Take 1,000 mg by mouth every  6 (six) hours as needed for pain.     calcium gluconate 500 MG tablet  Take 500 mg by mouth 2 (two) times daily.     diclofenac sodium 1 % Gel  Commonly known as:  VOLTAREN  Apply 1 application topically 2 (two) times daily as needed (for hand pain).     Evening Primrose Oil 1000 MG Caps  Take 1,000 mg by mouth daily.     hydrochlorothiazide 12.5 MG capsule  Commonly known as:  MICROZIDE  Take 1 capsule (12.5 mg total) by mouth daily after breakfast.     simvastatin 20 MG tablet  Commonly known as:  ZOCOR  Take 1 tablet by mouth at  bedtime        Diagnostic Studies: Dg Chest 2 View  05/22/2013   CLINICAL DATA:  Preop for lumbar surgery  EXAM: CHEST  2 VIEW  COMPARISON:  05/10/2012  FINDINGS:  Cardiomediastinal silhouette is stable. No acute infiltrate or pleural effusion. No pulmonary edema. Stable mild degenerative changes thoracic spine.  IMPRESSION: No active cardiopulmonary disease.  No significant change.   Electronically Signed   By: Natasha Mead M.D.   On: 05/22/2013 09:52   Dg Lumbar Spine 2-3 Views  05/22/2013   CLINICAL DATA:  L2-L3 TLIF  EXAM: DG C-ARM 1-60 MIN; LUMBAR SPINE - 2-3 VIEW  COMPARISON:  MRI lumbar spine 11/23/2012  FINDINGS: AP and lateral intraoperative fluoroscopic views demonstrate left-sided posterior rod and pedicle screws spanning L2-3. There is an interbody spacer at L2-3.  IMPRESSION: Intraoperative spot fluoroscopic views of the lumbar spine as described above.   Electronically Signed   By: Britta Mccreedy M.D.   On: 05/22/2013 17:27   Dg Lumbar Spine 2-3 Views  05/22/2013   CLINICAL DATA:  L2-L3 posterior lumbar interbody fusion, intraoperative radiographs  EXAM: LUMBAR SPINE - 2-3 VIEW  COMPARISON:  Preoperative CT myelogram 04/10/2013  FINDINGS: Two metallic needle probes are present. The more superior probe is directed toward the inferior aspect of the T12 spinous process of while the more inferior probe is overlying the L1 spinous process.  IMPRESSION: The metallic localizing needles are directed toward the T12 and L1 spinous processes.   Electronically Signed   By: Malachy Moan M.D.   On: 05/22/2013 13:15   Dg C-arm 1-60 Min  05/22/2013   CLINICAL DATA:  L2-L3 TLIF  EXAM: DG C-ARM 1-60 MIN; LUMBAR SPINE - 2-3 VIEW  COMPARISON:  MRI lumbar spine 11/23/2012  FINDINGS: AP and lateral intraoperative fluoroscopic views demonstrate left-sided posterior rod and pedicle screws spanning L2-3. There is an interbody spacer at L2-3.  IMPRESSION: Intraoperative spot fluoroscopic views of the lumbar spine as described above.   Electronically Signed   By: Britta Mccreedy M.D.   On: 05/22/2013 17:27    Disposition: 01-Home or Self Care      Discharge Orders    Future Orders Complete By Expires   Call MD / Call 911  As directed    Comments:     If you experience chest pain or shortness of breath, CALL 911 and be transported to the hospital emergency room.  If you develope a fever above 101 F, pus (white drainage) or increased drainage or redness at the wound, or calf pain, call your surgeon's office.   Constipation Prevention  As directed    Comments:     Drink plenty of fluids.  Prune juice may be helpful.  You may use a stool softener, such as Colace (over  the counter) 100 mg twice a day.  Use MiraLax (over the counter) for constipation as needed.   Diet - low sodium heart healthy  As directed    Discharge instructions  As directed    Comments:     Follow up in 2 weeks in office with Dr. Yevette Edwards.   Driving restrictions  As directed    Comments:     No driving for 2 weeks   Increase activity slowly as tolerated  As directed       Follow-up Information   Follow up with [facility]. (Home Health Physical Therapy and Occupational Therapy)    Contact information:   [phone]       Signed: Lyal Husted R 05/25/2013, 8:36 AM

## 2013-05-27 DIAGNOSIS — I10 Essential (primary) hypertension: Secondary | ICD-10-CM | POA: Diagnosis not present

## 2013-05-27 DIAGNOSIS — M48061 Spinal stenosis, lumbar region without neurogenic claudication: Secondary | ICD-10-CM | POA: Diagnosis not present

## 2013-05-27 DIAGNOSIS — M545 Low back pain: Secondary | ICD-10-CM | POA: Diagnosis not present

## 2013-05-27 DIAGNOSIS — M6281 Muscle weakness (generalized): Secondary | ICD-10-CM | POA: Diagnosis not present

## 2013-05-27 DIAGNOSIS — Z5189 Encounter for other specified aftercare: Secondary | ICD-10-CM | POA: Diagnosis not present

## 2013-05-27 DIAGNOSIS — R269 Unspecified abnormalities of gait and mobility: Secondary | ICD-10-CM | POA: Diagnosis not present

## 2013-05-27 DIAGNOSIS — Z4789 Encounter for other orthopedic aftercare: Secondary | ICD-10-CM | POA: Diagnosis not present

## 2013-05-28 ENCOUNTER — Telehealth: Payer: Self-pay | Admitting: Internal Medicine

## 2013-05-28 NOTE — Telephone Encounter (Signed)
Rec'd from [facility] forward 3 pages to Dr.John

## 2013-05-30 ENCOUNTER — Other Ambulatory Visit: Payer: Self-pay

## 2013-06-11 ENCOUNTER — Telehealth: Payer: Self-pay | Admitting: Internal Medicine

## 2013-06-11 NOTE — Telephone Encounter (Signed)
Rec'd from [facility] forward 2 pages Dr. Jonny Ruiz

## 2013-07-03 ENCOUNTER — Other Ambulatory Visit: Payer: Self-pay | Admitting: Internal Medicine

## 2013-07-10 ENCOUNTER — Telehealth: Payer: Self-pay | Admitting: Internal Medicine

## 2013-07-10 NOTE — Telephone Encounter (Signed)
Recd records from [facility]., Forwarding 2pgs to Dr.John °

## 2013-08-02 DIAGNOSIS — M545 Low back pain, unspecified: Secondary | ICD-10-CM | POA: Diagnosis not present

## 2013-08-08 ENCOUNTER — Telehealth: Payer: Self-pay | Admitting: Internal Medicine

## 2013-08-08 NOTE — Telephone Encounter (Signed)
Recd  Records from [place] 2pgs to [place]

## 2013-08-20 ENCOUNTER — Other Ambulatory Visit: Payer: Self-pay | Admitting: Orthopedic Surgery

## 2013-08-20 DIAGNOSIS — M545 Low back pain, unspecified: Secondary | ICD-10-CM

## 2013-08-27 ENCOUNTER — Ambulatory Visit
Admission: RE | Admit: 2013-08-27 | Discharge: 2013-08-27 | Disposition: A | Discharge status: Home / Self Care | Payer: Medicare Other | Source: Ambulatory Visit | Attending: Orthopedic Surgery | Admitting: Orthopedic Surgery

## 2013-08-27 DIAGNOSIS — M545 Low back pain, unspecified: Secondary | ICD-10-CM

## 2013-08-27 DIAGNOSIS — M539 Dorsopathy, unspecified: Secondary | ICD-10-CM | POA: Diagnosis not present

## 2013-08-30 DIAGNOSIS — M545 Low back pain, unspecified: Secondary | ICD-10-CM | POA: Diagnosis not present

## 2013-09-03 ENCOUNTER — Telehealth: Payer: Self-pay | Admitting: Internal Medicine

## 2013-09-03 NOTE — Telephone Encounter (Signed)
Received 3 pages from [place], sent to Dr. Jenny Reichmann. 09/03/13/ss

## 2013-09-06 DIAGNOSIS — M79609 Pain in unspecified limb: Secondary | ICD-10-CM | POA: Diagnosis not present

## 2013-09-13 ENCOUNTER — Other Ambulatory Visit: Payer: Self-pay | Admitting: Internal Medicine

## 2013-09-23 DIAGNOSIS — M7750 Other enthesopathy of unspecified foot: Secondary | ICD-10-CM | POA: Diagnosis not present

## 2013-09-23 DIAGNOSIS — R262 Difficulty in walking, not elsewhere classified: Secondary | ICD-10-CM | POA: Diagnosis not present

## 2013-09-23 DIAGNOSIS — M79609 Pain in unspecified limb: Secondary | ICD-10-CM | POA: Diagnosis not present

## 2013-09-23 DIAGNOSIS — G5790 Unspecified mononeuropathy of unspecified lower limb: Secondary | ICD-10-CM | POA: Diagnosis not present

## 2013-09-23 DIAGNOSIS — G5760 Lesion of plantar nerve, unspecified lower limb: Secondary | ICD-10-CM | POA: Diagnosis not present

## 2013-09-26 ENCOUNTER — Telehealth: Payer: Self-pay | Admitting: Internal Medicine

## 2013-09-26 DIAGNOSIS — E785 Hyperlipidemia, unspecified: Secondary | ICD-10-CM | POA: Diagnosis not present

## 2013-09-26 DIAGNOSIS — R0789 Other chest pain: Secondary | ICD-10-CM | POA: Diagnosis not present

## 2013-09-26 DIAGNOSIS — I10 Essential (primary) hypertension: Secondary | ICD-10-CM | POA: Diagnosis not present

## 2013-09-26 DIAGNOSIS — G8929 Other chronic pain: Secondary | ICD-10-CM | POA: Diagnosis not present

## 2013-09-26 DIAGNOSIS — F329 Major depressive disorder, single episode, unspecified: Secondary | ICD-10-CM | POA: Diagnosis not present

## 2013-09-26 DIAGNOSIS — M549 Dorsalgia, unspecified: Secondary | ICD-10-CM | POA: Diagnosis not present

## 2013-09-26 DIAGNOSIS — F3289 Other specified depressive episodes: Secondary | ICD-10-CM | POA: Diagnosis not present

## 2013-09-26 DIAGNOSIS — R079 Chest pain, unspecified: Secondary | ICD-10-CM | POA: Diagnosis not present

## 2013-09-26 NOTE — Telephone Encounter (Signed)
Patient Information:  Caller Name: [place]  Phone: [phone]  Patient: Crystal Herrera, Crystal Herrera  Gender: Female  DOB: [birth year]  Age: 73 Years  PCP: Cathlean Cower (Adults only)  Office Follow Up:  Does the office need to follow up with this patient?: Yes  Instructions For The Office: FYI at [facility], chest pressure since yesterday, 09/25/2013 and refused 911 but will go to the ER. RN/CAN advised also doing the Stress Test when she gets home that was advised by Dr.  Shelly Bombard Note:  Afebrile. She has cardiac risk factors, woke up with chest pressure that is not relieved and did not go for the recommended "Stress Test" she was advised to do. She refused RN/CAN advice of 911 and wanted to wait until she got home for trip (At [facility]). RN/CAN advised at least going to the ER NOW and following up at home with the stress test. She will go to ER now and husband will drive.  Symptoms  Reason For Call & Symptoms: chest pain pressure in the chest  Reviewed Health History In EMR: Yes  Reviewed Medications In EMR: Yes  Reviewed Allergies In EMR: Yes  Reviewed Surgeries / Procedures: Yes  Date of Onset of Symptoms: 09/25/2013  Treatments Tried: rest, Tylenol  Treatments Tried Worked: No  Guideline(s) Used:  Chest Pain  Disposition Per Guideline:   Call EMS 911 Now  Reason For Disposition Reached:   Chest pain lasting longer than 5 minutes and ANY of the following:  Over 54 years old Over 19 years old and at least one cardiac risk factor (i.e., high blood pressure, diabetes, high cholesterol, obesity, smoker or strong family history of heart disease) Pain is crushing, pressure-like, or heavy  Took nitroglycerin and chest pain was not relieved History of heart disease (i.e., angina, heart attack, bypass surgery, angioplasty, CHF)  Advice Given:  Fleeting Chest Pain:  Fleeting chest pains that last only a few seconds and then go away are generally not serious. They may be from pinched muscles or  nerves in your chest wall.  Chest Pain Only When Coughing:  Chest pains that occur with coughing generally come from the chest wall and from irritation of the airways. They are usually not serious.  Patient Refused Recommendation:  Patient Will Go To ED  She agreed to the ER.

## 2013-09-27 DIAGNOSIS — R0789 Other chest pain: Secondary | ICD-10-CM | POA: Diagnosis not present

## 2013-09-27 DIAGNOSIS — E785 Hyperlipidemia, unspecified: Secondary | ICD-10-CM | POA: Diagnosis not present

## 2013-09-27 DIAGNOSIS — F329 Major depressive disorder, single episode, unspecified: Secondary | ICD-10-CM | POA: Diagnosis not present

## 2013-09-27 DIAGNOSIS — F3289 Other specified depressive episodes: Secondary | ICD-10-CM | POA: Diagnosis not present

## 2013-09-27 DIAGNOSIS — I10 Essential (primary) hypertension: Secondary | ICD-10-CM | POA: Diagnosis not present

## 2013-10-01 DIAGNOSIS — G5760 Lesion of plantar nerve, unspecified lower limb: Secondary | ICD-10-CM | POA: Diagnosis not present

## 2013-10-01 DIAGNOSIS — M7750 Other enthesopathy of unspecified foot: Secondary | ICD-10-CM | POA: Diagnosis not present

## 2013-10-01 DIAGNOSIS — M779 Enthesopathy, unspecified: Secondary | ICD-10-CM | POA: Diagnosis not present

## 2013-10-01 DIAGNOSIS — M12279 Villonodular synovitis (pigmented), unspecified ankle and foot: Secondary | ICD-10-CM | POA: Diagnosis not present

## 2013-10-01 DIAGNOSIS — G5790 Unspecified mononeuropathy of unspecified lower limb: Secondary | ICD-10-CM | POA: Diagnosis not present

## 2013-10-04 ENCOUNTER — Encounter: Payer: Self-pay | Admitting: Internal Medicine

## 2013-10-04 ENCOUNTER — Ambulatory Visit (INDEPENDENT_AMBULATORY_CARE_PROVIDER_SITE_OTHER): Payer: Medicare Other | Admitting: Internal Medicine

## 2013-10-04 VITALS — BP 132/80 | HR 88 | Temp 97.6°F | Ht 65.0 in | Wt 157.0 lb

## 2013-10-04 DIAGNOSIS — M545 Low back pain, unspecified: Secondary | ICD-10-CM | POA: Diagnosis not present

## 2013-10-04 DIAGNOSIS — K219 Gastro-esophageal reflux disease without esophagitis: Secondary | ICD-10-CM

## 2013-10-04 DIAGNOSIS — I10 Essential (primary) hypertension: Secondary | ICD-10-CM

## 2013-10-04 DIAGNOSIS — Z23 Encounter for immunization: Secondary | ICD-10-CM | POA: Diagnosis not present

## 2013-10-04 DIAGNOSIS — E785 Hyperlipidemia, unspecified: Secondary | ICD-10-CM

## 2013-10-04 MED ORDER — PANTOPRAZOLE SODIUM 40 MG PO TBEC: 40.0000 mg | DELAYED_RELEASE_TABLET | Freq: Every day | ORAL | Status: DC

## 2013-10-04 NOTE — Addendum Note (Signed)
Addended by: Sharon Seller B on: 10/04/2013 02:47 PM   Modules accepted: Orders

## 2013-10-04 NOTE — Assessment & Plan Note (Signed)
[place] for protonix 40 qd,  to f/u any worsening symptoms or concerns

## 2013-10-04 NOTE — Assessment & Plan Note (Signed)
stable overall by history and exam, recent data reviewed with pt, and pt to continue medical treatment as before,  to f/u any worsening symptoms or concerns BP Readings from Last 3 Encounters:  10/04/13 132/80  05/25/13 141/60  05/22/13 141/66

## 2013-10-04 NOTE — Progress Notes (Signed)
Subjective:    Patient ID: Crystal Herrera, female    DOB: Jul 03, 1941, 73 y.o.   MRN: [id number]  HPI  Here to f/u; recent seen in ER at [facility] with Mi r/o'd and Stress test negative.  Here to f/u; overall doing ok,  Pt denies chest pain, increased sob or doe, wheezing, orthopnea, PND, increased LE swelling, palpitations, dizziness or syncope.  Pt denies polydipsia, polyuria, or low sugar symptoms such as weakness or confusion improved with po intake.  Pt denies new neurological symptoms such as new headache, or facial or extremity weakness or numbness.   Pt states overall good compliance with meds, has been trying to follow lower cholesterol diet, with wt overall stable,  but little exercise, though trying to do better after lumbar surgury oct 2014.  Also with left foot pain slowing her down as well, sees ortho for that.  Has had mild worsening reflux, but no other abd pain, dysphagia, n/v, bowel change or blood. Hx gastritis per egd  2012. Husbands omeprazole helps but still postprandial nausea noted for 1 hr Past Medical History  Diagnosis Date  . HYPERLIPIDEMIA 02/09/2010  . HYPERTENSION 02/09/2010  . GERD 02/09/2010  . SKIN LESION 03/22/2010  . OCCIPITAL NEURALGIA 06/09/2010  . BACK PAIN, CHRONIC 02/09/2010  . OSTEOPENIA 02/09/2010  . FATIGUE 02/09/2010  . Diarrhea 04/26/2010  . COLONIC POLYPS, HX OF 02/09/2010  . Lumbar disc disease   . Complication of anesthesia   . PONV (postoperative nausea and vomiting)   . DEPRESSION 02/09/2010    pt. takes Cymbalta for pain issues   . COMMON MIGRAINE 02/09/2010    neuritis in R side of neck & head  . Neuromuscular disorder     neuritis in head & neck  . DEGENERATIVE JOINT DISEASE 02/09/2010    hands , lumbar stenosis   . Varicose vein of leg     BLE  . Pneumonia     "as an infant" (05/15/2012)  . Anemia 1948?  Marland Kitchen Allergic rhinitis, cause unspecified 01/29/2013   Past Surgical History  Procedure Laterality Date  . Chymopapain injection   1986    Lumbar  . Lumbar laminectomy  1986  . Ovarian cyst removal  2006  . Hernia repair  05/15/2012    ventral incisional   . Cholecystectomy  ? 2009  . Varicose vein surgery  ~ 2008    bilaterally  . Breast biopsy  1988    left; Neg  . Tubal ligation  1970's  . Dilation and curettage of uterus  1960's  . Ventral hernia repair  05/15/2012    Procedure: LAPAROSCOPIC VENTRAL HERNIA;  Surgeon: Gayland Curry, MD,FACS;  Location: [place];  Service: General;  Laterality: N/A;  laparoscopic ventral incisional hernia repair with mesh  . Colonoscopy    . Lumbar fusion  05/22/2013    L2  L3 decompression /fusion      Dr Lynann Bologna  . Transforaminal lumbar interbody fusion (tlif) with pedicle screw fixation 1 level Left 05/22/2013    Procedure: TRANSFORAMINAL LUMBAR INTERBODY FUSION (TLIF) WITH PEDICLE SCREW FIXATION 1 LEVEL;  Surgeon: Sinclair Ship, MD;  Location: [place];  Service: Orthopedics;  Laterality: Left;  Left sided lumbar 2-3 Transforaminal lumbar interbody fusion with instrumentation, allograft    reports that she quit smoking about 42 years ago. Her smoking use included Cigarettes. She has a 10 pack-year smoking history. She has never used smokeless tobacco. She reports that she drinks about 1.8 ounces of  alcohol per week. She reports that she does not use illicit drugs. family history includes Breast cancer in her maternal grandmother; Cancer in her maternal grandmother; Colon cancer in her maternal aunt; Diabetes in her father; Lung cancer in her mother. Allergies  Allergen Reactions  . Cefuroxime Other (See Comments)    Unknown    . Cephalosporins Other (See Comments)    Unknown   . Citalopram Nausea Only  . Escitalopram Nausea Only  . Penicillins Hives  . Sulfonamide Derivatives Hives  . Vancomycin Rash    rash   Current Outpatient Prescriptions on File Prior to Visit  Medication Sig Dispense Refill  . diclofenac sodium (VOLTAREN) 1 % GEL Apply 1 application topically  2 (two) times daily as needed (for hand pain).      . Evening Primrose Oil 1000 MG CAPS Take 1,000 mg by mouth daily.      . hydrochlorothiazide (MICROZIDE) 12.5 MG capsule Take 1 capsule by mouth  daily after breakfast  90 capsule  2  . simvastatin (ZOCOR) 20 MG tablet Take 1 tablet by mouth at   bedtime  90 tablet  1  . acetaminophen (TYLENOL) 500 MG tablet Take 1,000 mg by mouth every 6 (six) hours as needed for pain.      . calcium gluconate 500 MG tablet Take 500 mg by mouth 2 (two) times daily.         No current facility-administered medications on file prior to visit.    Review of Systems  Constitutional: Negative for unexpected weight change, or unusual diaphoresis  HENT: Negative for tinnitus.   Eyes: Negative for photophobia and visual disturbance.  Respiratory: Negative for choking and stridor.   Gastrointestinal: Negative for vomiting and blood in stool.  Genitourinary: Negative for hematuria and decreased urine volume.  Musculoskeletal: Negative for acute joint swelling Skin: Negative for color change and wound.  Neurological: Negative for tremors and numbness other than noted  Psychiatric/Behavioral: Negative for decreased concentration or  hyperactivity.       Objective:   Physical Exam BP 132/80  Pulse 88  Temp(Src) 97.6 F (36.4 C) (Oral)  Ht 5\' 5"  (1.651 m)  Wt 157 lb (71.215 kg)  BMI 26.13 kg/m2  SpO2 97% VS noted,  Constitutional: Pt appears well-developed and well-nourished.  HENT: Head: NCAT.  Right Ear: External ear normal.  Left Ear: External ear normal.  Eyes: Conjunctivae and EOM are normal. Pupils are equal, round, and reactive to light.  Neck: Normal range of motion. Neck supple.  Cardiovascular: Normal rate and regular rhythm.   Pulmonary/Chest: Effort normal and breath sounds normal.  Abd:  Soft, NT, non-distended, + BS Neurological: Pt is alert. Not confused  Skin: Skin is warm. No erythema.  Psychiatric: Pt behavior is normal. Thought  content normal.     Assessment & Plan:

## 2013-10-04 NOTE — Patient Instructions (Addendum)
You had the tetanus shot today (Td), adn the new Prevnar pneumonia shots today  Please take all new medication as prescribed - the protonix 40 mg per day  Please continue all other medications as before, and refills have been done if requested. Please have the pharmacy call with any other refills you may need.  Please continue your efforts at being more active, low cholesterol diet, and weight control.  Please keep your appointments with your specialists as you may have planned  No further lab work needed today  Please return in 6 months, or sooner if needed

## 2013-10-04 NOTE — Progress Notes (Signed)
Pre visit review using our clinic review tool, if applicable. No additional management support is needed unless otherwise documented below in the visit note. 

## 2013-10-04 NOTE — Assessment & Plan Note (Signed)
stable overall by history and exam, recent data reviewed with pt, and pt to continue medical treatment as before,  to f/u any worsening symptoms or concerns Lab Results  Component Value Date   LDLCALC 58 05/24/2011

## 2013-10-07 ENCOUNTER — Telehealth: Payer: Self-pay | Admitting: Internal Medicine

## 2013-10-07 NOTE — Telephone Encounter (Signed)
Relevant patient education assigned to patient using Emmi. ° °

## 2013-10-08 DIAGNOSIS — G5760 Lesion of plantar nerve, unspecified lower limb: Secondary | ICD-10-CM | POA: Diagnosis not present

## 2013-10-08 DIAGNOSIS — M79609 Pain in unspecified limb: Secondary | ICD-10-CM | POA: Diagnosis not present

## 2013-10-08 DIAGNOSIS — G5790 Unspecified mononeuropathy of unspecified lower limb: Secondary | ICD-10-CM | POA: Diagnosis not present

## 2013-10-08 DIAGNOSIS — R262 Difficulty in walking, not elsewhere classified: Secondary | ICD-10-CM | POA: Diagnosis not present

## 2013-10-08 DIAGNOSIS — M7750 Other enthesopathy of unspecified foot: Secondary | ICD-10-CM | POA: Diagnosis not present

## 2013-10-28 ENCOUNTER — Other Ambulatory Visit: Payer: Self-pay | Admitting: Orthopedic Surgery

## 2013-10-28 DIAGNOSIS — M25562 Pain in left knee: Secondary | ICD-10-CM

## 2013-10-30 DIAGNOSIS — M25569 Pain in unspecified knee: Secondary | ICD-10-CM | POA: Diagnosis not present

## 2013-10-31 ENCOUNTER — Ambulatory Visit
Admission: RE | Admit: 2013-10-31 | Discharge: 2013-10-31 | Disposition: A | Discharge status: Home / Self Care | Payer: Medicare Other | Source: Ambulatory Visit | Attending: Orthopedic Surgery | Admitting: Orthopedic Surgery

## 2013-10-31 DIAGNOSIS — M1710 Unilateral primary osteoarthritis, unspecified knee: Secondary | ICD-10-CM | POA: Diagnosis not present

## 2013-10-31 DIAGNOSIS — IMO0002 Reserved for concepts with insufficient information to code with codable children: Secondary | ICD-10-CM | POA: Diagnosis not present

## 2013-10-31 DIAGNOSIS — M25562 Pain in left knee: Secondary | ICD-10-CM

## 2013-11-01 ENCOUNTER — Telehealth: Payer: Self-pay | Admitting: Internal Medicine

## 2013-11-01 NOTE — Telephone Encounter (Signed)
Received 2 pages from [place] sent to Dr. Cathlean Cower. 11/01/13/ss.

## 2013-11-05 DIAGNOSIS — M1710 Unilateral primary osteoarthritis, unspecified knee: Secondary | ICD-10-CM | POA: Diagnosis not present

## 2013-11-05 DIAGNOSIS — M25569 Pain in unspecified knee: Secondary | ICD-10-CM | POA: Diagnosis not present

## 2013-11-11 DIAGNOSIS — D2330 Other benign neoplasm of skin of unspecified part of face: Secondary | ICD-10-CM | POA: Diagnosis not present

## 2013-11-11 DIAGNOSIS — Z85828 Personal history of other malignant neoplasm of skin: Secondary | ICD-10-CM | POA: Diagnosis not present

## 2013-11-26 ENCOUNTER — Ambulatory Visit (INDEPENDENT_AMBULATORY_CARE_PROVIDER_SITE_OTHER): Payer: Medicare Other | Admitting: Gastroenterology

## 2013-11-26 ENCOUNTER — Encounter: Payer: Self-pay | Admitting: Gastroenterology

## 2013-11-26 VITALS — BP 152/80 | HR 72 | Ht 65.0 in | Wt 144.4 lb

## 2013-11-26 DIAGNOSIS — R634 Abnormal weight loss: Secondary | ICD-10-CM

## 2013-11-26 DIAGNOSIS — R630 Anorexia: Secondary | ICD-10-CM

## 2013-11-26 NOTE — Patient Instructions (Addendum)
You will be set up for an upper endoscopy for your poor appetite, weight loss. Stay on protontix, one pill once daily for now. We will get records sent from your recent [facility] ER visit.  Labs, xray reports.

## 2013-11-26 NOTE — Progress Notes (Signed)
Review of gastrointestinal problems:  1. Acute diarrheal illness, likely infectious. Symptoms improved over about 4 weeks. October 2011. CBC, complete metabolic profile, celiac sprue testing, sedimentation rate all negative. Stool tests for usual infectious etiologies were all negative. Colonoscopy out of state in [place] 2-3 years prior was normal per patient. 11/12 this improved and never returned.  2. abdominal pain, burning : EGD November 2012 4 abdominal pain, burning found mild gastritis, H. pylori biopsies were negative. Cbc, lfts normal 10/12. CT scan 12.12 showed ventral hernia with fat only, otherwise normal. Started carafate 09/2011.   3. VEntral hernia repair 2014 , helped with post prandial pains, tender site.  HPI: This is a  very pleasant 73 year old woman whom I last saw a year or 2 ago.  Cbc, lfts normal 09/2013 [facility]   They spend  winters in [place].    Eating and laying down soon after (following spine surgery).  Abd, chest pains; went to ER in [place].  Never had pyrosis previouly.    Pantoprazole was started about 2 months ago.  No pyrosis on the medicine.  Takes it usually about an hour before BF meal.    No dysphagia.  Losing weigth without intension, poor appetite.  Avoiding certain foods.    Mild post prandial pains.   Past Medical History  Diagnosis Date  . HYPERLIPIDEMIA 02/09/2010  . HYPERTENSION 02/09/2010  . GERD 02/09/2010  . SKIN LESION 03/22/2010  . OCCIPITAL NEURALGIA 06/09/2010  . BACK PAIN, CHRONIC 02/09/2010  . OSTEOPENIA 02/09/2010  . FATIGUE 02/09/2010  . Diarrhea 04/26/2010  . COLONIC POLYPS, HX OF 02/09/2010  . Lumbar disc disease   . Complication of anesthesia   . PONV (postoperative nausea and vomiting)   . DEPRESSION 02/09/2010    pt. takes Cymbalta for pain issues   . COMMON MIGRAINE 02/09/2010    neuritis in R side of neck & head  . Neuromuscular disorder     neuritis in head & neck  . DEGENERATIVE JOINT DISEASE 02/09/2010    hands , lumbar  stenosis   . Varicose vein of leg     BLE  . Pneumonia     "as an infant" (05/15/2012)  . Anemia 1948?  Marland Kitchen Allergic rhinitis, cause unspecified 01/29/2013    Past Surgical History  Procedure Laterality Date  . Chymopapain injection  1986    Lumbar  . Lumbar laminectomy  1986  . Ovarian cyst removal  2006  . Hernia repair  05/15/2012    ventral incisional   . Cholecystectomy  ? 2009  . Varicose vein surgery  ~ 2008    bilaterally  . Breast biopsy  1988    left; Neg  . Tubal ligation  1970's  . Dilation and curettage of uterus  1960's  . Ventral hernia repair  05/15/2012    Procedure: LAPAROSCOPIC VENTRAL HERNIA;  Surgeon: Gayland Curry, MD,FACS;  Location: [facility];  Service: General;  Laterality: N/A;  laparoscopic ventral incisional hernia repair with mesh  . Colonoscopy    . Lumbar fusion  05/22/2013    L2  L3 decompression /fusion      Dr Lynann Bologna  . Transforaminal lumbar interbody fusion (tlif) with pedicle screw fixation 1 level Left 05/22/2013    Procedure: TRANSFORAMINAL LUMBAR INTERBODY FUSION (TLIF) WITH PEDICLE SCREW FIXATION 1 LEVEL;  Surgeon: Sinclair Ship, MD;  Location: [facility];  Service: Orthopedics;  Laterality: Left;  Left sided lumbar 2-3 Transforaminal lumbar interbody fusion with instrumentation, allograft  Current Outpatient Prescriptions  Medication Sig Dispense Refill  . acetaminophen (TYLENOL) 500 MG tablet Take 1,000 mg by mouth every 6 (six) hours as needed for pain.      . calcium gluconate 500 MG tablet Take 500 mg by mouth 2 (two) times daily.        . cholecalciferol (VITAMIN D) 400 UNITS TABS tablet Take 400 Units by mouth daily.      . Evening Primrose Oil 1000 MG CAPS Take 1,000 mg by mouth daily.      . hydrochlorothiazide (MICROZIDE) 12.5 MG capsule Take 1 capsule by mouth  daily after breakfast  90 capsule  2  . meclizine (ANTIVERT) 25 MG tablet Take 25 mg by mouth 3 (three) times daily as needed for dizziness.      . pantoprazole  (PROTONIX) 40 MG tablet Take 1 tablet (40 mg total) by mouth daily.  90 tablet  3  . simvastatin (ZOCOR) 20 MG tablet Take 1 tablet by mouth at   bedtime  90 tablet  1   No current facility-administered medications for this visit.    Allergies as of 11/26/2013 - Review Complete 11/26/2013  Allergen Reaction Noted  . Cefuroxime Other (See Comments)   . Cephalosporins Other (See Comments) 05/21/2013  . Citalopram Nausea Only 05/21/2013  . Escitalopram Nausea Only 07/26/2012  . Penicillins Hives 02/09/2010  . Sulfonamide derivatives Hives 02/09/2010  . Vancomycin Rash 05/15/2012    Family History  Problem Relation Age of Onset  . Lung cancer Mother   . Diabetes Father   . Breast cancer Maternal Grandmother   . Cancer Maternal Grandmother     breast  . Colon cancer Maternal Aunt     History   Social History  . Marital Status: Married    Spouse Name: N/A    Number of Children: N/A  . Years of Education: N/A   Occupational History  . Not on file.   Social History Main Topics  . Smoking status: Former Smoker -- 1.00 packs/day for 10 years    Types: Cigarettes    Quit date: 05/11/1971  . Smokeless tobacco: Never Used  . Alcohol Use: 1.8 oz/week    3 Glasses of wine per week     Comment: 05/15/2012 "wine 2-4 glasses/wk"  . Drug Use: No  . Sexual Activity: Not Currently   Other Topics Concern  . Not on file   Social History Narrative   Coffee daily       Physical Exam: BP 152/80  Pulse 72  Ht 5\' 5"  (1.651 m)  Wt 144 lb 6.4 oz (65.499 kg)  BMI 24.03 kg/m2 Constitutional: generally well-appearing Psychiatric: alert and oriented x3 Abdomen: soft, nontender, nondistended, no obvious ascites, no peritoneal signs, normal bowel sounds     Assessment and plan: 73 y.o. female with poor appetite, unexplained weight loss, episode in outside emergency room that was felt to be acid related  Not sure this is simply a problem with GERD. Am most struck by the fact that  her weight is down 10-15 pounds unintentionally hinged her appetite has decreased. None of her medicines are known to cause these side effects very frequently. I like to start her workup with an EGD, perhaps his underlying ulcer disease, less likely neoplasm, perhaps H. pylori infection. If EGD is done revealing then I will likely recommend CT scan given her weight loss, poor appetite. We will also work to get records from her [facility] ER visit, including any associated labs  and x-ray tests results.

## 2013-12-02 DIAGNOSIS — IMO0002 Reserved for concepts with insufficient information to code with codable children: Secondary | ICD-10-CM | POA: Diagnosis not present

## 2013-12-04 DIAGNOSIS — IMO0002 Reserved for concepts with insufficient information to code with codable children: Secondary | ICD-10-CM | POA: Diagnosis not present

## 2013-12-12 DIAGNOSIS — IMO0002 Reserved for concepts with insufficient information to code with codable children: Secondary | ICD-10-CM | POA: Diagnosis not present

## 2013-12-17 ENCOUNTER — Other Ambulatory Visit: Payer: Self-pay | Admitting: Internal Medicine

## 2013-12-23 ENCOUNTER — Encounter: Payer: Self-pay | Admitting: Gastroenterology

## 2013-12-23 ENCOUNTER — Ambulatory Visit (AMBULATORY_SURGERY_CENTER): Payer: Medicare Other | Admitting: Gastroenterology

## 2013-12-23 VITALS — BP 139/76 | HR 63 | Temp 97.8°F | Resp 12 | Ht 65.0 in | Wt 144.0 lb

## 2013-12-23 DIAGNOSIS — K2990 Gastroduodenitis, unspecified, without bleeding: Secondary | ICD-10-CM

## 2013-12-23 DIAGNOSIS — R634 Abnormal weight loss: Secondary | ICD-10-CM | POA: Diagnosis not present

## 2013-12-23 DIAGNOSIS — K319 Disease of stomach and duodenum, unspecified: Secondary | ICD-10-CM

## 2013-12-23 DIAGNOSIS — K2970 Gastritis, unspecified, without bleeding: Secondary | ICD-10-CM

## 2013-12-23 DIAGNOSIS — I10 Essential (primary) hypertension: Secondary | ICD-10-CM | POA: Diagnosis not present

## 2013-12-23 MED ORDER — SODIUM CHLORIDE 0.9 % IV SOLN: 500.0000 mL | INTRAVENOUS | Status: DC

## 2013-12-23 NOTE — Op Note (Signed)
[place]  [address]. [place], [address]   ENDOSCOPY PROCEDURE REPORT  PATIENT: Crystal, Herrera.  MR#: [id number] BIRTHDATE: 11-May-1941 , 72  yrs. old GENDER: Female ENDOSCOPIST: Milus Banister, MD REFERRED BY:  Cathlean Cower, M.D. PROCEDURE DATE:  12/23/2013 PROCEDURE:  EGD w/ biopsy ASA CLASS:     Class III INDICATIONS:  poor appetite, weight loss. MEDICATIONS: MAC sedation, administered by CRNA and Propofol (Diprivan) 120 mg IV TOPICAL ANESTHETIC: none  DESCRIPTION OF PROCEDURE: After the risks benefits and alternatives of the procedure were thoroughly explained, informed consent was obtained.  The LB [id number] endoscope was introduced through the mouth and advanced to the second portion of the duodenum. Without limitations.  The instrument was slowly withdrawn as the mucosa was fully examined.   There was mild, non-specific distal gastritis.  This was biopsied and sent to pathology.  The examination was otherwise normal. Retroflexed views revealed no abnormalities.     The scope was then withdrawn from the patient and the procedure completed. COMPLICATIONS: There were no complications.  ENDOSCOPIC IMPRESSION: There was mild, non-specific distal gastritis.  This was biopsied and sent to pathology.  The examination was otherwise normal.  RECOMMENDATIONS: If the biopsies show H.  pylori, you will be started on appropriate antibiotics.   eSigned:  Milus Banister, MD 12/23/2013 3:16 PM

## 2013-12-23 NOTE — Patient Instructions (Signed)
Gastritis seen today in stomach, handout given. Resume current medications. Call us with any questions or concerns. Thank you!!  YOU HAD AN ENDOSCOPIC PROCEDURE TODAY AT [facility]: Refer to the procedure report that was given to you for any specific questions about what was found during the examination.  If the procedure report does not answer your questions, please call your gastroenterologist to clarify.  If you requested that your care partner not be given the details of your procedure findings, then the procedure report has been included in a sealed envelope for you to review at your convenience later.  YOU SHOULD EXPECT: Some feelings of bloating in the abdomen. Passage of more gas than usual.  Walking can help get rid of the air that was put into your GI tract during the procedure and reduce the bloating. If you had a lower endoscopy (such as a colonoscopy or flexible sigmoidoscopy) you may notice spotting of blood in your stool or on the toilet paper. If you underwent a bowel prep for your procedure, then you may not have a normal bowel movement for a few days.  DIET: Your first meal following the procedure should be a light meal and then it is ok to progress to your normal diet.  A half-sandwich or bowl of soup is an example of a good first meal.  Heavy or fried foods are harder to digest and may make you feel nauseous or bloated.  Likewise meals heavy in dairy and vegetables can cause extra gas to form and this can also increase the bloating.  Drink plenty of fluids but you should avoid alcoholic beverages for 24 hours.  ACTIVITY: Your care partner should take you home directly after the procedure.  You should plan to take it easy, moving slowly for the rest of the day.  You can resume normal activity the day after the procedure however you should NOT DRIVE or use heavy machinery for 24 hours (because of the sedation medicines used during the test).    SYMPTOMS TO REPORT  IMMEDIATELY: A gastroenterologist can be reached at any hour.  During normal business hours, 8:30 AM to 5:00 PM Monday through Friday, call [phone].  After hours and on weekends, please call the GI answering service at [phone] who will take a message and have the physician on call contact you.   Following lower endoscopy (colonoscopy or flexible sigmoidoscopy):  Excessive amounts of blood in the stool  Significant tenderness or worsening of abdominal pains  Swelling of the abdomen that is new, acute  Fever of 100F or higher  Following upper endoscopy (EGD)  Vomiting of blood or coffee ground material  New chest pain or pain under the shoulder blades  Painful or persistently difficult swallowing  New shortness of breath  Fever of 100F or higher  Black, tarry-looking stools  FOLLOW UP: If any biopsies were taken you will be contacted by phone or by letter within the next 1-3 weeks.  Call your gastroenterologist if you have not heard about the biopsies in 3 weeks.  Our staff will call the home number listed on your records the next business day following your procedure to check on you and address any questions or concerns that you may have at that time regarding the information given to you following your procedure. This is a courtesy call and so if there is no answer at the home number and we have not heard from you through the emergency physician on call,  we will assume that you have returned to your regular daily activities without incident.  SIGNATURES/CONFIDENTIALITY: You and/or your care partner have signed paperwork which will be entered into your electronic medical record.  These signatures attest to the fact that that the information above on your After Visit Summary has been reviewed and is understood.  Full responsibility of the confidentiality of this discharge information lies with you and/or your care-partner.

## 2013-12-23 NOTE — Progress Notes (Signed)
Procedure ends, to recovery, report given and vss.

## 2013-12-23 NOTE — Progress Notes (Signed)
Called to room to assist during endoscopic procedure.  Patient ID and intended procedure confirmed with present staff. Received instructions for my participation in the procedure from the performing physician.  

## 2013-12-24 ENCOUNTER — Telehealth: Payer: Self-pay

## 2013-12-24 NOTE — Telephone Encounter (Signed)
  Follow up Call-  Call back number 12/23/2013 06/01/2011  Post procedure Call Back phone  # [phone]  Permission to leave phone message Yes -     Patient questions:  Do you have a fever, pain , or abdominal swelling? no Pain Score  0 *  Have you tolerated food without any problems? yes  Have you been able to return to your normal activities? yes  Do you have any questions about your discharge instructions: Diet   no Medications  no Follow up visit  no  Do you have questions or concerns about your Care? no  Actions: * If pain score is 4 or above: No action needed, pain <4.  No problems per the pt. Maw

## 2013-12-27 ENCOUNTER — Other Ambulatory Visit (INDEPENDENT_AMBULATORY_CARE_PROVIDER_SITE_OTHER): Payer: Medicare Other

## 2013-12-27 ENCOUNTER — Other Ambulatory Visit: Payer: Self-pay

## 2013-12-27 DIAGNOSIS — F5000 Anorexia nervosa, unspecified: Secondary | ICD-10-CM

## 2013-12-27 DIAGNOSIS — R634 Abnormal weight loss: Secondary | ICD-10-CM | POA: Diagnosis not present

## 2013-12-27 LAB — CREATININE, SERUM: Creatinine, Ser: 0.8 mg/dL (ref 0.4–1.2)

## 2013-12-27 LAB — BUN: BUN: 12 mg/dL (ref 6–23)

## 2013-12-27 NOTE — Progress Notes (Signed)
You have been scheduled for a CT scan of the abdomen and pelvis at [facility] ([address]---this is in the same building as [facility]).   You are scheduled on 01/01/14 at 130 pm. You should arrive 15 minutes prior to your appointment time for registration. Please follow the written instructions below on the day of your exam:  WARNING: IF YOU ARE ALLERGIC TO IODINE/X-RAY DYE, PLEASE NOTIFY RADIOLOGY IMMEDIATELY AT [phone]! YOU WILL BE GIVEN A 13 HOUR PREMEDICATION PREP.  1) Do not eat or drink anything after 930 am (4 hours prior to your test) 2) You have been given 2 bottles of oral contrast to drink. The solution may taste better if refrigerated, but do NOT add ice or any other liquid to this solution. Shake  well before drinking.    Drink 1 bottle of contrast @ 1130 am (2 hours prior to your exam)  Drink 1 bottle of contrast @ 1230 pm (1 hour prior to your exam)  You may take any medications as prescribed with a small amount of water except for the following: Metformin, Glucophage, Glucovance, Avandamet, Riomet, Fortamet, Actoplus Met, Janumet, Glumetza or Metaglip. The above medications must be held the day of the exam AND 48 hours after the exam.  The purpose of you drinking the oral contrast is to aid in the visualization of your intestinal tract. The contrast solution may cause some diarrhea. Before your exam is started, you will be given a small amount of fluid to drink. Depending on your individual set of symptoms, you may also receive an intravenous injection of x-ray contrast/dye. Plan on being at [facility] for 30 minutes or long, depending on the type of exam you are having performed.  This test typically takes 30-45 minutes to complete.  If you have any questions regarding your exam or if you need to reschedule, you may call the CT department at [phone] between the hours of 8:00 am and 5:00 pm,  Monday-Friday.  ________________________________________________________________________ Pt to get labs prior she is aware and will pick up contrast and have labs by next week

## 2013-12-31 ENCOUNTER — Telehealth: Payer: Self-pay

## 2013-12-31 NOTE — Telephone Encounter (Signed)
Message copied by Barron Alvine on Tue Dec 31, 2013  3:31 PM ------      Message from: Barron Alvine      Created: Fri Dec 27, 2013 10:12 AM       Pt to get labs  ------

## 2014-01-01 ENCOUNTER — Ambulatory Visit (INDEPENDENT_AMBULATORY_CARE_PROVIDER_SITE_OTHER)
Admission: RE | Admit: 2014-01-01 | Discharge: 2014-01-01 | Disposition: A | Discharge status: Home / Self Care | Payer: Medicare Other | Source: Ambulatory Visit | Attending: Gastroenterology | Admitting: Gastroenterology

## 2014-01-01 DIAGNOSIS — R197 Diarrhea, unspecified: Secondary | ICD-10-CM | POA: Diagnosis not present

## 2014-01-01 DIAGNOSIS — R634 Abnormal weight loss: Secondary | ICD-10-CM

## 2014-01-01 DIAGNOSIS — F5000 Anorexia nervosa, unspecified: Secondary | ICD-10-CM | POA: Diagnosis not present

## 2014-01-01 MED ORDER — IOHEXOL 300 MG/ML  SOLN
100.0000 mL | Freq: Once | INTRAMUSCULAR | Status: AC | PRN
  Administered 2014-01-01: 100 mL via INTRAVENOUS

## 2014-01-01 NOTE — Telephone Encounter (Signed)
Labs have been done.

## 2014-01-02 ENCOUNTER — Other Ambulatory Visit: Payer: Self-pay

## 2014-01-02 MED ORDER — PANTOPRAZOLE SODIUM 40 MG PO TBEC: 40.0000 mg | DELAYED_RELEASE_TABLET | Freq: Every day | ORAL | Status: DC

## 2014-01-02 NOTE — Telephone Encounter (Signed)
Requested refills of pantoprazole to be sent to [facility] in [place]

## 2014-01-06 ENCOUNTER — Telehealth: Payer: Self-pay | Admitting: Gastroenterology

## 2014-01-06 NOTE — Telephone Encounter (Signed)
Spoke with pt and discussed CT results. See result note.

## 2014-01-13 ENCOUNTER — Telehealth: Payer: Self-pay | Admitting: Gastroenterology

## 2014-01-13 NOTE — Telephone Encounter (Signed)
She is correct, PPIs can cause dyspepsia.  Would like her to try taking it only every other day for a week and then stop completely.  Offer ROV again, next available. She should at least call to report on her symptoms in 3-4 weeks.

## 2014-01-13 NOTE — Telephone Encounter (Signed)
Left message for pt to call back  °

## 2014-01-13 NOTE — Telephone Encounter (Signed)
Pt had EGD on 12/23/13 and was told she has gastritis. Pt states she has been taking protonix but she feels this is not helping. States that after she took the med this morning her stomach hurt. Pt wants to know if there is something else she could try that may not cause her discomfort. States she looked up Protonix on line and found that it did say could cause some abdominal discomfort. Dr. Ardis Hughs please advise.

## 2014-01-13 NOTE — Telephone Encounter (Signed)
Pt aware. Scheduled for OV with Dr. Ardis Hughs for 03/25/14@9 :45am. Pt aware of appt and knows to call in 3-4 weeks with update.

## 2014-01-14 ENCOUNTER — Other Ambulatory Visit: Payer: Self-pay | Admitting: Internal Medicine

## 2014-01-27 DIAGNOSIS — M545 Low back pain, unspecified: Secondary | ICD-10-CM | POA: Diagnosis not present

## 2014-01-27 DIAGNOSIS — M533 Sacrococcygeal disorders, not elsewhere classified: Secondary | ICD-10-CM | POA: Diagnosis not present

## 2014-01-31 ENCOUNTER — Telehealth: Payer: Self-pay | Admitting: Gastroenterology

## 2014-01-31 NOTE — Telephone Encounter (Signed)
The pt ate chocolate and has had burning since yesterday, she stopped taking all PPI because of stomach pain.  She was advised to take omeprazole OTC until she feels better and then she can taper off again if she feels she does not need it any longer.  She will keep her appt with Dr Ardis Hughs and call if she has no response

## 2014-02-11 ENCOUNTER — Telehealth: Payer: Self-pay | Admitting: Gastroenterology

## 2014-02-11 NOTE — Telephone Encounter (Signed)
The pt request to see an extender for abd pain, she wants to also keep the follow up with Ardis Hughs on 03/25/14

## 2014-02-18 ENCOUNTER — Ambulatory Visit: Payer: Medicare Other | Admitting: Gastroenterology

## 2014-02-19 ENCOUNTER — Encounter: Payer: Self-pay | Admitting: Gastroenterology

## 2014-02-19 ENCOUNTER — Ambulatory Visit (INDEPENDENT_AMBULATORY_CARE_PROVIDER_SITE_OTHER): Payer: Medicare Other | Admitting: Gastroenterology

## 2014-02-19 VITALS — BP 110/78 | HR 60 | Ht 65.0 in | Wt 142.0 lb

## 2014-02-19 DIAGNOSIS — R1013 Epigastric pain: Secondary | ICD-10-CM

## 2014-02-19 MED ORDER — OMEPRAZOLE 20 MG PO CPDR: 20.0000 mg | DELAYED_RELEASE_CAPSULE | Freq: Every day | ORAL | Status: DC

## 2014-02-19 NOTE — Patient Instructions (Addendum)
Restart peanut butter, dairy. Prescription for omeprazole written, 1 pill 20-30 min before a meal. If you prove not be lactose intolerant, call here for referral to vascular surgeon to consider mesenteric ischemia. Lactaid brand pills can help with lactose intolerance.

## 2014-02-19 NOTE — Progress Notes (Signed)
Review of gastrointestinal problems:  1. Acute diarrheal illness, likely infectious. Symptoms improved over about 4 weeks. October 2011. CBC, complete metabolic profile, celiac sprue testing, sedimentation rate all negative. Stool tests for usual infectious etiologies were all negative. Colonoscopy out of state in [place] 2-3 years prior was normal per patient. 11/12 this improved and never returned.  2. abdominal pain, burning : EGD November 2012 4 abdominal pain, burning found mild gastritis, H. pylori biopsies were negative. Cbc, lfts normal 10/12. CT scan 12.12 showed ventral hernia with fat only, otherwise normal. Started carafate 09/2011.  3. VEntral hernia repair 2014 , helped with post prandial pains, tender site. 4. Weight loss, anorexia: EGD 12/2013 Ardis Hughs found mild gastritis, biopsies showed no H. Pylori, follow up CT scan Abd with Iv contrast 12/2013 no clear etiology.  HPI: This is a   very pleasant 73 year old woman whom I last saw about a month and a half ago.  On scale here, she's lost 4 pounds in about 2 years.  She is pretty clear however that she has lost 10-15 pounds in the past 3 months, her clothes are fitting looser.  Last week she stopped peanut butter and dairy products.  Switched from protonix to omeprazole.  Pains were constant epigastric.  Since making the change in her diet and changing to omeprazole she has noticed a complete resolution of her symptoms.   Past Medical History  Diagnosis Date  . HYPERLIPIDEMIA 02/09/2010  . HYPERTENSION 02/09/2010  . GERD 02/09/2010  . SKIN LESION 03/22/2010  . OCCIPITAL NEURALGIA 06/09/2010  . BACK PAIN, CHRONIC 02/09/2010  . OSTEOPENIA 02/09/2010  . FATIGUE 02/09/2010  . Diarrhea 04/26/2010  . COLONIC POLYPS, HX OF 02/09/2010  . Lumbar disc disease   . Complication of anesthesia   . PONV (postoperative nausea and vomiting)   . DEPRESSION 02/09/2010    pt. takes Cymbalta for pain issues   . COMMON MIGRAINE 02/09/2010    neuritis in R  side of neck & head  . Neuromuscular disorder     neuritis in head & neck  . DEGENERATIVE JOINT DISEASE 02/09/2010    hands , lumbar stenosis   . Varicose vein of leg     BLE  . Pneumonia     "as an infant" (05/15/2012)  . Anemia 1948?  Marland Kitchen Allergic rhinitis, cause unspecified 01/29/2013  . Cancer     SKIN  . Cataract     Past Surgical History  Procedure Laterality Date  . Chymopapain injection  1986    Lumbar  . Lumbar laminectomy  1986  . Ovarian cyst removal  2006  . Hernia repair  05/15/2012    ventral incisional   . Cholecystectomy  ? 2009  . Varicose vein surgery  ~ 2008    bilaterally  . Breast biopsy  1988    left; Neg  . Tubal ligation  1970's  . Dilation and curettage of uterus  1960's  . Ventral hernia repair  05/15/2012    Procedure: LAPAROSCOPIC VENTRAL HERNIA;  Surgeon: Gayland Curry, MD,FACS;  Location: [facility];  Service: General;  Laterality: N/A;  laparoscopic ventral incisional hernia repair with mesh  . Colonoscopy    . Lumbar fusion  05/22/2013    L2  L3 decompression /fusion      Dr Lynann Bologna  . Transforaminal lumbar interbody fusion (tlif) with pedicle screw fixation 1 level Left 05/22/2013    Procedure: TRANSFORAMINAL LUMBAR INTERBODY FUSION (TLIF) WITH PEDICLE SCREW FIXATION 1 LEVEL;  Surgeon:  Sinclair Ship, MD;  Location: [facility];  Service: Orthopedics;  Laterality: Left;  Left sided lumbar 2-3 Transforaminal lumbar interbody fusion with instrumentation, allograft    Current Outpatient Prescriptions  Medication Sig Dispense Refill  . acetaminophen (TYLENOL) 500 MG tablet Take 1,000 mg by mouth every 6 (six) hours as needed for pain.      . calcium gluconate 500 MG tablet Take 500 mg by mouth 2 (two) times daily.        . cholecalciferol (VITAMIN D) 400 UNITS TABS tablet Take 400 Units by mouth daily.      . hydrochlorothiazide (MICROZIDE) 12.5 MG capsule Take 1 capsule by mouth   daily after breakfast  90 capsule  3  . meclizine (ANTIVERT) 25 MG  tablet Take 25 mg by mouth 3 (three) times daily as needed for dizziness.      . simvastatin (ZOCOR) 20 MG tablet Take 1 tablet by mouth at  bedtime  90 tablet  3   No current facility-administered medications for this visit.    Allergies as of 02/19/2014 - Review Complete 02/19/2014  Allergen Reaction Noted  . Cefuroxime Other (See Comments)   . Cephalosporins Other (See Comments) 05/21/2013  . Citalopram Nausea Only 05/21/2013  . Escitalopram Nausea Only 07/26/2012  . Penicillins Hives 02/09/2010  . Sulfonamide derivatives Hives 02/09/2010  . Vancomycin Rash 05/15/2012    Family History  Problem Relation Age of Onset  . Lung cancer Mother   . Diabetes Father   . Breast cancer Maternal Grandmother   . Cancer Maternal Grandmother     breast  . Colon cancer Maternal Aunt     History   Social History  . Marital Status: Married    Spouse Name: N/A    Number of Children: N/A  . Years of Education: N/A   Occupational History  . Not on file.   Social History Main Topics  . Smoking status: Former Smoker -- 1.00 packs/day for 10 years    Types: Cigarettes    Quit date: 05/11/1971  . Smokeless tobacco: Never Used  . Alcohol Use: 1.8 oz/week    3 Glasses of wine per week     Comment: 05/15/2012 "wine 2-4 glasses/wk"  . Drug Use: No  . Sexual Activity: Not Currently   Other Topics Concern  . Not on file   Social History Narrative   Coffee daily       Physical Exam: BP 110/78  Pulse 60  Ht 5\' 5"  (1.651 m)  Wt 142 lb (64.411 kg)  BMI 23.63 kg/m2 Constitutional: generally well-appearing Psychiatric: alert and oriented x3 Abdomen: soft, nontender, nondistended, no obvious ascites, no peritoneal signs, normal bowel sounds     Assessment and plan: 73 y.o. female with epigastric abdominal pain, weight loss  Perhaps this is due to gastritis which is being better treated with her omeprazole but she switched to, perhaps she has lactose intolerance. She is clearly  better since making the to changes described above. She will retry dairy to see if it causes her symptoms again in order to firm up the diagnosis of lactose intolerance. She will call here to report on her progress in 2-3 weeks. My next step if she is not feeling better would be to refer her to vascular surgery to consider mesenteric ischemia given the aortoiliac atherosclerosis described on her CT scan, perhaps she has mild chronic mesenteric ischemia

## 2014-03-06 ENCOUNTER — Telehealth: Payer: Self-pay

## 2014-03-06 NOTE — Telephone Encounter (Signed)
Pharmacy informed.

## 2014-03-06 NOTE — Telephone Encounter (Signed)
[facility] requesting a refill on [place],not on current list.

## 2014-03-06 NOTE — Telephone Encounter (Signed)
Ok to hold off for now, thanks 

## 2014-03-07 ENCOUNTER — Telehealth: Payer: Self-pay | Admitting: Gastroenterology

## 2014-03-07 DIAGNOSIS — K551 Chronic vascular disorders of intestine: Secondary | ICD-10-CM

## 2014-03-07 NOTE — Telephone Encounter (Signed)
Still having pain and would like referral Dr Ardis Hughs mentioned at last office visit (referral to vascular surgeon to consider mesenteric ischemia) Referral has been added to  [facility] [phone] 03/12/14 815 arrival with Dr Doren Custard pt is aware and was given address and phone number

## 2014-03-10 ENCOUNTER — Encounter: Payer: Self-pay | Admitting: *Deleted

## 2014-03-11 ENCOUNTER — Encounter: Payer: Self-pay | Admitting: Vascular Surgery

## 2014-03-12 ENCOUNTER — Encounter: Payer: Self-pay | Admitting: Vascular Surgery

## 2014-03-12 ENCOUNTER — Ambulatory Visit (INDEPENDENT_AMBULATORY_CARE_PROVIDER_SITE_OTHER): Payer: Medicare Other | Admitting: Vascular Surgery

## 2014-03-12 VITALS — BP 135/78 | HR 70 | Resp 16 | Ht 65.0 in | Wt 143.0 lb

## 2014-03-12 DIAGNOSIS — K551 Chronic vascular disorders of intestine: Secondary | ICD-10-CM | POA: Diagnosis not present

## 2014-03-12 NOTE — Progress Notes (Signed)
Patient ID: Crystal Herrera, female   DOB: 05/15/1941, 73 y.o.   MRN: [id number]  Reason for Consult: Possible chronic mesenteric ischemia   Referred by Biagio Borg, MD  Subjective:     HPI:  Crystal Herrera is a 73 y.o. female who has had some epigastric pain for approximately 3-4 months. She has undergone an extensive workup. This included a CT scan which showed evidence of aortoiliac occlusive disease. For this reason she was referred for evaluation for possible chronic mesenteric ischemia.   She had some epigastric pain a couple years ago and ultimately was found to have a ventral hernia which was repaired. She did well after that. 3-4 months ago she began having some more epigastric pain which was fairly constant. She denies postprandial abdominal pain. There are really no aggravating or alleviating factors. She was noted to have some evidence of reflux. She does note an approximately 10 pound weight loss over the last year. She has not had as much of an appetite as normal. Of note over the last 4-5 days she has not had any abdominal pain.  Her risk factors for vascular disease include hypertension and hypercholesterolemia. She does have a remote history of tobacco use. She denies any history of diabetes or history of premature cardiovascular disease.  Past Medical History  Diagnosis Date  . HYPERLIPIDEMIA 02/09/2010  . HYPERTENSION 02/09/2010  . GERD 02/09/2010  . SKIN LESION 03/22/2010  . OCCIPITAL NEURALGIA 06/09/2010  . BACK PAIN, CHRONIC 02/09/2010  . OSTEOPENIA 02/09/2010  . FATIGUE 02/09/2010  . Diarrhea 04/26/2010  . COLONIC POLYPS, HX OF 02/09/2010  . Lumbar disc disease   . Complication of anesthesia   . PONV (postoperative nausea and vomiting)   . DEPRESSION 02/09/2010    pt. takes Cymbalta for pain issues   . COMMON MIGRAINE 02/09/2010    neuritis in R side of neck & head  . Neuromuscular disorder     neuritis in head & neck  . DEGENERATIVE JOINT DISEASE 02/09/2010     hands , lumbar stenosis   . Varicose vein of leg     BLE  . Pneumonia     "as an infant" (05/15/2012)  . Anemia 1948?  Marland Kitchen Allergic rhinitis, cause unspecified 01/29/2013  . Cancer     SKIN  . Cataract    Family History  Problem Relation Age of Onset  . Lung cancer Mother   . Cancer Mother     Lung  . COPD Mother   . Varicose Veins Mother   . Diabetes Father   . Breast cancer Maternal Grandmother   . Cancer Maternal Grandmother     breast  . Colon cancer Maternal Aunt   . Cancer Son     Melanoma   Past Surgical History  Procedure Laterality Date  . Chymopapain injection  1986    Lumbar  . Lumbar laminectomy  1986  . Ovarian cyst removal  2006  . Hernia repair  05/15/2012    ventral incisional   . Cholecystectomy  ? 2009  . Varicose vein surgery  ~ 2008    bilaterally  . Breast biopsy  1988    left; Neg  . Tubal ligation  1970's  . Dilation and curettage of uterus  1960's  . Ventral hernia repair  05/15/2012    Procedure: LAPAROSCOPIC VENTRAL HERNIA;  Surgeon: Gayland Curry, MD,FACS;  Location: [place];  Service: General;  Laterality: N/A;  laparoscopic ventral incisional hernia repair with  mesh  . Colonoscopy    . Lumbar fusion  05/22/2013    L2  L3 decompression /fusion      Dr Lynann Bologna  . Transforaminal lumbar interbody fusion (tlif) with pedicle screw fixation 1 level Left 05/22/2013    Procedure: TRANSFORAMINAL LUMBAR INTERBODY FUSION (TLIF) WITH PEDICLE SCREW FIXATION 1 LEVEL;  Surgeon: Sinclair Ship, MD;  Location: [place];  Service: Orthopedics;  Laterality: Left;  Left sided lumbar 2-3 Transforaminal lumbar interbody fusion with instrumentation, allograft   Short Social History:  History  Substance Use Topics  . Smoking status: Former Smoker -- 1.00 packs/day for 10 years    Types: Cigarettes    Quit date: 05/11/1971  . Smokeless tobacco: Never Used  . Alcohol Use: 1.8 oz/week    3 Glasses of wine per week     Comment: 05/15/2012 "wine 2-4  glasses/wk"   Allergies  Allergen Reactions  . Cefuroxime Other (See Comments)    Unknown    . Cephalosporins Other (See Comments)    Unknown   . Citalopram Nausea Only  . Escitalopram Nausea Only  . Penicillins Hives  . Sulfonamide Derivatives Hives  . Vancomycin Rash    rash   Current Outpatient Prescriptions  Medication Sig Dispense Refill  . acetaminophen (TYLENOL) 500 MG tablet Take 1,000 mg by mouth every 6 (six) hours as needed for pain.      . calcium gluconate 500 MG tablet Take 500 mg by mouth 2 (two) times daily.        . carisoprodol (SOMA) 350 MG tablet Take 350 mg by mouth at bedtime.      . cholecalciferol (VITAMIN D) 400 UNITS TABS tablet Take 400 Units by mouth daily.      . diclofenac sodium (VOLTAREN) 1 % GEL Apply topically 4 (four) times daily.      . hydrochlorothiazide (MICROZIDE) 12.5 MG capsule Take 1 capsule by mouth   daily after breakfast  90 capsule  3  . meclizine (ANTIVERT) 25 MG tablet Take 25 mg by mouth 3 (three) times daily as needed for dizziness.      Marland Kitchen omeprazole (PRILOSEC) 20 MG capsule Take 1 capsule (20 mg total) by mouth daily.  90 capsule  3  . simvastatin (ZOCOR) 20 MG tablet Take 1 tablet by mouth at  bedtime  90 tablet  3  . pantoprazole (PROTONIX) 40 MG tablet daily.       No current facility-administered medications for this visit.   Review of Systems  Constitutional: Negative for chills and fever.  Eyes: Negative for loss of vision.  Respiratory: Negative for cough and wheezing.  Cardiovascular: Negative for chest pain, chest tightness, claudication, dyspnea with exertion, orthopnea and palpitations.  GI: Positive for abdominal pain. Negative for blood in stool and vomiting.  GU: Negative for dysuria and hematuria.  Musculoskeletal: Negative for leg pain, joint pain and myalgias.  Skin: Negative for rash and wound.  Neurological: Negative for dizziness and speech difficulty.  Hematologic: Negative for bruises/bleeds  easily. Psychiatric: Negative for depressed mood.       Objective:  Objective  Filed Vitals:   03/12/14 1256  BP: 135/78  Pulse: 70  Resp: 16  Height: 5\' 5"  (1.651 m)  Weight: 143 lb (64.864 kg)  SpO2: 100%   Body mass index is 23.8 kg/(m^2).  Physical Exam  Constitutional: She is oriented to person, place, and time. She appears well-developed and well-nourished.  HENT:  Head: Normocephalic and atraumatic.  Neck: Neck supple.  No JVD present. No thyromegaly present.  Cardiovascular: Normal rate, regular rhythm and normal heart sounds.  Exam reveals no friction rub.   No murmur heard. Pulses:      Femoral pulses are 2+ on the right side, and 2+ on the left side.      Popliteal pulses are 2+ on the right side, and 2+ on the left side.       Posterior tibial pulses are 2+ on the right side, and 2+ on the left side.  Pulmonary/Chest: Breath sounds normal. She has no wheezes. She has no rales.  Abdominal: Soft. Bowel sounds are normal. There is no tenderness.  I do not palpate an aneurysm. I do not appreciate an abdominal bruit.  Musculoskeletal: Normal range of motion. She exhibits no edema.  Lymphadenopathy:    She has no cervical adenopathy.  Neurological: She is alert and oriented to person, place, and time. She has normal strength. No sensory deficit.  Skin: No lesion and no rash noted.  Psychiatric: She has a normal mood and affect.   Data: I have reviewed her office note from Dr. Ardis Hughs office. On 02/19/2014 and was felt that she likely had an acute diarrheal illness which was likely infectious. Workup was in progress. He also noted some weight loss and an EGD had shown mild gastritis. CT scan showed evidence of aortoiliac occlusive disease.  I have reviewed her CT scan. The coronal views clearly demonstrate the origin of the superior mesenteric artery and celiac axis and these both are widely patent without evidence of occlusive disease. The inferior mesenteric artery is  not well visualized.      Assessment/Plan:    ABDOMINAL PAIN: I do not think the patient has evidence of chronic mesenteric ischemia. She has no postprandial component to her abdominal pain. In addition the CT scan shows that the celiac axis and superior mesenteric artery are widely patent without evidence of significant atherosclerosis. Likewise both hypogastric arteries are patent. Perhaps she was having some pain related to her previous ventral hernia repair although she she has no evidence of bowel obstruction. However I reassured her that I feel confident she has no evidence of chronic mesenteric ischemia. I will be happy to see her back at any time if any new vascular issues arise.   Angelia Mould MD [facility]

## 2014-03-25 ENCOUNTER — Ambulatory Visit: Payer: Medicare Other | Admitting: Gastroenterology

## 2014-03-27 ENCOUNTER — Telehealth: Payer: Self-pay | Admitting: Internal Medicine

## 2014-03-27 DIAGNOSIS — M545 Low back pain, unspecified: Secondary | ICD-10-CM | POA: Diagnosis not present

## 2014-03-27 DIAGNOSIS — K648 Other hemorrhoids: Secondary | ICD-10-CM

## 2014-03-27 NOTE — Telephone Encounter (Signed)
Would she be ok with referral to GI - dr Carlean Purl, but may about 2 mo to get to see him, as he has special training in this area  O/w we could refer Gen Surgury as she suggests

## 2014-03-27 NOTE — Telephone Encounter (Signed)
Informed the patient of options.  She stated to refer her where she can be seen sooner as she is quite uncomfortable.

## 2014-03-27 NOTE — Telephone Encounter (Signed)
Referral done - gen surgury

## 2014-03-27 NOTE — Telephone Encounter (Signed)
Pt request referral to general surgeon for hemrrhoid issue. Please advise, pt is very concern about this.

## 2014-04-04 DIAGNOSIS — IMO0002 Reserved for concepts with insufficient information to code with codable children: Secondary | ICD-10-CM | POA: Diagnosis not present

## 2014-04-04 NOTE — Telephone Encounter (Signed)
Would like to be referred to someone who will be able to help her issue.  Patient would like a call back in regards.

## 2014-04-04 NOTE — Telephone Encounter (Signed)
Called the patient and informed her per PCP instructions to schedule for Saturday clinic.  The patient agreed to do so.

## 2014-04-05 ENCOUNTER — Encounter: Payer: Self-pay | Admitting: Family Medicine

## 2014-04-05 ENCOUNTER — Ambulatory Visit (INDEPENDENT_AMBULATORY_CARE_PROVIDER_SITE_OTHER): Payer: Medicare Other | Admitting: Family Medicine

## 2014-04-05 VITALS — BP 122/78 | HR 65 | Temp 97.8°F | Resp 16 | Wt 142.5 lb

## 2014-04-05 DIAGNOSIS — K649 Unspecified hemorrhoids: Secondary | ICD-10-CM | POA: Diagnosis not present

## 2014-04-05 MED ORDER — HYDROCORTISONE 2.5 % RE CREA: 1.0000 "application " | TOPICAL_CREAM | Freq: Two times a day (BID) | RECTAL | Status: DC

## 2014-04-05 NOTE — Progress Notes (Addendum)
Chief Complaint  Patient presents with  . Hemorrhoids    7-10 days    HPI:  Acute visit for:  1) Hemorrhoids: -chronic, long hx of hemorrhoids with flare the last week or so  -referred to general surgeon per her request 9/3 - but she reports never heard back about this, reports thinks has appt with Dr. Redmond Pulling ([place]) on Wednesday whom she sees for hernia -has gastroenterologist  (Dr. Ardis Hughs) -afraid to do sitz bath because gets UTI, tried suppository a few times, still having some rectal pain -denies: bleeding, straining, constipation, diarrhea, change in bowels, abd pain -sees PCP in a few days  ROS: See pertinent positives and negatives per HPI.  Past Medical History  Diagnosis Date  . HYPERLIPIDEMIA 02/09/2010  . HYPERTENSION 02/09/2010  . GERD 02/09/2010  . SKIN LESION 03/22/2010  . OCCIPITAL NEURALGIA 06/09/2010  . BACK PAIN, CHRONIC 02/09/2010  . OSTEOPENIA 02/09/2010  . FATIGUE 02/09/2010  . Diarrhea 04/26/2010  . COLONIC POLYPS, HX OF 02/09/2010  . Lumbar disc disease   . Complication of anesthesia   . PONV (postoperative nausea and vomiting)   . DEPRESSION 02/09/2010    pt. takes Cymbalta for pain issues   . COMMON MIGRAINE 02/09/2010    neuritis in R side of neck & head  . Neuromuscular disorder     neuritis in head & neck  . DEGENERATIVE JOINT DISEASE 02/09/2010    hands , lumbar stenosis   . Varicose vein of leg     BLE  . Pneumonia     "as an infant" (05/15/2012)  . Anemia 1948?  Marland Kitchen Allergic rhinitis, cause unspecified 01/29/2013  . Cancer     SKIN  . Cataract     Past Surgical History  Procedure Laterality Date  . Chymopapain injection  1986    Lumbar  . Lumbar laminectomy  1986  . Ovarian cyst removal  2006  . Hernia repair  05/15/2012    ventral incisional   . Cholecystectomy  ? 2009  . Varicose vein surgery  ~ 2008    bilaterally  . Breast biopsy  1988    left; Neg  . Tubal ligation  1970's  . Dilation and curettage of uterus  1960's  . Ventral  hernia repair  05/15/2012    Procedure: LAPAROSCOPIC VENTRAL HERNIA;  Surgeon: Gayland Curry, MD,FACS;  Location: [place];  Service: General;  Laterality: N/A;  laparoscopic ventral incisional hernia repair with mesh  . Colonoscopy    . Lumbar fusion  05/22/2013    L2  L3 decompression /fusion      Dr Lynann Bologna  . Transforaminal lumbar interbody fusion (tlif) with pedicle screw fixation 1 level Left 05/22/2013    Procedure: TRANSFORAMINAL LUMBAR INTERBODY FUSION (TLIF) WITH PEDICLE SCREW FIXATION 1 LEVEL;  Surgeon: Sinclair Ship, MD;  Location: [place];  Service: Orthopedics;  Laterality: Left;  Left sided lumbar 2-3 Transforaminal lumbar interbody fusion with instrumentation, allograft    Family History  Problem Relation Age of Onset  . Lung cancer Mother   . Cancer Mother     Lung  . COPD Mother   . Varicose Veins Mother   . Diabetes Father   . Breast cancer Maternal Grandmother   . Cancer Maternal Grandmother     breast  . Colon cancer Maternal Aunt   . Cancer Son     Melanoma    History   Social History  . Marital Status: Married    Spouse Name: N/A  Number of Children: N/A  . Years of Education: N/A   Social History Main Topics  . Smoking status: Former Smoker -- 1.00 packs/day for 10 years    Types: Cigarettes    Quit date: 05/11/1971  . Smokeless tobacco: Never Used  . Alcohol Use: 1.8 oz/week    3 Glasses of wine per week     Comment: 05/15/2012 "wine 2-4 glasses/wk"  . Drug Use: No  . Sexual Activity: Not Currently   Other Topics Concern  . None   Social History Narrative   Coffee daily     Current outpatient prescriptions:acetaminophen (TYLENOL) 500 MG tablet, Take 1,000 mg by mouth every 6 (six) hours as needed for pain., Disp: , Rfl: ;  calcium gluconate 500 MG tablet, Take 500 mg by mouth 2 (two) times daily.  , Disp: , Rfl: ;  carisoprodol (SOMA) 350 MG tablet, Take 350 mg by mouth at bedtime., Disp: , Rfl: ;  cholecalciferol (VITAMIN D) 400 UNITS  TABS tablet, Take 400 Units by mouth daily., Disp: , Rfl:  diclofenac sodium (VOLTAREN) 1 % GEL, Apply topically 4 (four) times daily., Disp: , Rfl: ;  hydrochlorothiazide (MICROZIDE) 12.5 MG capsule, Take 1 capsule by mouth   daily after breakfast, Disp: 90 capsule, Rfl: 3;  meclizine (ANTIVERT) 25 MG tablet, Take 25 mg by mouth 3 (three) times daily as needed for dizziness., Disp: , Rfl: ;  omeprazole (PRILOSEC) 20 MG capsule, Take 1 capsule (20 mg total) by mouth daily., Disp: 90 capsule, Rfl: 3 simvastatin (ZOCOR) 20 MG tablet, Take 1 tablet by mouth at  bedtime, Disp: 90 tablet, Rfl: 3;  hydrocortisone (PROCTOSOL HC) 2.5 % rectal cream, Place 1 application rectally 2 (two) times daily., Disp: 30 g, Rfl: 0;  pantoprazole (PROTONIX) 40 MG tablet, daily., Disp: , Rfl:   EXAM:  Filed Vitals:   04/05/14 0918  BP: 122/78  Pulse: 65  Temp: 97.8 F (36.6 C)  Resp: 16    Body mass index is 23.71 kg/(m^2).  GENERAL: vitals reviewed and listed above, alert, oriented, appears well hydrated and in no acute distress  HEENT: atraumatic, conjunttiva clear, no obvious abnormalities on inspection of external nose and ears  NECK: no obvious masses on inspection  RECTAL: no fissure or ext hemorrhoid on visual inspection, ? small soft internal hemorrhoid on gentle DRE, no anoscope available  MS: moves all extremities without noticeable abnormality  PSYCH: pleasant and cooperative, no obvious depression or anxiety  ASSESSMENT AND PLAN:  Discussed the following assessment and plan:  Hemorrhoids, unspecified hemorrhoid type - Plan: hydrocortisone (PROCTOSOL HC) 2.5 % rectal cream  -Patient advised to return or notify a doctor immediately if symptoms worsen or persist or new concerns arise.  Patient Instructions  -try the new cream  -call you general surgeon for evaluation if persists  -follow up with PCP as scheduled     Areil Ottey R.

## 2014-04-05 NOTE — Patient Instructions (Addendum)
-  try the new cream  -call your general surgeon or gastroenterologist for evaluation if persists  -follow up with PCP as scheduled

## 2014-04-05 NOTE — Progress Notes (Signed)
Pre visit review using our clinic review tool, if applicable. No additional management support is needed unless otherwise documented below in the visit note. 

## 2014-04-08 ENCOUNTER — Encounter: Payer: Self-pay | Admitting: Internal Medicine

## 2014-04-08 ENCOUNTER — Other Ambulatory Visit (INDEPENDENT_AMBULATORY_CARE_PROVIDER_SITE_OTHER): Payer: Medicare Other

## 2014-04-08 ENCOUNTER — Ambulatory Visit (INDEPENDENT_AMBULATORY_CARE_PROVIDER_SITE_OTHER): Payer: Medicare Other | Admitting: Internal Medicine

## 2014-04-08 VITALS — BP 122/82 | HR 79 | Temp 98.2°F | Ht 65.0 in | Wt 143.5 lb

## 2014-04-08 DIAGNOSIS — E785 Hyperlipidemia, unspecified: Secondary | ICD-10-CM | POA: Diagnosis not present

## 2014-04-08 DIAGNOSIS — F3289 Other specified depressive episodes: Secondary | ICD-10-CM | POA: Diagnosis not present

## 2014-04-08 DIAGNOSIS — Z23 Encounter for immunization: Secondary | ICD-10-CM

## 2014-04-08 DIAGNOSIS — I10 Essential (primary) hypertension: Secondary | ICD-10-CM

## 2014-04-08 DIAGNOSIS — F329 Major depressive disorder, single episode, unspecified: Secondary | ICD-10-CM

## 2014-04-08 LAB — URINALYSIS, ROUTINE W REFLEX MICROSCOPIC
Bilirubin Urine: NEGATIVE
Hgb urine dipstick: NEGATIVE
Ketones, ur: NEGATIVE
Leukocytes, UA: NEGATIVE
Nitrite: NEGATIVE
RBC / HPF: NONE SEEN (ref 0–?)
Specific Gravity, Urine: 1.01 (ref 1.000–1.030)
Total Protein, Urine: NEGATIVE
Urine Glucose: NEGATIVE
Urobilinogen, UA: 0.2 (ref 0.0–1.0)
WBC, UA: NONE SEEN (ref 0–?)
pH: 6.5 (ref 5.0–8.0)

## 2014-04-08 LAB — LIPID PANEL
Cholesterol: 173 mg/dL (ref 0–200)
HDL: 49.8 mg/dL (ref >39.00)
LDL Cholesterol: 89 mg/dL (ref 0–99)
NonHDL: 123.2
Total CHOL/HDL Ratio: 3
Triglycerides: 170 mg/dL — ABNORMAL HIGH (ref 0.0–149.0)
VLDL: 34 mg/dL (ref 0.0–40.0)

## 2014-04-08 LAB — HEPATIC FUNCTION PANEL
ALT: 17 U/L (ref 0–35)
AST: 18 U/L (ref 0–37)
Albumin: 4.3 g/dL (ref 3.5–5.2)
Alkaline Phosphatase: 52 U/L (ref 39–117)
Bilirubin, Direct: 0.1 mg/dL (ref 0.0–0.3)
Total Bilirubin: 0.4 mg/dL (ref 0.2–1.2)
Total Protein: 7 g/dL (ref 6.0–8.3)

## 2014-04-08 LAB — TSH: TSH: 1.43 u[IU]/mL (ref 0.35–4.50)

## 2014-04-08 LAB — BASIC METABOLIC PANEL
BUN: 14 mg/dL (ref 6–23)
CO2: 30 mEq/L (ref 19–32)
Calcium: 9.8 mg/dL (ref 8.4–10.5)
Chloride: 100 mEq/L (ref 96–112)
Creatinine, Ser: 0.7 mg/dL (ref 0.4–1.2)
GFR: 85.53 mL/min (ref >60.00)
Glucose, Bld: 79 mg/dL (ref 70–99)
Potassium: 3.8 mEq/L (ref 3.5–5.1)
Sodium: 139 mEq/L (ref 135–145)

## 2014-04-08 MED ORDER — DICLOFENAC SODIUM 1 % TD GEL: 4.0000 g | Freq: Four times a day (QID) | TRANSDERMAL | Status: DC

## 2014-04-08 MED ORDER — CARISOPRODOL 350 MG PO TABS: 350.0000 mg | ORAL_TABLET | Freq: Every day | ORAL | Status: DC

## 2014-04-08 NOTE — Progress Notes (Signed)
Pre visit review using our clinic review tool, if applicable. No additional management support is needed unless otherwise documented below in the visit note. 

## 2014-04-08 NOTE — Assessment & Plan Note (Signed)
stable overall by history and exam, recent data reviewed with pt, and pt to continue medical treatment as before,  to f/u any worsening symptoms or concerns Lab Results  Component Value Date   LDLCALC 58 05/24/2011   For f/u labs

## 2014-04-08 NOTE — Assessment & Plan Note (Signed)
Mild recurrent, declines any change in tx at this time, verified no SI

## 2014-04-08 NOTE — Progress Notes (Signed)
Subjective:    Patient ID: Crystal Herrera, female    DOB: [birth year], 73 y.o.   MRN: [id number]    HPI  Here for yearly f/u;  Overall doing ok;  Pt denies CP, worsening SOB, DOE, wheezing, orthopnea, PND, worsening LE edema, palpitations, dizziness or syncope.  Pt denies neurological change such as new headache, facial or extremity weakness.  Pt denies polydipsia, polyuria, or low sugar symptoms. Pt states overall good compliance with treatment and medications, good tolerability, and has been trying to follow lower cholesterol diet.  Pt denies worsening depressive symptoms, suicidal ideation or panic. No fever, night sweats, wt loss, loss of appetite, or other constitutional symptoms.  Pt states good ability with ADL's, has low fall risk, home safety reviewed and adequate, no other significant changes in hearing or vision, and only occasionally active with exercise.  Ds/p lumbar fusion l2-26 Apr 2013, surgury went well, but unfort pain recurred to LLE, saw Dr Laure Kidney - films neg, but dx with permanent nerve damage, referred to Dr Jacelyn Grip in same office for [facility] aprox end of May, worked [facility], but now recurring so likely for next again in Oct 2015.  Denies ongoing reflux with flare of pain witheating and lying down, seen in [place] in [place] , no other abd pain, dysphagia, n/v, bowel change or blood. But later with more abd pain, saw GI with EGD, CT, and omeprazole, pain did not seem to improved, but eventually resolved. Lost 18 lbs as she felt she could not eat well.  Wt Readings from Last 3 Encounters:  04/08/14 143 lb 8 oz (65.091 kg)  04/05/14 142 lb 8 oz (64.638 kg)  03/12/14 143 lb (64.864 kg)   Wt recently has been more stable.  Monitors her anti-reflux diet carefully, and avoiding salt, so overall improved now.  Then 2 wks ago with recent hemorrhoid pain better with cream per Dr Maudie Mercury, seen per MD here. Has incidentally appt with DrWilson/gen surg tomorrow and plans to address as well.   Also had  tailbone pain for which she saw MD as well, Sits on soft pillows, now on her 4th pillow.  Did have reaction to flu shot from [facility], had marked shakes within a few hrs of the shot, seen at ER, etiology not clear, told ok to get the shot. Has had mild worsening depressive symptomsl likely situation, but no suicidal ideation, or panic.   Past Medical History  Diagnosis Date  . HYPERLIPIDEMIA 02/09/2010  . HYPERTENSION 02/09/2010  . GERD 02/09/2010  . SKIN LESION 03/22/2010  . OCCIPITAL NEURALGIA 06/09/2010  . BACK PAIN, CHRONIC 02/09/2010  . OSTEOPENIA 02/09/2010  . FATIGUE 02/09/2010  . Diarrhea 04/26/2010  . COLONIC POLYPS, HX OF 02/09/2010  . Lumbar disc disease   . Complication of anesthesia   . PONV (postoperative nausea and vomiting)   . DEPRESSION 02/09/2010    pt. takes Cymbalta for pain issues   . COMMON MIGRAINE 02/09/2010    neuritis in R side of neck & head  . Neuromuscular disorder     neuritis in head & neck  . DEGENERATIVE JOINT DISEASE 02/09/2010    hands , lumbar stenosis   . Varicose vein of leg     BLE  . Pneumonia     "as an infant" (05/15/2012)  . Anemia 1948?  Marland Kitchen Allergic rhinitis, cause unspecified 01/29/2013  . Cancer     SKIN  . Cataract    Past Surgical History  Procedure Laterality Date  .  Chymopapain injection  1986    Lumbar  . Lumbar laminectomy  1986  . Ovarian cyst removal  2006  . Hernia repair  05/15/2012    ventral incisional   . Cholecystectomy  ? 2009  . Varicose vein surgery  ~ 2008    bilaterally  . Breast biopsy  1988    left; Neg  . Tubal ligation  1970's  . Dilation and curettage of uterus  1960's  . Ventral hernia repair  05/15/2012    Procedure: LAPAROSCOPIC VENTRAL HERNIA;  Surgeon: Gayland Curry, MD,FACS;  Location: [place];  Service: General;  Laterality: N/A;  laparoscopic ventral incisional hernia repair with mesh  . Colonoscopy    . Lumbar fusion  05/22/2013    L2  L3 decompression /fusion      Dr Lynann Bologna  . Transforaminal  lumbar interbody fusion (tlif) with pedicle screw fixation 1 level Left 05/22/2013    Procedure: TRANSFORAMINAL LUMBAR INTERBODY FUSION (TLIF) WITH PEDICLE SCREW FIXATION 1 LEVEL;  Surgeon: Sinclair Ship, MD;  Location: [place];  Service: Orthopedics;  Laterality: Left;  Left sided lumbar 2-3 Transforaminal lumbar interbody fusion with instrumentation, allograft    reports that she quit smoking about 42 years ago. Her smoking use included Cigarettes. She has a 10 pack-year smoking history. She has never used smokeless tobacco. She reports that she drinks about 1.8 ounces of alcohol per week. She reports that she does not use illicit drugs. family history includes Breast cancer in her maternal grandmother; COPD in her mother; Cancer in her maternal grandmother, mother, and son; Colon cancer in her maternal aunt; Diabetes in her father; Lung cancer in her mother; Varicose Veins in her mother. Allergies  Allergen Reactions  . Cefuroxime Other (See Comments)    Unknown    . Cephalosporins Other (See Comments)    Unknown   . Citalopram Nausea Only  . Escitalopram Nausea Only  . Penicillins Hives  . Sulfonamide Derivatives Hives  . Vancomycin Rash    rash   Current Outpatient Prescriptions on File Prior to Visit  Medication Sig Dispense Refill  . acetaminophen (TYLENOL) 500 MG tablet Take 1,000 mg by mouth every 6 (six) hours as needed for pain.      . calcium gluconate 500 MG tablet Take 500 mg by mouth 2 (two) times daily.        . carisoprodol (SOMA) 350 MG tablet Take 350 mg by mouth at bedtime.      . cholecalciferol (VITAMIN D) 400 UNITS TABS tablet Take 400 Units by mouth daily.      . diclofenac sodium (VOLTAREN) 1 % GEL Apply topically 4 (four) times daily.      . hydrochlorothiazide (MICROZIDE) 12.5 MG capsule Take 1 capsule by mouth   daily after breakfast  90 capsule  3  . hydrocortisone (PROCTOSOL HC) 2.5 % rectal cream Place 1 application rectally 2 (two) times daily.  30 g  0   . meclizine (ANTIVERT) 25 MG tablet Take 25 mg by mouth 3 (three) times daily as needed for dizziness.      Marland Kitchen omeprazole (PRILOSEC) 20 MG capsule Take 1 capsule (20 mg total) by mouth daily.  90 capsule  3  . pantoprazole (PROTONIX) 40 MG tablet daily.      . simvastatin (ZOCOR) 20 MG tablet Take 1 tablet by mouth at  bedtime  90 tablet  3   No current facility-administered medications on file prior to visit.    Review of  Systems  Constitutional: Negative for unusual diaphoresis or other sweats  HENT: Negative for ringing in ear Eyes: Negative for double vision or worsening visual disturbance.  Respiratory: Negative for choking and stridor.   Gastrointestinal: Negative for vomiting or other signifcant bowel change Genitourinary: Negative for hematuria or decreased urine volume.  Musculoskeletal: Negative for other MSK pain or swelling Skin: Negative for color change and worsening wound.  Neurological: Negative for tremors and numbness other than noted  Psychiatric/Behavioral: Negative for decreased concentration or agitation other than above       Objective:   Physical Exam BP 122/82  Pulse 79  Temp(Src) 98.2 F (36.8 C) (Oral)  Ht 5\' 5"  (1.651 m)  Wt 143 lb 8 oz (65.091 kg)  BMI 23.88 kg/m2  SpO2 97% VS noted,  Constitutional: Pt appears well-developed, well-nourished.  HENT: Head: NCAT.  Right Ear: External ear normal.  Left Ear: External ear normal.  Eyes: . Pupils are equal, round, and reactive to light. Conjunctivae and EOM are normal Neck: Normal range of motion. Neck supple.  Cardiovascular: Normal rate and regular rhythm.   Pulmonary/Chest: Effort normal and breath sounds normal.  Abd:  Soft, NT, ND, + BS Neurological: Pt is alert. Not confused , motor grossly intact Skin: Skin is warm. No rash Psychiatric: Pt behavior is normal. No agitation. mild dysphoric, mild nervous    Assessment & Plan:

## 2014-04-08 NOTE — Addendum Note (Signed)
Addended by: Sharon Seller B on: 04/08/2014 11:40 AM   Modules accepted: Orders

## 2014-04-08 NOTE — Patient Instructions (Addendum)

## 2014-04-08 NOTE — Assessment & Plan Note (Signed)
stable overall by history and exam, recent data reviewed with pt, and pt to continue medical treatment as before,  to f/u any worsening symptoms or concerns BP Readings from Last 3 Encounters:  04/08/14 122/82  04/05/14 122/78  03/12/14 135/78

## 2014-04-09 ENCOUNTER — Encounter (INDEPENDENT_AMBULATORY_CARE_PROVIDER_SITE_OTHER): Payer: Medicare Other | Admitting: General Surgery

## 2014-04-09 DIAGNOSIS — K648 Other hemorrhoids: Secondary | ICD-10-CM | POA: Diagnosis not present

## 2014-04-09 DIAGNOSIS — K649 Unspecified hemorrhoids: Secondary | ICD-10-CM | POA: Diagnosis not present

## 2014-04-09 LAB — CBC WITH DIFFERENTIAL/PLATELET
Basophils Absolute: 0 10*3/uL (ref 0.0–0.1)
Basophils Relative: 0.6 % (ref 0.0–3.0)
Eosinophils Absolute: 0.1 10*3/uL (ref 0.0–0.7)
Eosinophils Relative: 2.4 % (ref 0.0–5.0)
HCT: 42.9 % (ref 36.0–46.0)
Hemoglobin: 14.4 g/dL (ref 12.0–15.0)
Lymphocytes Relative: 32.3 % (ref 12.0–46.0)
Lymphs Abs: 1.6 10*3/uL (ref 0.7–4.0)
MCHC: 33.6 g/dL (ref 30.0–36.0)
MCV: 92.4 fl (ref 78.0–100.0)
Monocytes Absolute: 0.3 10*3/uL (ref 0.1–1.0)
Monocytes Relative: 6.3 % (ref 3.0–12.0)
Neutro Abs: 2.9 10*3/uL (ref 1.4–7.7)
Neutrophils Relative %: 58.4 % (ref 43.0–77.0)
Platelets: 253 10*3/uL (ref 150.0–400.0)
RBC: 4.65 Mil/uL (ref 3.87–5.11)
RDW: 13.9 % (ref 11.5–15.5)
WBC: 5 10*3/uL (ref 4.0–10.5)

## 2014-04-24 ENCOUNTER — Telehealth: Payer: Self-pay | Admitting: *Deleted

## 2014-04-24 ENCOUNTER — Telehealth: Payer: Self-pay

## 2014-04-24 MED ORDER — DICLOFENAC SODIUM 1 % TD GEL: 4.0000 g | Freq: Four times a day (QID) | TRANSDERMAL | Status: DC

## 2014-04-24 MED ORDER — MELOXICAM 7.5 MG PO TABS: 7.5000 mg | ORAL_TABLET | Freq: Every day | ORAL | Status: DC

## 2014-04-24 NOTE — Telephone Encounter (Signed)
Fax from pharmacy to inform her plan does not cover Voltaren 1 % gel, please offer alternative.

## 2014-04-24 NOTE — Telephone Encounter (Signed)
Ok for mobic 7.5 qd prn - done erx 

## 2014-04-24 NOTE — Telephone Encounter (Signed)
Pt stated she received letter from [facility] stating they are unable to fill the voltaren gel. They have tried to contact md but not been success. Inform pt will contact [facility] to check status. Called [facility] spoke with pharmacy rep he stated voltaren gel is considered flammable and they can not mail. Pt will have to get from her local pharmacy. Called pt back inform her with pharmacist response. Sending rx to [facility] to filled locally...Johny Chess

## 2014-04-30 ENCOUNTER — Other Ambulatory Visit: Payer: Self-pay | Admitting: Physical Medicine and Rehabilitation

## 2014-04-30 DIAGNOSIS — M5442 Lumbago with sciatica, left side: Secondary | ICD-10-CM

## 2014-05-01 ENCOUNTER — Ambulatory Visit
Admission: RE | Admit: 2014-05-01 | Discharge: 2014-05-01 | Disposition: A | Discharge status: Home / Self Care | Payer: Medicare Other | Source: Ambulatory Visit | Attending: Physical Medicine and Rehabilitation | Admitting: Physical Medicine and Rehabilitation

## 2014-05-01 VITALS — BP 163/77 | HR 67

## 2014-05-01 DIAGNOSIS — M545 Low back pain: Secondary | ICD-10-CM | POA: Diagnosis not present

## 2014-05-01 DIAGNOSIS — M5442 Lumbago with sciatica, left side: Secondary | ICD-10-CM

## 2014-05-01 MED ORDER — METHYLPREDNISOLONE ACETATE 40 MG/ML INJ SUSP (RADIOLOG
120.0000 mg | Freq: Once | INTRAMUSCULAR | Status: AC
  Administered 2014-05-01: 120 mg via EPIDURAL

## 2014-05-01 MED ORDER — IOHEXOL 180 MG/ML  SOLN
1.0000 mL | Freq: Once | INTRAMUSCULAR | Status: AC | PRN
  Administered 2014-05-01: 1 mL via EPIDURAL

## 2014-05-01 NOTE — Discharge Instructions (Addendum)

## 2014-05-07 ENCOUNTER — Telehealth: Payer: Self-pay | Admitting: Radiology

## 2014-05-07 NOTE — Telephone Encounter (Signed)
Called pt back to tell her that her message was called to hospital for Dr. Jeralyn Ruths and that he did not come in until later today.

## 2014-05-07 NOTE — Telephone Encounter (Signed)
Pt c/o increased pain post injection and wants to talk to Dr. Jeralyn Ruths. Explained for some people that this does happen and she should speak with the physician who sent her here. She insists that she has to speak to Dr. Jeralyn Ruths. Tried to page him and will leave message at the hospital,

## 2014-05-08 DIAGNOSIS — Z1231 Encounter for screening mammogram for malignant neoplasm of breast: Secondary | ICD-10-CM | POA: Diagnosis not present

## 2014-05-12 DIAGNOSIS — Z08 Encounter for follow-up examination after completed treatment for malignant neoplasm: Secondary | ICD-10-CM | POA: Diagnosis not present

## 2014-05-12 DIAGNOSIS — Z85828 Personal history of other malignant neoplasm of skin: Secondary | ICD-10-CM | POA: Diagnosis not present

## 2014-05-12 DIAGNOSIS — D2339 Other benign neoplasm of skin of other parts of face: Secondary | ICD-10-CM | POA: Diagnosis not present

## 2014-05-12 DIAGNOSIS — L570 Actinic keratosis: Secondary | ICD-10-CM | POA: Diagnosis not present

## 2014-05-13 DIAGNOSIS — H2513 Age-related nuclear cataract, bilateral: Secondary | ICD-10-CM | POA: Diagnosis not present

## 2014-05-20 DIAGNOSIS — Z79899 Other long term (current) drug therapy: Secondary | ICD-10-CM | POA: Diagnosis not present

## 2014-05-20 DIAGNOSIS — Z882 Allergy status to sulfonamides status: Secondary | ICD-10-CM | POA: Diagnosis not present

## 2014-05-20 DIAGNOSIS — Z9089 Acquired absence of other organs: Secondary | ICD-10-CM | POA: Diagnosis not present

## 2014-05-20 DIAGNOSIS — Z888 Allergy status to other drugs, medicaments and biological substances status: Secondary | ICD-10-CM | POA: Diagnosis not present

## 2014-05-20 DIAGNOSIS — R079 Chest pain, unspecified: Secondary | ICD-10-CM | POA: Diagnosis not present

## 2014-05-20 DIAGNOSIS — Z880 Allergy status to penicillin: Secondary | ICD-10-CM | POA: Diagnosis not present

## 2014-05-20 DIAGNOSIS — Z791 Long term (current) use of non-steroidal anti-inflammatories (NSAID): Secondary | ICD-10-CM | POA: Diagnosis not present

## 2014-05-20 DIAGNOSIS — E780 Pure hypercholesterolemia: Secondary | ICD-10-CM | POA: Diagnosis not present

## 2014-05-20 DIAGNOSIS — K219 Gastro-esophageal reflux disease without esophagitis: Secondary | ICD-10-CM | POA: Diagnosis not present

## 2014-05-23 DIAGNOSIS — M5416 Radiculopathy, lumbar region: Secondary | ICD-10-CM | POA: Diagnosis not present

## 2014-05-26 ENCOUNTER — Other Ambulatory Visit: Payer: Self-pay | Admitting: Orthopedic Surgery

## 2014-05-26 DIAGNOSIS — M545 Low back pain: Principal | ICD-10-CM

## 2014-05-26 DIAGNOSIS — G8929 Other chronic pain: Secondary | ICD-10-CM

## 2014-05-27 ENCOUNTER — Ambulatory Visit: Payer: Medicare Other | Admitting: Internal Medicine

## 2014-05-30 ENCOUNTER — Ambulatory Visit
Admission: RE | Admit: 2014-05-30 | Discharge: 2014-05-30 | Disposition: A | Discharge status: Home / Self Care | Payer: Medicare Other | Source: Ambulatory Visit | Attending: Orthopedic Surgery | Admitting: Orthopedic Surgery

## 2014-05-30 DIAGNOSIS — M545 Low back pain, unspecified: Secondary | ICD-10-CM

## 2014-05-30 DIAGNOSIS — M5137 Other intervertebral disc degeneration, lumbosacral region: Secondary | ICD-10-CM | POA: Diagnosis not present

## 2014-05-30 DIAGNOSIS — M47817 Spondylosis without myelopathy or radiculopathy, lumbosacral region: Secondary | ICD-10-CM | POA: Diagnosis not present

## 2014-05-30 DIAGNOSIS — M4326 Fusion of spine, lumbar region: Secondary | ICD-10-CM | POA: Diagnosis not present

## 2014-05-30 DIAGNOSIS — M960 Pseudarthrosis after fusion or arthrodesis: Secondary | ICD-10-CM | POA: Diagnosis not present

## 2014-05-30 DIAGNOSIS — G8929 Other chronic pain: Secondary | ICD-10-CM

## 2014-05-30 DIAGNOSIS — M5416 Radiculopathy, lumbar region: Secondary | ICD-10-CM | POA: Diagnosis not present

## 2014-05-30 MED ORDER — DIAZEPAM 5 MG PO TABS
10.0000 mg | ORAL_TABLET | Freq: Once | ORAL | Status: AC
  Administered 2014-05-30: 5 mg via ORAL

## 2014-05-30 MED ORDER — IOHEXOL 180 MG/ML  SOLN
15.0000 mL | Freq: Once | INTRAMUSCULAR | Status: AC | PRN
  Administered 2014-05-30: 15 mL via INTRATHECAL

## 2014-05-30 NOTE — Discharge Instructions (Signed)

## 2014-06-04 DIAGNOSIS — Z79891 Long term (current) use of opiate analgesic: Secondary | ICD-10-CM | POA: Diagnosis not present

## 2014-06-04 DIAGNOSIS — Z79899 Other long term (current) drug therapy: Secondary | ICD-10-CM | POA: Diagnosis not present

## 2014-06-04 DIAGNOSIS — G894 Chronic pain syndrome: Secondary | ICD-10-CM | POA: Diagnosis not present

## 2014-06-04 DIAGNOSIS — M5137 Other intervertebral disc degeneration, lumbosacral region: Secondary | ICD-10-CM | POA: Diagnosis not present

## 2014-06-12 DIAGNOSIS — M5416 Radiculopathy, lumbar region: Secondary | ICD-10-CM | POA: Diagnosis not present

## 2014-06-25 ENCOUNTER — Telehealth: Payer: Self-pay | Admitting: Gastroenterology

## 2014-06-25 MED ORDER — SUCRALFATE 1 GM/10ML PO SUSP: 1.0000 g | Freq: Three times a day (TID) | ORAL | Status: DC

## 2014-06-25 NOTE — Telephone Encounter (Signed)
Pt aware and prescription sent  

## 2014-06-25 NOTE — Telephone Encounter (Signed)
The pt states she took antibiotic for 2 days prior to dental work and developed stomach pain.  She was prescribed carafate in 2013 and that helped with a similar situation then.  Can she have a refill of carafate now?

## 2014-06-25 NOTE — Telephone Encounter (Signed)
That is ok  carafate 1gm po tid, take for 5 days, disp one month with 3 refills.

## 2014-07-02 DIAGNOSIS — M5137 Other intervertebral disc degeneration, lumbosacral region: Secondary | ICD-10-CM | POA: Diagnosis not present

## 2014-07-25 HISTORY — PX: OTHER SURGICAL HISTORY: SHX169

## 2014-07-28 DIAGNOSIS — L600 Ingrowing nail: Secondary | ICD-10-CM | POA: Diagnosis not present

## 2014-07-28 DIAGNOSIS — M79674 Pain in right toe(s): Secondary | ICD-10-CM | POA: Diagnosis not present

## 2014-07-28 DIAGNOSIS — B351 Tinea unguium: Secondary | ICD-10-CM | POA: Diagnosis not present

## 2014-07-31 DIAGNOSIS — M5417 Radiculopathy, lumbosacral region: Secondary | ICD-10-CM | POA: Diagnosis not present

## 2014-07-31 DIAGNOSIS — M5137 Other intervertebral disc degeneration, lumbosacral region: Secondary | ICD-10-CM | POA: Diagnosis not present

## 2014-07-31 DIAGNOSIS — M533 Sacrococcygeal disorders, not elsewhere classified: Secondary | ICD-10-CM | POA: Diagnosis not present

## 2014-08-02 DIAGNOSIS — E785 Hyperlipidemia, unspecified: Secondary | ICD-10-CM | POA: Diagnosis not present

## 2014-08-02 DIAGNOSIS — R109 Unspecified abdominal pain: Secondary | ICD-10-CM | POA: Diagnosis not present

## 2014-08-02 DIAGNOSIS — K2970 Gastritis, unspecified, without bleeding: Secondary | ICD-10-CM | POA: Diagnosis not present

## 2014-08-02 DIAGNOSIS — J449 Chronic obstructive pulmonary disease, unspecified: Secondary | ICD-10-CM | POA: Diagnosis not present

## 2014-08-02 DIAGNOSIS — Z9049 Acquired absence of other specified parts of digestive tract: Secondary | ICD-10-CM | POA: Diagnosis not present

## 2014-08-02 DIAGNOSIS — Z79899 Other long term (current) drug therapy: Secondary | ICD-10-CM | POA: Diagnosis not present

## 2014-08-02 DIAGNOSIS — Z9851 Tubal ligation status: Secondary | ICD-10-CM | POA: Diagnosis not present

## 2014-08-02 DIAGNOSIS — I10 Essential (primary) hypertension: Secondary | ICD-10-CM | POA: Diagnosis not present

## 2014-08-03 DIAGNOSIS — R109 Unspecified abdominal pain: Secondary | ICD-10-CM | POA: Diagnosis not present

## 2014-08-07 DIAGNOSIS — M5416 Radiculopathy, lumbar region: Secondary | ICD-10-CM | POA: Diagnosis not present

## 2014-08-08 DIAGNOSIS — R109 Unspecified abdominal pain: Secondary | ICD-10-CM | POA: Diagnosis not present

## 2014-08-08 DIAGNOSIS — R1032 Left lower quadrant pain: Secondary | ICD-10-CM | POA: Diagnosis not present

## 2014-08-13 ENCOUNTER — Other Ambulatory Visit: Payer: Self-pay

## 2014-08-13 MED ORDER — SIMVASTATIN 20 MG PO TABS: 20.0000 mg | ORAL_TABLET | Freq: Every day | ORAL | Status: DC

## 2014-08-13 MED ORDER — HYDROCHLOROTHIAZIDE 12.5 MG PO CAPS: 12.5000 mg | ORAL_CAPSULE | Freq: Every day | ORAL | Status: DC

## 2014-08-13 MED ORDER — OMEPRAZOLE 20 MG PO CPDR: 20.0000 mg | DELAYED_RELEASE_CAPSULE | Freq: Every day | ORAL | Status: DC

## 2014-08-13 NOTE — Addendum Note (Signed)
Addended by: Biagio Borg on: 08/13/2014 12:18 PM   Modules accepted: Orders

## 2014-08-13 NOTE — Telephone Encounter (Signed)
too soon for soma refill, had 6 mo supply done sept 2015

## 2014-08-13 NOTE — Addendum Note (Signed)
Addended by: Sharon Seller B on: 08/13/2014 08:58 AM   Modules accepted: Orders

## 2014-08-26 DIAGNOSIS — L910 Hypertrophic scar: Secondary | ICD-10-CM | POA: Diagnosis not present

## 2014-08-26 DIAGNOSIS — M79673 Pain in unspecified foot: Secondary | ICD-10-CM | POA: Diagnosis not present

## 2014-08-26 DIAGNOSIS — L259 Unspecified contact dermatitis, unspecified cause: Secondary | ICD-10-CM | POA: Diagnosis not present

## 2014-08-28 DIAGNOSIS — M961 Postlaminectomy syndrome, not elsewhere classified: Secondary | ICD-10-CM | POA: Diagnosis not present

## 2014-08-28 DIAGNOSIS — G894 Chronic pain syndrome: Secondary | ICD-10-CM | POA: Diagnosis not present

## 2014-08-28 DIAGNOSIS — M545 Low back pain: Secondary | ICD-10-CM | POA: Diagnosis not present

## 2014-09-10 DIAGNOSIS — M961 Postlaminectomy syndrome, not elsewhere classified: Secondary | ICD-10-CM | POA: Diagnosis not present

## 2014-09-10 DIAGNOSIS — G894 Chronic pain syndrome: Secondary | ICD-10-CM | POA: Diagnosis not present

## 2014-09-10 DIAGNOSIS — M5417 Radiculopathy, lumbosacral region: Secondary | ICD-10-CM | POA: Diagnosis not present

## 2014-09-13 DIAGNOSIS — F4323 Adjustment disorder with mixed anxiety and depressed mood: Secondary | ICD-10-CM | POA: Diagnosis not present

## 2014-09-14 DIAGNOSIS — F4323 Adjustment disorder with mixed anxiety and depressed mood: Secondary | ICD-10-CM | POA: Diagnosis not present

## 2014-09-15 DIAGNOSIS — F4323 Adjustment disorder with mixed anxiety and depressed mood: Secondary | ICD-10-CM | POA: Diagnosis not present

## 2014-09-18 DIAGNOSIS — M5417 Radiculopathy, lumbosacral region: Secondary | ICD-10-CM | POA: Diagnosis not present

## 2014-10-02 DIAGNOSIS — M533 Sacrococcygeal disorders, not elsewhere classified: Secondary | ICD-10-CM | POA: Diagnosis not present

## 2014-10-08 DIAGNOSIS — M533 Sacrococcygeal disorders, not elsewhere classified: Secondary | ICD-10-CM | POA: Diagnosis not present

## 2014-10-27 ENCOUNTER — Telehealth: Payer: Self-pay | Admitting: Gastroenterology

## 2014-10-27 ENCOUNTER — Other Ambulatory Visit: Payer: Self-pay

## 2014-10-28 MED ORDER — SUCRALFATE 1 GM/10ML PO SUSP: 1.0000 g | Freq: Three times a day (TID) | ORAL | Status: DC

## 2014-10-28 NOTE — Telephone Encounter (Signed)
rx sent per request. 

## 2014-10-31 DIAGNOSIS — K2990 Gastroduodenitis, unspecified, without bleeding: Secondary | ICD-10-CM

## 2014-10-31 DIAGNOSIS — K2970 Gastritis, unspecified, without bleeding: Secondary | ICD-10-CM | POA: Insufficient documentation

## 2014-11-06 DIAGNOSIS — K2900 Acute gastritis without bleeding: Secondary | ICD-10-CM | POA: Diagnosis not present

## 2014-11-10 DIAGNOSIS — L821 Other seborrheic keratosis: Secondary | ICD-10-CM | POA: Diagnosis not present

## 2014-11-10 DIAGNOSIS — Z08 Encounter for follow-up examination after completed treatment for malignant neoplasm: Secondary | ICD-10-CM | POA: Diagnosis not present

## 2014-11-10 DIAGNOSIS — Z85828 Personal history of other malignant neoplasm of skin: Secondary | ICD-10-CM | POA: Diagnosis not present

## 2014-11-10 DIAGNOSIS — D1801 Hemangioma of skin and subcutaneous tissue: Secondary | ICD-10-CM | POA: Diagnosis not present

## 2014-11-14 DIAGNOSIS — R1013 Epigastric pain: Secondary | ICD-10-CM | POA: Diagnosis not present

## 2014-11-28 DIAGNOSIS — M961 Postlaminectomy syndrome, not elsewhere classified: Secondary | ICD-10-CM | POA: Diagnosis not present

## 2014-11-28 DIAGNOSIS — M5417 Radiculopathy, lumbosacral region: Secondary | ICD-10-CM | POA: Diagnosis not present

## 2014-11-28 DIAGNOSIS — G894 Chronic pain syndrome: Secondary | ICD-10-CM | POA: Diagnosis not present

## 2014-12-01 DIAGNOSIS — R1013 Epigastric pain: Secondary | ICD-10-CM | POA: Diagnosis not present

## 2014-12-09 ENCOUNTER — Telehealth: Payer: Self-pay | Admitting: Internal Medicine

## 2014-12-09 MED ORDER — CARISOPRODOL 350 MG PO TABS: 350.0000 mg | ORAL_TABLET | Freq: Every day | ORAL | Status: DC

## 2014-12-09 NOTE — Telephone Encounter (Signed)
Patient requesting refill for carisoprodol (SOMA) 350 MG tablet [[id number]. Pharmacy is [facility]

## 2014-12-09 NOTE — Telephone Encounter (Signed)
Done hardcopy to Dahlia  

## 2014-12-10 NOTE — Telephone Encounter (Signed)
Rx faxed to pharmacy  

## 2014-12-24 DIAGNOSIS — R52 Pain, unspecified: Secondary | ICD-10-CM | POA: Diagnosis not present

## 2014-12-25 DIAGNOSIS — H903 Sensorineural hearing loss, bilateral: Secondary | ICD-10-CM | POA: Diagnosis not present

## 2014-12-26 DIAGNOSIS — M5417 Radiculopathy, lumbosacral region: Secondary | ICD-10-CM | POA: Diagnosis not present

## 2014-12-26 DIAGNOSIS — M961 Postlaminectomy syndrome, not elsewhere classified: Secondary | ICD-10-CM | POA: Diagnosis not present

## 2014-12-26 DIAGNOSIS — G894 Chronic pain syndrome: Secondary | ICD-10-CM | POA: Diagnosis not present

## 2015-01-06 DIAGNOSIS — L608 Other nail disorders: Secondary | ICD-10-CM | POA: Diagnosis not present

## 2015-01-06 DIAGNOSIS — S90441A External constriction, right great toe, initial encounter: Secondary | ICD-10-CM | POA: Diagnosis not present

## 2015-01-06 DIAGNOSIS — R262 Difficulty in walking, not elsewhere classified: Secondary | ICD-10-CM | POA: Diagnosis not present

## 2015-01-06 DIAGNOSIS — M79671 Pain in right foot: Secondary | ICD-10-CM | POA: Diagnosis not present

## 2015-01-09 ENCOUNTER — Telehealth: Payer: Self-pay | Admitting: Internal Medicine

## 2015-01-09 NOTE — Telephone Encounter (Signed)
Pt called in and wants a referral to a Cardiologist asap?  Are we able to put the referral in with a OV??    Spinal cord stiumlator , Dr Orpah Melter in [place] advised her to see Cardiologist asap.

## 2015-01-09 NOTE — Telephone Encounter (Signed)
Does Pt need an

## 2015-01-09 NOTE — Telephone Encounter (Signed)
Pt advised to make ROV with Dr Jenny Reichmann. Pt is complain of leg twitching since having spinal cord stimulator placed

## 2015-01-09 NOTE — Telephone Encounter (Signed)
I would need a reason for the referra from the patient to make sure it is appropriate,  or documentation from Dr Orpah Melter as to why

## 2015-01-14 ENCOUNTER — Ambulatory Visit (INDEPENDENT_AMBULATORY_CARE_PROVIDER_SITE_OTHER): Payer: Medicare Other | Admitting: Internal Medicine

## 2015-01-14 ENCOUNTER — Encounter: Payer: Self-pay | Admitting: Internal Medicine

## 2015-01-14 VITALS — BP 138/76 | HR 78 | Temp 98.5°F | Ht 65.0 in | Wt 141.0 lb

## 2015-01-14 DIAGNOSIS — I10 Essential (primary) hypertension: Secondary | ICD-10-CM | POA: Diagnosis not present

## 2015-01-14 DIAGNOSIS — R002 Palpitations: Secondary | ICD-10-CM | POA: Insufficient documentation

## 2015-01-14 DIAGNOSIS — F329 Major depressive disorder, single episode, unspecified: Secondary | ICD-10-CM | POA: Diagnosis not present

## 2015-01-14 DIAGNOSIS — F32A Depression, unspecified: Secondary | ICD-10-CM

## 2015-01-14 DIAGNOSIS — R0600 Dyspnea, unspecified: Secondary | ICD-10-CM | POA: Diagnosis not present

## 2015-01-14 NOTE — Assessment & Plan Note (Signed)
stable overall by history and exam, recent data reviewed with pt, and pt to continue medical treatment as before,  to f/u any worsening symptoms or concerns BP Readings from Last 3 Encounters:  01/14/15 138/76  05/30/14 117/53  05/01/14 163/77

## 2015-01-14 NOTE — Progress Notes (Signed)
Subjective:    Patient ID: Crystal Herrera, female    DOB: Jul 14, 1941, 74 y.o.   MRN: [id number]  HPI   Here for f/u;  Overall doing ok;  Pt denies Chest pain, worsening SOB, DOE, wheezing, orthopnea, PND, worsening LE edema, palpitations, dizziness or syncope.  Pt denies neurological change such as new headache, facial or extremity weakness.  Pt denies polydipsia, polyuria, or low sugar symptoms. Pt states overall good compliance with treatment and medications, good tolerability, and has been trying to follow appropriate diet.  Pt denies worsening depressive symptoms, suicidal ideation or panic. No fever, night sweats, wt loss, loss of appetite, or other constitutional symptoms. she is S/p spinal cord stinulator placement recently, sp spnial fusion 2014 but still had LLE pain. Had seen pain specialist in [place], was in [facility] and saw a Dr Orpah Melter for a second opinion (has had prior ESI with him before),  Had the stimulator placed there instead of [place].  Had some LE nerve pain after with lying down, assoc with palpitations and mild sob. Asked to come here for "cardiac workup" per MD in [facility].   Past Medical History  Diagnosis Date  . HYPERLIPIDEMIA 02/09/2010  . HYPERTENSION 02/09/2010  . GERD 02/09/2010  . SKIN LESION 03/22/2010  . OCCIPITAL NEURALGIA 06/09/2010  . BACK PAIN, CHRONIC 02/09/2010  . OSTEOPENIA 02/09/2010  . FATIGUE 02/09/2010  . Diarrhea 04/26/2010  . COLONIC POLYPS, HX OF 02/09/2010  . Lumbar disc disease   . Complication of anesthesia   . PONV (postoperative nausea and vomiting)   . DEPRESSION 02/09/2010    pt. takes Cymbalta for pain issues   . COMMON MIGRAINE 02/09/2010    neuritis in R side of neck & head  . Neuromuscular disorder     neuritis in head & neck  . DEGENERATIVE JOINT DISEASE 02/09/2010    hands , lumbar stenosis   . Varicose vein of leg     BLE  . Pneumonia     "as an infant" (05/15/2012)  . Anemia 1948?  Marland Kitchen Allergic rhinitis, cause unspecified  01/29/2013  . Cancer     SKIN  . Cataract    Past Surgical History  Procedure Laterality Date  . Chymopapain injection  1986    Lumbar  . Lumbar laminectomy  1986  . Ovarian cyst removal  2006  . Hernia repair  05/15/2012    ventral incisional   . Cholecystectomy  ? 2009  . Varicose vein surgery  ~ 2008    bilaterally  . Breast biopsy  1988    left; Neg  . Tubal ligation  1970's  . Dilation and curettage of uterus  1960's  . Ventral hernia repair  05/15/2012    Procedure: LAPAROSCOPIC VENTRAL HERNIA;  Surgeon: Gayland Curry, MD,FACS;  Location: [facility];  Service: General;  Laterality: N/A;  laparoscopic ventral incisional hernia repair with mesh  . Colonoscopy    . Lumbar fusion  05/22/2013    L2  L3 decompression /fusion      Dr Lynann Bologna  . Transforaminal lumbar interbody fusion (tlif) with pedicle screw fixation 1 level Left 05/22/2013    Procedure: TRANSFORAMINAL LUMBAR INTERBODY FUSION (TLIF) WITH PEDICLE SCREW FIXATION 1 LEVEL;  Surgeon: Sinclair Ship, MD;  Location: [facility];  Service: Orthopedics;  Laterality: Left;  Left sided lumbar 2-3 Transforaminal lumbar interbody fusion with instrumentation, allograft    reports that she quit smoking about 43 years ago. Her smoking use included Cigarettes.  She has a 10 pack-year smoking history. She has never used smokeless tobacco. She reports that she drinks about 1.8 oz of alcohol per week. She reports that she does not use illicit drugs. family history includes Breast cancer in her maternal grandmother; COPD in her mother; Cancer in her maternal grandmother, mother, and son; Colon cancer in her maternal aunt; Diabetes in her father; Lung cancer in her mother; Varicose Veins in her mother. Allergies  Allergen Reactions  . Cefuroxime     Might cause hives  . Cephalosporins     Might cause hives   . Celexa [Citalopram Hydrobromide] Nausea Only  . Lexapro [Escitalopram Oxalate] Nausea Only  . Penicillins Hives  . Sulfonamide  Derivatives Hives  . Vancomycin Rash   Current Outpatient Prescriptions on File Prior to Visit  Medication Sig Dispense Refill  . acetaminophen (TYLENOL) 500 MG tablet Take 1,000 mg by mouth every 6 (six) hours as needed for pain.    . calcium gluconate 500 MG tablet Take 500 mg by mouth 2 (two) times daily.      . carisoprodol (SOMA) 350 MG tablet Take 1 tablet (350 mg total) by mouth at bedtime. 90 tablet 1  . cholecalciferol (VITAMIN D) 400 UNITS TABS tablet Take 400 Units by mouth daily.    . hydrochlorothiazide (MICROZIDE) 12.5 MG capsule Take 1 capsule (12.5 mg total) by mouth daily. 90 capsule 3  . hydrocortisone (PROCTOSOL HC) 2.5 % rectal cream Place 1 application rectally 2 (two) times daily. 30 g 0  . meclizine (ANTIVERT) 25 MG tablet Take 25 mg by mouth 3 (three) times daily as needed for dizziness.    Marland Kitchen omeprazole (PRILOSEC) 20 MG capsule Take 1 capsule (20 mg total) by mouth daily. 90 capsule 3  . simvastatin (ZOCOR) 20 MG tablet Take 1 tablet (20 mg total) by mouth daily at 6 PM. 90 tablet 3  . sucralfate (CARAFATE) 1 GM/10ML suspension Take 10 mLs (1 g total) by mouth 4 (four) times daily -  with meals and at bedtime. Take for 5 days 420 mL 3  . meloxicam (MOBIC) 7.5 MG tablet Take 1 tablet (7.5 mg total) by mouth daily. As needed for pain (Patient not taking: Reported on 01/14/2015) 30 tablet 5  . pantoprazole (PROTONIX) 40 MG tablet daily.     No current facility-administered medications on file prior to visit.    Review of Systems  Constitutional: Negative for unusual diaphoresis or night sweats HENT: Negative for ringing in ear or discharge Eyes: Negative for double vision or worsening visual disturbance.  Respiratory: Negative for choking and stridor.   Gastrointestinal: Negative for vomiting or other signifcant bowel change Genitourinary: Negative for hematuria or change in urine volume.  Musculoskeletal: Negative for other MSK pain or swelling Skin: Negative for  color change and worsening wound.  Neurological: Negative for tremors and numbness other than noted  Psychiatric/Behavioral: Negative for decreased concentration or agitation other than above       Objective:   Physical Exam BP 138/76 mmHg  Pulse 78  Temp(Src) 98.5 F (36.9 C) (Oral)  Ht 5\' 5"  (1.651 m)  Wt 141 lb (63.957 kg)  BMI 23.46 kg/m2  SpO2 96% VS noted,  Constitutional: Pt appears in no significant distress HENT: Head: NCAT.  Right Ear: External ear normal.  Left Ear: External ear normal.  Eyes: . Pupils are equal, round, and reactive to light. Conjunctivae and EOM are normal Neck: Normal range of motion. Neck supple.  Cardiovascular: Normal rate  and regular rhythm.   Pulmonary/Chest: Effort normal and breath sounds without rales or wheezing.  Abd:  Soft, NT, ND, + BS Neurological: Pt is alert. Not confused , motor grossly intact Skin: Skin is warm. No rash, no LE edema Psychiatric: Pt behavior is normal. No agitation. not depressed affect, mild nervous Wearing back brace s/p implanted pulse generator    Assessment & Plan:

## 2015-01-14 NOTE — Patient Instructions (Signed)
Please continue all other medications as before, and refills have been done if requested.  Please have the pharmacy call with any other refills you may need.  Please continue your efforts at being more active, low cholesterol diet, and weight control.  You are otherwise up to date with prevention measures today.  Please keep your appointments with your specialists as you may have planned  You will be contacted regarding the referral for: cardiology, and echocardiogram

## 2015-01-14 NOTE — Assessment & Plan Note (Signed)
stable overall by history and exam, recent data reviewed with pt, and pt to continue medical treatment as before,  to f/u any worsening symptoms or concerns Lab Results  Component Value Date   WBC 5.0 04/08/2014   HGB 14.4 04/08/2014   HCT 42.9 04/08/2014   PLT 253.0 04/08/2014   GLUCOSE 79 04/08/2014   CHOL 173 04/08/2014   TRIG 170.0* 04/08/2014   HDL 49.80 04/08/2014   LDLDIRECT 122.2 02/09/2010   LDLCALC 89 04/08/2014   ALT 17 04/08/2014   AST 18 04/08/2014   NA 139 04/08/2014   K 3.8 04/08/2014   CL 100 04/08/2014   CREATININE 0.7 04/08/2014   BUN 14 04/08/2014   CO2 30 04/08/2014   TSH 1.43 04/08/2014   INR 0.97 05/22/2013

## 2015-01-14 NOTE — Assessment & Plan Note (Addendum)
?.   Etiology, anxiety related is a concern, cant r/o other cardica, for echo, refer card per pt request, consider holter

## 2015-01-17 ENCOUNTER — Emergency Department (INDEPENDENT_AMBULATORY_CARE_PROVIDER_SITE_OTHER)
Admission: EM | Admit: 2015-01-17 | Discharge: 2015-01-17 | Disposition: A | Discharge status: Home / Self Care | Payer: Medicare Other | Source: Home / Self Care | Attending: Family Medicine | Admitting: Family Medicine

## 2015-01-17 ENCOUNTER — Encounter: Payer: Self-pay | Admitting: Emergency Medicine

## 2015-01-17 DIAGNOSIS — B001 Herpesviral vesicular dermatitis: Secondary | ICD-10-CM | POA: Diagnosis not present

## 2015-01-17 MED ORDER — VALACYCLOVIR HCL 1 G PO TABS: ORAL_TABLET | ORAL | Status: DC

## 2015-01-17 MED ORDER — MUPIROCIN 2 % EX OINT: 1.0000 "application " | TOPICAL_OINTMENT | Freq: Three times a day (TID) | CUTANEOUS | Status: DC

## 2015-01-17 NOTE — Discharge Instructions (Signed)
Cold Sore  A cold sore (fever blister) is a skin infection caused by the herpes simplex virus (HSV-1). HSV-1 is closely related to the virus that causes genital herpes (HSV-2), but they are not the same even though both viruses can cause oral and genital infections. Cold sores are small, fluid-filled sores inside of the mouth or on the lips, gums, nose, chin, cheeks, or fingers.   The herpes simplex virus can be easily passed (contagious) to other people through close personal contact, such as kissing or sharing personal items. The virus can also spread to other parts of the body, such as the eyes or genitals. Cold sores are contagious until the sores crust over completely. They often heal within 2 weeks.   Once a person is infected, the herpes simplex virus remains permanently in the body. Therefore, there is no cure for cold sores, and they often recur when a person is tired, stressed, sick, or gets too much sun. Additional factors that can cause a recurrence include hormone changes in menstruation or pregnancy, certain drugs, and cold weather.   CAUSES   Cold sores are caused by the herpes simplex virus. The virus is spread from person to person through close contact, such as through kissing, touching the affected area, or sharing personal items such as lip balm, razors, or eating utensils.   SYMPTOMS   The first infection may not cause symptoms. If symptoms develop, the symptoms often go through different stages. Here is how a cold sore develops:   · Tingling, itching, or burning is felt 1-2 days before the outbreak.    · Fluid-filled blisters appear on the lips, inside the mouth, nose, or on the cheeks.    · The blisters start to ooze clear fluid.    · The blisters dry up and a yellow crust appears in its place.    · The crust falls off.    Symptoms depend on whether it is the initial outbreak or a recurrence. Some other symptoms with the first outbreak may include:   · Fever.    · Sore throat.    · Headache.     · Muscle aches.    · Swollen neck glands.    DIAGNOSIS   A diagnosis is often made based on your symptoms and looking at the sores. Sometimes, a sore may be swabbed and then examined in the lab to make a final diagnosis. If the sores are not present, blood tests can find the herpes simplex virus.   TREATMENT   There is no cure for cold sores and no vaccine for the herpes simplex virus. Within 2 weeks, most cold sores go away on their own without treatment. Medicines cannot make the infection go away, but medicine can help relieve some of the pain associated with the sores, can work to stop the virus from multiplying, and can also shorten healing time. Medicine may be in the form of creams, gels, pills, or a shot.   HOME CARE INSTRUCTIONS   · Only take over-the-counter or prescription medicines for pain, discomfort, or fever as directed by your caregiver. Do not use aspirin.    · Use a cotton-tip swab to apply creams or gels to your sores.    · Do not touch the sores or pick the scabs. Wash your hands often. Do not touch your eyes without washing your hands first.    · Avoid kissing, oral sex, and sharing personal items until sores heal.    · Apply an ice pack on your sores for 10-15 minutes to ease any discomfort.    ·   Avoid hot, cold, or salty foods because they may hurt your mouth. Eat a soft, bland diet to avoid irritating the sores. Use a straw to drink if you have pain when drinking out of a glass.    · Keep sores clean and dry to prevent an infection of other tissues.    · Avoid the sun and limit stress if these things trigger outbreaks. If sun causes cold sores, apply sunscreen on the lips before being out in the sun.    SEEK MEDICAL CARE IF:   · You have a fever or persistent symptoms for more than 2-3 days.    · You have a fever and your symptoms suddenly get worse.    · You have pus, not clear fluid, coming from the sores.    · You have redness that is spreading.    · You have pain or irritation in your  eye.    · You get sores on your genitals.    · Your sores do not heal within 2 weeks.    · You have a weakened immune system.    · You have frequent recurrences of cold sores.    MAKE SURE YOU:   · Understand these instructions.  · Will watch your condition.  · Will get help right away if you are not doing well or get worse.  Document Released: 07/08/2000 Document Revised: 11/25/2013 Document Reviewed: 11/23/2011  [employer]® Patient Information ©2015 [employer]. This information is not intended to replace advice given to you by your health care provider. Make sure you discuss any questions you have with your health care provider.

## 2015-01-17 NOTE — ED Notes (Signed)
Pt c/o blister on her lip x3 days. Denies recent sickness and states this has never happened in past.

## 2015-01-17 NOTE — ED Provider Notes (Signed)
CSN: [id number]     Arrival date & time 01/17/15  6789 History   First MD Initiated Contact with Patient 01/17/15 [phone]     Chief Complaint  Patient presents with  . Mouth Lesions     HPI Comments: Patient complains of sudden onset of a painful blister-like lesion on her right lower lip 3 days ago.  The lesion has gradually become worse.  She states that she had a similar lesion about one month ago that resolved spontaneously.  She has had intermittent chills for the past 3 days but feels well otherwise. She reports that she has had cold sores in the distant past.  She notes that she has been under increased stress during the past month.  Patient is a 74 y.o. female presenting with mouth sores. The history is provided by the patient.  Mouth Lesions Location:  Lower lip Lower lip location:  R outer Quality:  Blistered, crusty and painful Pain details:    Quality:  Sore   Severity:  Mild   Duration:  3 days   Timing:  Constant   Progression:  Worsening Onset quality:  Sudden Severity:  Mild Duration:  3 days Progression:  Worsening Chronicity:  Recurrent Context: stress   Context: not trauma   Relieved by:  Nothing Worsened by:  Nothing tried Ineffective treatments:  None tried Associated symptoms: no congestion, no dental pain, no ear pain, no fever, no malaise, no neck pain, no rash, no rhinorrhea, no sore throat and no swollen glands     Past Medical History  Diagnosis Date  . HYPERLIPIDEMIA 02/09/2010  . HYPERTENSION 02/09/2010  . GERD 02/09/2010  . SKIN LESION 03/22/2010  . OCCIPITAL NEURALGIA 06/09/2010  . BACK PAIN, CHRONIC 02/09/2010  . OSTEOPENIA 02/09/2010  . FATIGUE 02/09/2010  . Diarrhea 04/26/2010  . COLONIC POLYPS, HX OF 02/09/2010  . Lumbar disc disease   . Complication of anesthesia   . PONV (postoperative nausea and vomiting)   . DEPRESSION 02/09/2010    pt. takes Cymbalta for pain issues   . COMMON MIGRAINE 02/09/2010    neuritis in R side of neck & head  .  Neuromuscular disorder     neuritis in head & neck  . DEGENERATIVE JOINT DISEASE 02/09/2010    hands , lumbar stenosis   . Varicose vein of leg     BLE  . Pneumonia     "as an infant" (05/15/2012)  . Anemia 1948?  Marland Kitchen Allergic rhinitis, cause unspecified 01/29/2013  . Cancer     SKIN  . Cataract    Past Surgical History  Procedure Laterality Date  . Chymopapain injection  1986    Lumbar  . Lumbar laminectomy  1986  . Ovarian cyst removal  2006  . Hernia repair  05/15/2012    ventral incisional   . Cholecystectomy  ? 2009  . Varicose vein surgery  ~ 2008    bilaterally  . Breast biopsy  1988    left; Neg  . Tubal ligation  1970's  . Dilation and curettage of uterus  1960's  . Ventral hernia repair  05/15/2012    Procedure: LAPAROSCOPIC VENTRAL HERNIA;  Surgeon: Gayland Curry, MD,FACS;  Location: [facility];  Service: General;  Laterality: N/A;  laparoscopic ventral incisional hernia repair with mesh  . Colonoscopy    . Lumbar fusion  05/22/2013    L2  L3 decompression /fusion      Dr Lynann Bologna  . Transforaminal lumbar interbody fusion (tlif) with  pedicle screw fixation 1 level Left 05/22/2013    Procedure: TRANSFORAMINAL LUMBAR INTERBODY FUSION (TLIF) WITH PEDICLE SCREW FIXATION 1 LEVEL;  Surgeon: Sinclair Ship, MD;  Location: [facility];  Service: Orthopedics;  Laterality: Left;  Left sided lumbar 2-3 Transforaminal lumbar interbody fusion with instrumentation, allograft   Family History  Problem Relation Age of Onset  . Lung cancer Mother   . Cancer Mother     Lung  . COPD Mother   . Varicose Veins Mother   . Diabetes Father   . Breast cancer Maternal Grandmother   . Cancer Maternal Grandmother     breast  . Colon cancer Maternal Aunt   . Cancer Son     Melanoma   History  Substance Use Topics  . Smoking status: Former Smoker -- 1.00 packs/day for 10 years    Types: Cigarettes    Quit date: 05/11/1971  . Smokeless tobacco: Never Used  . Alcohol Use: 1.8 oz/week     3 Glasses of wine per week     Comment: 05/15/2012 "wine 2-4 glasses/wk"   OB History    No data available     Review of Systems  Constitutional: Negative for fever.  HENT: Positive for mouth sores. Negative for congestion, ear pain, rhinorrhea and sore throat.   Musculoskeletal: Negative for neck pain.  Skin: Negative for rash.  All other systems reviewed and are negative.   Allergies  Cefuroxime; Cephalosporins; Celexa; Lexapro; Penicillins; Sulfonamide derivatives; and Vancomycin  Home Medications   Prior to Admission medications   Medication Sig Start Date End Date Taking? Authorizing Provider  acetaminophen (TYLENOL) 500 MG tablet Take 1,000 mg by mouth every 6 (six) hours as needed for pain.    Historical Provider, MD  calcium gluconate 500 MG tablet Take 500 mg by mouth 2 (two) times daily.      Historical Provider, MD  carisoprodol (SOMA) 350 MG tablet Take 1 tablet (350 mg total) by mouth at bedtime. 12/09/14   Biagio Borg, MD  cholecalciferol (VITAMIN D) 400 UNITS TABS tablet Take 400 Units by mouth daily.    Historical Provider, MD  hydrochlorothiazide (MICROZIDE) 12.5 MG capsule Take 1 capsule (12.5 mg total) by mouth daily. 08/13/14   Biagio Borg, MD  hydrocortisone (PROCTOSOL HC) 2.5 % rectal cream Place 1 application rectally 2 (two) times daily. 04/05/14   Lucretia Kern, DO  meclizine (ANTIVERT) 25 MG tablet Take 25 mg by mouth 3 (three) times daily as needed for dizziness.    Historical Provider, MD  meloxicam (MOBIC) 7.5 MG tablet Take 1 tablet (7.5 mg total) by mouth daily. As needed for pain Patient not taking: Reported on 01/14/2015 04/24/14   Biagio Borg, MD  mupirocin ointment (BACTROBAN) 2 % Apply 1 application topically 3 (three) times daily. Use until lesion is healed 01/17/15   Kandra Nicolas, MD  omeprazole (PRILOSEC) 20 MG capsule Take 1 capsule (20 mg total) by mouth daily. 08/13/14   Biagio Borg, MD  pantoprazole (PROTONIX) 40 MG tablet daily. 01/03/14    Historical Provider, MD  simvastatin (ZOCOR) 20 MG tablet Take 1 tablet (20 mg total) by mouth daily at 6 PM. 08/13/14   Biagio Borg, MD  sucralfate (CARAFATE) 1 GM/10ML suspension Take 10 mLs (1 g total) by mouth 4 (four) times daily -  with meals and at bedtime. Take for 5 days 10/28/14   Milus Banister, MD  valACYclovir (VALTREX) 1000 MG tablet Take two tabs  by mouth every 12 hours for one day.  Take at onset of cold sore 01/17/15   Kandra Nicolas, MD   BP 146/85 mmHg  Pulse 68  Temp(Src) 97.8 F (36.6 C) (Oral)  SpO2 99% Physical Exam  Constitutional: She appears well-developed and well-nourished. No distress.  HENT:  Head: Normocephalic.  Right Ear: External ear normal.  Left Ear: External ear normal.  Nose: Nose normal.  Mouth/Throat: Oropharynx is clear and moist and mucous membranes are normal. No oropharyngeal exudate.    On the right lower external lip is a 109mm diameter pustule with surrounding erythema and mild tenderness to palpation.  Minimal swelling noted.  Eyes: Conjunctivae and EOM are normal. Pupils are equal, round, and reactive to light.  Neck: Neck supple.  Lymphadenopathy:    She has no cervical adenopathy.  Nursing note and vitals reviewed.   ED Course  Procedures  none   MDM   1. Cold sore    Begin Valtrex 2000mg  Q12 hours for one day.  Will empirically begin Bactroban ointment to cover possibility of staph cellulitis. Followup with Family Doctor if not improved in about 5 days    Kandra Nicolas, MD 01/17/15 [phone]

## 2015-01-20 ENCOUNTER — Other Ambulatory Visit (HOSPITAL_COMMUNITY): Payer: Medicare Other

## 2015-01-22 ENCOUNTER — Ambulatory Visit (HOSPITAL_COMMUNITY): Payer: Medicare Other | Attending: Internal Medicine

## 2015-01-22 ENCOUNTER — Other Ambulatory Visit: Payer: Self-pay

## 2015-01-22 DIAGNOSIS — I340 Nonrheumatic mitral (valve) insufficiency: Secondary | ICD-10-CM | POA: Insufficient documentation

## 2015-01-22 DIAGNOSIS — I358 Other nonrheumatic aortic valve disorders: Secondary | ICD-10-CM | POA: Insufficient documentation

## 2015-01-22 DIAGNOSIS — R002 Palpitations: Secondary | ICD-10-CM | POA: Diagnosis not present

## 2015-01-22 DIAGNOSIS — R0600 Dyspnea, unspecified: Secondary | ICD-10-CM | POA: Diagnosis not present

## 2015-02-08 NOTE — Progress Notes (Signed)
HPI: 74 yo female for evaluation of palpitations. Echo 6/16 showed normal LV function and mild MR. Patient has long-standing back pain. This has required 2 surgeries. She was recently evaluated in [place] and had a stimulator placed in her back. In early June she turned the machine on and caused tingling in her legs bilaterally. This caused her to be anxious and stressed and she states her heart raced. She otherwise denies dyspnea on exertion, orthopnea, PND, pedal edema, chest pain or syncope. Because of her palpitations cardiology was asked to evaluate.  Current Outpatient Prescriptions  Medication Sig Dispense Refill  . acetaminophen (TYLENOL) 500 MG tablet Take 1,000 mg by mouth every 6 (six) hours as needed for pain.    . calcium gluconate 500 MG tablet Take 500 mg by mouth 2 (two) times daily.      . hydrochlorothiazide (MICROZIDE) 12.5 MG capsule Take 1 capsule (12.5 mg total) by mouth daily. 90 capsule 3  . meclizine (ANTIVERT) 25 MG tablet Take 25 mg by mouth 3 (three) times daily as needed for dizziness.    Marland Kitchen omeprazole (PRILOSEC) 20 MG capsule Take 1 capsule (20 mg total) by mouth daily. 90 capsule 3  . simvastatin (ZOCOR) 20 MG tablet Take 1 tablet (20 mg total) by mouth daily at 6 PM. 90 tablet 3   No current facility-administered medications for this visit.    Allergies  Allergen Reactions  . Cefuroxime     Might cause hives  . Cephalosporins     Might cause hives   . Latex Nausea Only  . Celexa [Citalopram Hydrobromide] Nausea Only  . Dexlansoprazole Anxiety    Sleeplessness heart racing headaches chiulls  . Lexapro [Escitalopram Oxalate] Nausea Only  . Penicillins Hives  . Sulfonamide Derivatives Hives  . Vancomycin Rash     Past Medical History  Diagnosis Date  . HYPERLIPIDEMIA 02/09/2010  . HYPERTENSION 02/09/2010  . GERD 02/09/2010  . OCCIPITAL NEURALGIA 06/09/2010  . BACK PAIN, CHRONIC 02/09/2010  . OSTEOPENIA 02/09/2010  . COLONIC POLYPS, HX OF  02/09/2010  . Lumbar disc disease   . DEPRESSION 02/09/2010    pt. takes Cymbalta for pain issues   . COMMON MIGRAINE 02/09/2010    neuritis in R side of neck & head  . Neuromuscular disorder     neuritis in head & neck  . DEGENERATIVE JOINT DISEASE 02/09/2010    hands , lumbar stenosis   . Varicose vein of leg     BLE  . Pneumonia     "as an infant" (05/15/2012)  . Anemia 1948?  Marland Kitchen Allergic rhinitis, cause unspecified 01/29/2013  . Cancer     SKIN  . Cataract     Past Surgical History  Procedure Laterality Date  . Chymopapain injection  1986    Lumbar  . Lumbar laminectomy  1986  . Ovarian cyst removal  2006  . Hernia repair  05/15/2012    ventral incisional   . Cholecystectomy  ? 2009  . Varicose vein surgery  ~ 2008    bilaterally  . Breast biopsy  1988    left; Neg  . Tubal ligation  1970's  . Dilation and curettage of uterus  1960's  . Ventral hernia repair  05/15/2012    Procedure: LAPAROSCOPIC VENTRAL HERNIA;  Surgeon: Gayland Curry, MD,FACS;  Location: [facility];  Service: General;  Laterality: N/A;  laparoscopic ventral incisional hernia repair with mesh  . Colonoscopy    . Lumbar fusion  05/22/2013    L2  L3 decompression /fusion      Dr Lynann Bologna  . Transforaminal lumbar interbody fusion (tlif) with pedicle screw fixation 1 level Left 05/22/2013    Procedure: TRANSFORAMINAL LUMBAR INTERBODY FUSION (TLIF) WITH PEDICLE SCREW FIXATION 1 LEVEL;  Surgeon: Sinclair Ship, MD;  Location: [facility];  Service: Orthopedics;  Laterality: Left;  Left sided lumbar 2-3 Transforaminal lumbar interbody fusion with instrumentation, allograft    History   Social History  . Marital Status: Married    Spouse Name: N/A  . Number of Children: 3  . Years of Education: N/A   Occupational History  . Not on file.   Social History Main Topics  . Smoking status: Former Smoker -- 1.00 packs/day for 10 years    Types: Cigarettes    Quit date: 05/11/1971  . Smokeless tobacco: Never  Used  . Alcohol Use: No     Comment: 05/15/2012 "wine 2-4 glasses/wk"  . Drug Use: No  . Sexual Activity: Not Currently   Other Topics Concern  . Not on file   Social History Narrative   Coffee daily     Family History  Problem Relation Age of Onset  . Lung cancer Mother   . Cancer Mother     Lung  . COPD Mother   . Varicose Veins Mother   . Diabetes Father   . Breast cancer Maternal Grandmother   . Cancer Maternal Grandmother     breast  . Colon cancer Maternal Aunt   . Cancer Son     Melanoma    ROS: chronic back pain but no fevers or chills, productive cough, hemoptysis, dysphasia, odynophagia, melena, hematochezia, dysuria, hematuria, rash, seizure activity, orthopnea, PND, pedal edema, claudication. Remaining systems are negative.  Physical Exam:   Blood pressure 158/88, pulse 80.  General:  Well developed/well nourished in NAD Skin warm/dry Patient not depressed No peripheral clubbing Back-normal HEENT-normal/normal eyelids Neck supple/normal carotid upstroke bilaterally; no bruits; no JVD; no thyromegaly chest - CTA/ normal expansion CV - RRR/normal S1 and S2; no murmurs, rubs or gallops;  PMI nondisplaced Abdomen -NT/ND, no HSM, no mass, + bowel sounds, no bruit 2+ femoral pulses, no bruits Ext-no edema, chords, 2+ DP Neuro-grossly nonfocal  ECG normal sinus rhythm at a rate of 80. Biatrial enlargement.

## 2015-02-09 ENCOUNTER — Ambulatory Visit (INDEPENDENT_AMBULATORY_CARE_PROVIDER_SITE_OTHER): Payer: Medicare Other | Admitting: Cardiology

## 2015-02-09 ENCOUNTER — Encounter: Payer: Self-pay | Admitting: Cardiology

## 2015-02-09 VITALS — BP 158/88 | HR 80

## 2015-02-09 DIAGNOSIS — I10 Essential (primary) hypertension: Secondary | ICD-10-CM

## 2015-02-09 DIAGNOSIS — R002 Palpitations: Secondary | ICD-10-CM | POA: Diagnosis not present

## 2015-02-09 NOTE — Assessment & Plan Note (Signed)
Patient had an episode of palpitations after a stressful situation. Etiology unclear but no recurrent events. Electrocardiogram shows sinus rhythm with biatrial enlargement. Echocardiogram shows normal LV function. I do not think we need to pursue further evaluation at this point. However she has recurrent palpitations we will schedule a monitor to more fully assess.

## 2015-02-09 NOTE — Assessment & Plan Note (Signed)
Continue statin. Managed by primary care.

## 2015-02-09 NOTE — Patient Instructions (Signed)
Your physician wants you to follow-up in: 3 MONTHS WITH DR CRENSHAW You will receive a reminder letter in the mail two months in advance. If you don't receive a letter, please call our office to schedule the follow-up appointment.  

## 2015-02-09 NOTE — Assessment & Plan Note (Signed)
Blood pressure mildly elevated. She will follow this and medications can be adjusted by primary care as needed.

## 2015-02-23 ENCOUNTER — Other Ambulatory Visit: Payer: Self-pay | Admitting: *Deleted

## 2015-02-23 ENCOUNTER — Ambulatory Visit
Admission: RE | Admit: 2015-02-23 | Discharge: 2015-02-23 | Disposition: A | Discharge status: Home / Self Care | Payer: Medicare Other | Source: Ambulatory Visit | Attending: *Deleted | Admitting: *Deleted

## 2015-02-23 DIAGNOSIS — M5126 Other intervertebral disc displacement, lumbar region: Secondary | ICD-10-CM | POA: Diagnosis not present

## 2015-02-23 DIAGNOSIS — Z462 Encounter for fitting and adjustment of other devices related to nervous system and special senses: Secondary | ICD-10-CM | POA: Diagnosis not present

## 2015-02-23 DIAGNOSIS — Z139 Encounter for screening, unspecified: Secondary | ICD-10-CM

## 2015-02-26 DIAGNOSIS — M5416 Radiculopathy, lumbar region: Secondary | ICD-10-CM | POA: Diagnosis not present

## 2015-02-26 DIAGNOSIS — G894 Chronic pain syndrome: Secondary | ICD-10-CM | POA: Diagnosis not present

## 2015-02-26 DIAGNOSIS — M533 Sacrococcygeal disorders, not elsewhere classified: Secondary | ICD-10-CM | POA: Diagnosis not present

## 2015-03-11 DIAGNOSIS — Z79899 Other long term (current) drug therapy: Secondary | ICD-10-CM | POA: Diagnosis not present

## 2015-03-11 DIAGNOSIS — K219 Gastro-esophageal reflux disease without esophagitis: Secondary | ICD-10-CM | POA: Diagnosis not present

## 2015-03-11 DIAGNOSIS — I10 Essential (primary) hypertension: Secondary | ICD-10-CM | POA: Diagnosis not present

## 2015-03-11 DIAGNOSIS — Z87891 Personal history of nicotine dependence: Secondary | ICD-10-CM | POA: Diagnosis not present

## 2015-03-11 DIAGNOSIS — E780 Pure hypercholesterolemia: Secondary | ICD-10-CM | POA: Diagnosis not present

## 2015-03-11 DIAGNOSIS — T404X5A Adverse effect of other synthetic narcotics, initial encounter: Secondary | ICD-10-CM | POA: Diagnosis not present

## 2015-03-11 DIAGNOSIS — R112 Nausea with vomiting, unspecified: Secondary | ICD-10-CM | POA: Diagnosis not present

## 2015-03-11 DIAGNOSIS — Z880 Allergy status to penicillin: Secondary | ICD-10-CM | POA: Diagnosis not present

## 2015-03-11 DIAGNOSIS — Z882 Allergy status to sulfonamides status: Secondary | ICD-10-CM | POA: Diagnosis not present

## 2015-03-11 DIAGNOSIS — Z888 Allergy status to other drugs, medicaments and biological substances status: Secondary | ICD-10-CM | POA: Diagnosis not present

## 2015-03-13 DIAGNOSIS — M533 Sacrococcygeal disorders, not elsewhere classified: Secondary | ICD-10-CM | POA: Diagnosis not present

## 2015-04-09 DIAGNOSIS — M5416 Radiculopathy, lumbar region: Secondary | ICD-10-CM | POA: Diagnosis not present

## 2015-04-09 DIAGNOSIS — M47812 Spondylosis without myelopathy or radiculopathy, cervical region: Secondary | ICD-10-CM | POA: Diagnosis not present

## 2015-04-09 DIAGNOSIS — G894 Chronic pain syndrome: Secondary | ICD-10-CM | POA: Diagnosis not present

## 2015-04-10 DIAGNOSIS — M47812 Spondylosis without myelopathy or radiculopathy, cervical region: Secondary | ICD-10-CM | POA: Diagnosis not present

## 2015-04-14 ENCOUNTER — Ambulatory Visit: Payer: Medicare Other | Admitting: Internal Medicine

## 2015-05-07 DIAGNOSIS — G894 Chronic pain syndrome: Secondary | ICD-10-CM | POA: Diagnosis not present

## 2015-05-07 DIAGNOSIS — M5416 Radiculopathy, lumbar region: Secondary | ICD-10-CM | POA: Diagnosis not present

## 2015-05-08 ENCOUNTER — Other Ambulatory Visit: Payer: Self-pay | Admitting: *Deleted

## 2015-05-08 ENCOUNTER — Ambulatory Visit
Admission: RE | Admit: 2015-05-08 | Discharge: 2015-05-08 | Disposition: A | Discharge status: Home / Self Care | Payer: Medicare Other | Source: Ambulatory Visit | Attending: *Deleted | Admitting: *Deleted

## 2015-05-08 ENCOUNTER — Ambulatory Visit (INDEPENDENT_AMBULATORY_CARE_PROVIDER_SITE_OTHER): Payer: Medicare Other

## 2015-05-08 DIAGNOSIS — M542 Cervicalgia: Secondary | ICD-10-CM

## 2015-05-08 DIAGNOSIS — Z23 Encounter for immunization: Secondary | ICD-10-CM | POA: Diagnosis not present

## 2015-05-08 DIAGNOSIS — M5031 Other cervical disc degeneration,  high cervical region: Secondary | ICD-10-CM | POA: Diagnosis not present

## 2015-05-11 DIAGNOSIS — D124 Benign neoplasm of descending colon: Secondary | ICD-10-CM | POA: Diagnosis not present

## 2015-05-11 DIAGNOSIS — Z86010 Personal history of colonic polyps: Secondary | ICD-10-CM | POA: Diagnosis not present

## 2015-05-13 DIAGNOSIS — Z85828 Personal history of other malignant neoplasm of skin: Secondary | ICD-10-CM | POA: Diagnosis not present

## 2015-05-13 DIAGNOSIS — L821 Other seborrheic keratosis: Secondary | ICD-10-CM | POA: Diagnosis not present

## 2015-05-13 DIAGNOSIS — L820 Inflamed seborrheic keratosis: Secondary | ICD-10-CM | POA: Diagnosis not present

## 2015-05-13 DIAGNOSIS — Z08 Encounter for follow-up examination after completed treatment for malignant neoplasm: Secondary | ICD-10-CM | POA: Diagnosis not present

## 2015-05-18 DIAGNOSIS — Z1231 Encounter for screening mammogram for malignant neoplasm of breast: Secondary | ICD-10-CM | POA: Diagnosis not present

## 2015-05-29 DIAGNOSIS — H2513 Age-related nuclear cataract, bilateral: Secondary | ICD-10-CM | POA: Diagnosis not present

## 2015-06-02 DIAGNOSIS — M533 Sacrococcygeal disorders, not elsewhere classified: Secondary | ICD-10-CM | POA: Diagnosis not present

## 2015-06-03 DIAGNOSIS — M533 Sacrococcygeal disorders, not elsewhere classified: Secondary | ICD-10-CM | POA: Diagnosis not present

## 2015-06-08 DIAGNOSIS — R1013 Epigastric pain: Secondary | ICD-10-CM | POA: Diagnosis not present

## 2015-07-07 DIAGNOSIS — M5412 Radiculopathy, cervical region: Secondary | ICD-10-CM | POA: Diagnosis not present

## 2015-07-23 DIAGNOSIS — M5412 Radiculopathy, cervical region: Secondary | ICD-10-CM | POA: Diagnosis not present

## 2015-07-28 ENCOUNTER — Other Ambulatory Visit: Payer: Self-pay | Admitting: Internal Medicine

## 2015-07-28 NOTE — Telephone Encounter (Signed)
Done hardcopy to Dahlia  

## 2015-07-28 NOTE — Telephone Encounter (Signed)
Rx faxed to pharmacy  

## 2015-08-15 DIAGNOSIS — R109 Unspecified abdominal pain: Secondary | ICD-10-CM | POA: Diagnosis not present

## 2015-09-01 ENCOUNTER — Other Ambulatory Visit: Payer: Self-pay | Admitting: Internal Medicine

## 2015-09-02 DIAGNOSIS — M1712 Unilateral primary osteoarthritis, left knee: Secondary | ICD-10-CM | POA: Diagnosis not present

## 2015-09-02 DIAGNOSIS — M5481 Occipital neuralgia: Secondary | ICD-10-CM | POA: Diagnosis not present

## 2015-09-03 DIAGNOSIS — M5416 Radiculopathy, lumbar region: Secondary | ICD-10-CM | POA: Diagnosis not present

## 2015-09-08 DIAGNOSIS — M5481 Occipital neuralgia: Secondary | ICD-10-CM | POA: Diagnosis not present

## 2015-09-17 DIAGNOSIS — K449 Diaphragmatic hernia without obstruction or gangrene: Secondary | ICD-10-CM | POA: Diagnosis not present

## 2015-09-17 DIAGNOSIS — R1033 Periumbilical pain: Secondary | ICD-10-CM | POA: Diagnosis not present

## 2015-09-17 DIAGNOSIS — R1013 Epigastric pain: Secondary | ICD-10-CM | POA: Diagnosis not present

## 2015-09-18 DIAGNOSIS — K229 Disease of esophagus, unspecified: Secondary | ICD-10-CM | POA: Diagnosis not present

## 2015-09-18 DIAGNOSIS — R109 Unspecified abdominal pain: Secondary | ICD-10-CM | POA: Diagnosis not present

## 2015-09-25 DIAGNOSIS — M461 Sacroiliitis, not elsewhere classified: Secondary | ICD-10-CM | POA: Diagnosis not present

## 2015-09-25 DIAGNOSIS — M47817 Spondylosis without myelopathy or radiculopathy, lumbosacral region: Secondary | ICD-10-CM | POA: Diagnosis not present

## 2015-10-06 DIAGNOSIS — M461 Sacroiliitis, not elsewhere classified: Secondary | ICD-10-CM | POA: Diagnosis not present

## 2015-10-06 DIAGNOSIS — M47817 Spondylosis without myelopathy or radiculopathy, lumbosacral region: Secondary | ICD-10-CM | POA: Diagnosis not present

## 2015-10-19 DIAGNOSIS — M5416 Radiculopathy, lumbar region: Secondary | ICD-10-CM | POA: Diagnosis not present

## 2015-10-19 DIAGNOSIS — M461 Sacroiliitis, not elsewhere classified: Secondary | ICD-10-CM | POA: Diagnosis not present

## 2015-10-19 DIAGNOSIS — M47817 Spondylosis without myelopathy or radiculopathy, lumbosacral region: Secondary | ICD-10-CM | POA: Diagnosis not present

## 2015-10-21 IMAGING — CR DG LUMBAR SPINE 2-3V
2 series · 2 of 2 positions shown · non-contrast
Comparison: Thoracic spine series of today's date

CLINICAL DATA: Bilateral lower extremity discomfort when turning on
the nerve stimulator electrode several weeks after placement.

EXAM:
LUMBAR SPINE - 2-3 VIEW

[w lumbar spine ap]
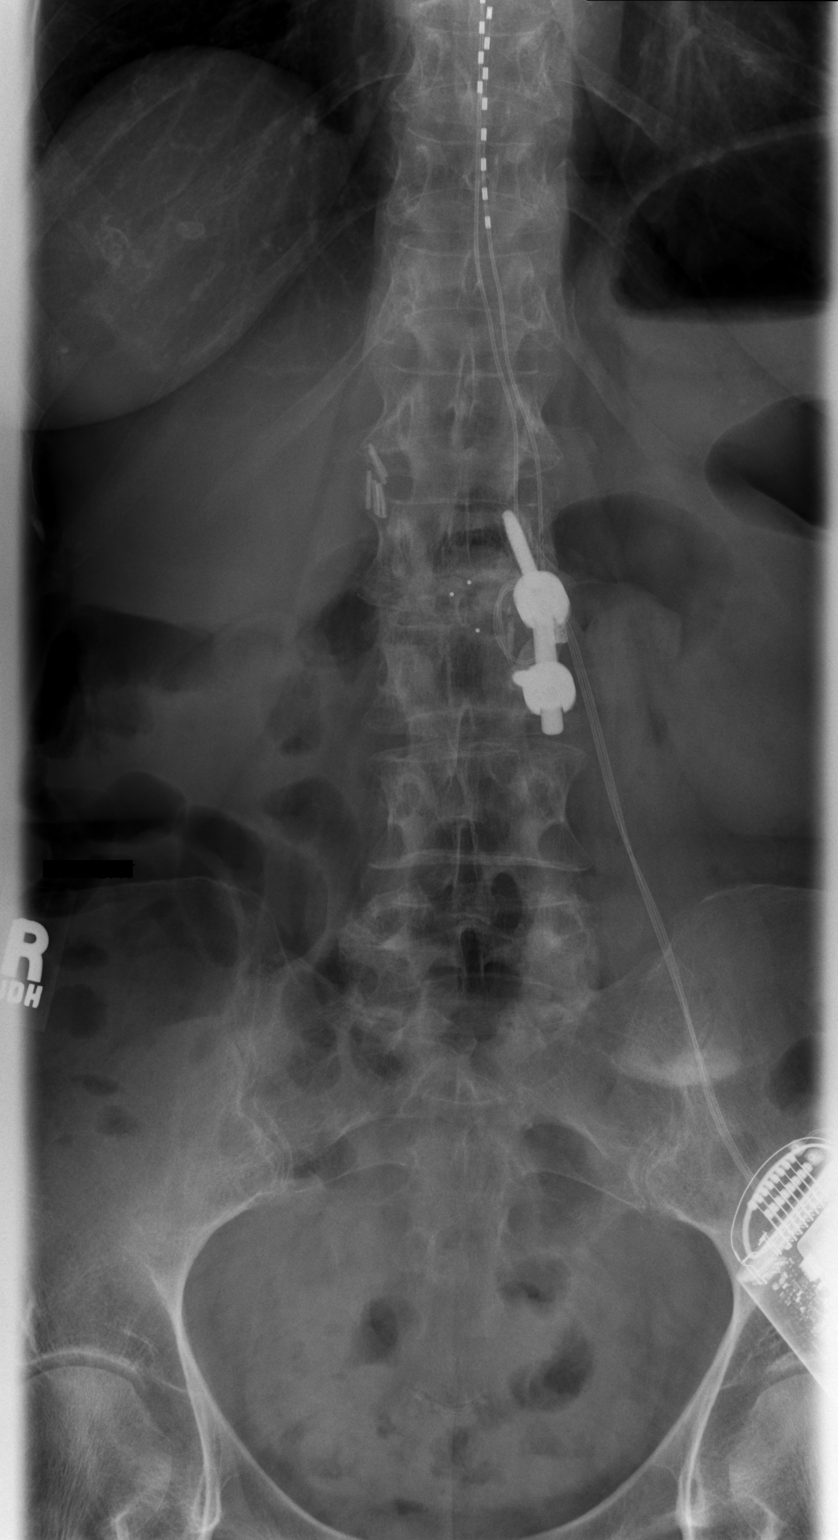

[w lumbar spine lat]
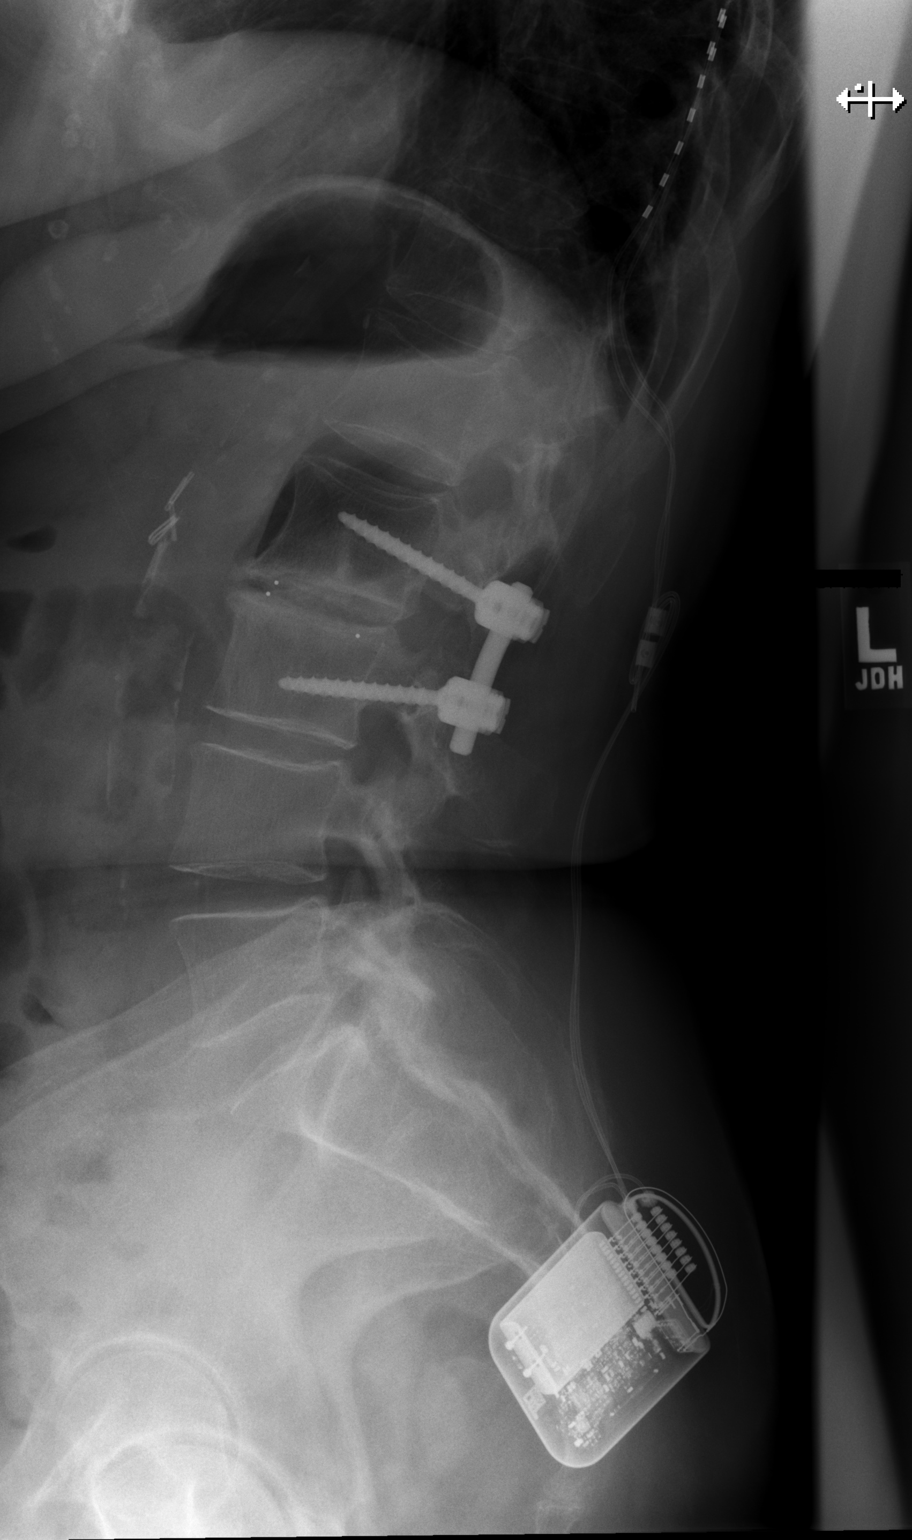

[2 of 2 positions shown; findings below may reference images not displayed]

FINDINGS: The patient has undergone previous left lateral fusion across the
L2-3 disc space with intradiscal device placement. The metallic
hardware appears intact. The thoracic vertebral bodies are preserved
in height. There is disc space narrowing at L2-3 and at L3-4. There
is no spondylolisthesis. The generator appears in reasonable
position over the left buttock region.
IMPRESSION: There is no acute bony abnormality of the lumbar spine. The
visualized metallic hardware is intact. The nerve stimulator
electrodes appear to be in reasonable position.

## 2015-11-02 DIAGNOSIS — K219 Gastro-esophageal reflux disease without esophagitis: Secondary | ICD-10-CM | POA: Diagnosis not present

## 2015-11-02 DIAGNOSIS — R1013 Epigastric pain: Secondary | ICD-10-CM | POA: Diagnosis not present

## 2015-12-14 DIAGNOSIS — Z79899 Other long term (current) drug therapy: Secondary | ICD-10-CM | POA: Diagnosis not present

## 2015-12-14 DIAGNOSIS — R1013 Epigastric pain: Secondary | ICD-10-CM | POA: Diagnosis not present

## 2015-12-14 DIAGNOSIS — K219 Gastro-esophageal reflux disease without esophagitis: Secondary | ICD-10-CM | POA: Diagnosis not present

## 2015-12-14 DIAGNOSIS — Z86010 Personal history of colonic polyps: Secondary | ICD-10-CM | POA: Diagnosis not present

## 2016-01-07 DIAGNOSIS — M461 Sacroiliitis, not elsewhere classified: Secondary | ICD-10-CM | POA: Diagnosis not present

## 2016-03-15 ENCOUNTER — Telehealth: Payer: Self-pay | Admitting: Internal Medicine

## 2016-03-15 DIAGNOSIS — R1013 Epigastric pain: Secondary | ICD-10-CM | POA: Diagnosis not present

## 2016-03-15 DIAGNOSIS — K2900 Acute gastritis without bleeding: Secondary | ICD-10-CM | POA: Diagnosis not present

## 2016-03-15 NOTE — Telephone Encounter (Signed)
Rec'd from [facility] forward 2 pages to [place]

## 2016-04-29 ENCOUNTER — Ambulatory Visit (INDEPENDENT_AMBULATORY_CARE_PROVIDER_SITE_OTHER): Payer: Medicare Other

## 2016-04-29 DIAGNOSIS — Z23 Encounter for immunization: Secondary | ICD-10-CM | POA: Diagnosis not present

## 2016-05-20 DIAGNOSIS — Z1231 Encounter for screening mammogram for malignant neoplasm of breast: Secondary | ICD-10-CM | POA: Diagnosis not present

## 2016-05-23 DIAGNOSIS — Z08 Encounter for follow-up examination after completed treatment for malignant neoplasm: Secondary | ICD-10-CM | POA: Diagnosis not present

## 2016-05-23 DIAGNOSIS — L570 Actinic keratosis: Secondary | ICD-10-CM | POA: Diagnosis not present

## 2016-05-23 DIAGNOSIS — Z85828 Personal history of other malignant neoplasm of skin: Secondary | ICD-10-CM | POA: Diagnosis not present

## 2016-06-02 DIAGNOSIS — H2513 Age-related nuclear cataract, bilateral: Secondary | ICD-10-CM | POA: Diagnosis not present

## 2016-06-09 DIAGNOSIS — R1013 Epigastric pain: Secondary | ICD-10-CM | POA: Diagnosis not present

## 2016-06-09 DIAGNOSIS — R938 Abnormal findings on diagnostic imaging of other specified body structures: Secondary | ICD-10-CM | POA: Diagnosis not present

## 2016-06-09 DIAGNOSIS — K219 Gastro-esophageal reflux disease without esophagitis: Secondary | ICD-10-CM | POA: Diagnosis not present

## 2016-06-13 ENCOUNTER — Telehealth: Payer: Self-pay | Admitting: Internal Medicine

## 2016-06-13 NOTE — Telephone Encounter (Signed)
Rec'd from [facility] forward 3 pages to Dr.James Jenny Reichmann

## 2016-06-13 NOTE — Telephone Encounter (Signed)
Rec'd from [facility] forward 2 pages to Dr. Cathlean Cower

## 2016-06-21 DIAGNOSIS — Z01419 Encounter for gynecological examination (general) (routine) without abnormal findings: Secondary | ICD-10-CM | POA: Diagnosis not present

## 2016-07-06 DIAGNOSIS — M25561 Pain in right knee: Secondary | ICD-10-CM | POA: Diagnosis not present

## 2016-07-06 DIAGNOSIS — M1711 Unilateral primary osteoarthritis, right knee: Secondary | ICD-10-CM | POA: Diagnosis not present

## 2016-07-07 DIAGNOSIS — M1711 Unilateral primary osteoarthritis, right knee: Secondary | ICD-10-CM | POA: Diagnosis not present

## 2016-07-07 DIAGNOSIS — M1712 Unilateral primary osteoarthritis, left knee: Secondary | ICD-10-CM | POA: Diagnosis not present

## 2016-07-12 DIAGNOSIS — R938 Abnormal findings on diagnostic imaging of other specified body structures: Secondary | ICD-10-CM | POA: Diagnosis not present

## 2016-07-12 DIAGNOSIS — M1711 Unilateral primary osteoarthritis, right knee: Secondary | ICD-10-CM | POA: Diagnosis not present

## 2016-07-14 DIAGNOSIS — M1711 Unilateral primary osteoarthritis, right knee: Secondary | ICD-10-CM | POA: Diagnosis not present

## 2016-07-28 ENCOUNTER — Telehealth: Payer: Self-pay | Admitting: Internal Medicine

## 2016-07-28 NOTE — Telephone Encounter (Signed)
Attempted to call patient to schedule awv. Patient did not answer. Will attempted to call patient at a later date.

## 2016-08-01 DIAGNOSIS — M1711 Unilateral primary osteoarthritis, right knee: Secondary | ICD-10-CM | POA: Diagnosis not present

## 2016-08-01 DIAGNOSIS — I10 Essential (primary) hypertension: Secondary | ICD-10-CM | POA: Diagnosis not present

## 2016-08-03 DIAGNOSIS — M6281 Muscle weakness (generalized): Secondary | ICD-10-CM | POA: Diagnosis not present

## 2016-08-03 DIAGNOSIS — M25561 Pain in right knee: Secondary | ICD-10-CM | POA: Diagnosis not present

## 2016-09-26 ENCOUNTER — Telehealth: Payer: Self-pay

## 2016-09-26 MED ORDER — SIMVASTATIN 20 MG PO TABS: ORAL_TABLET | ORAL | 0 refills | Status: DC

## 2016-09-26 NOTE — Telephone Encounter (Signed)
error 

## 2016-10-09 DIAGNOSIS — R079 Chest pain, unspecified: Secondary | ICD-10-CM | POA: Diagnosis not present

## 2016-10-10 DIAGNOSIS — R079 Chest pain, unspecified: Secondary | ICD-10-CM | POA: Diagnosis not present

## 2016-10-27 ENCOUNTER — Encounter: Payer: Self-pay | Admitting: Internal Medicine

## 2016-10-27 ENCOUNTER — Other Ambulatory Visit (INDEPENDENT_AMBULATORY_CARE_PROVIDER_SITE_OTHER): Payer: Medicare Other

## 2016-10-27 ENCOUNTER — Ambulatory Visit (INDEPENDENT_AMBULATORY_CARE_PROVIDER_SITE_OTHER): Payer: Medicare Other | Admitting: Internal Medicine

## 2016-10-27 VITALS — BP 143/82 | HR 74 | Resp 20 | Ht 65.0 in | Wt 144.0 lb

## 2016-10-27 DIAGNOSIS — I10 Essential (primary) hypertension: Secondary | ICD-10-CM | POA: Diagnosis not present

## 2016-10-27 DIAGNOSIS — F32A Depression, unspecified: Secondary | ICD-10-CM

## 2016-10-27 DIAGNOSIS — Z0000 Encounter for general adult medical examination without abnormal findings: Secondary | ICD-10-CM

## 2016-10-27 DIAGNOSIS — F329 Major depressive disorder, single episode, unspecified: Secondary | ICD-10-CM | POA: Diagnosis not present

## 2016-10-27 DIAGNOSIS — Z780 Asymptomatic menopausal state: Secondary | ICD-10-CM | POA: Diagnosis not present

## 2016-10-27 DIAGNOSIS — E785 Hyperlipidemia, unspecified: Secondary | ICD-10-CM

## 2016-10-27 LAB — URINALYSIS, ROUTINE W REFLEX MICROSCOPIC
Bilirubin Urine: NEGATIVE
Ketones, ur: NEGATIVE
Leukocytes, UA: NEGATIVE
Nitrite: NEGATIVE
Specific Gravity, Urine: 1.01 (ref 1.000–1.030)
Total Protein, Urine: NEGATIVE
Urine Glucose: NEGATIVE
Urobilinogen, UA: 0.2 (ref 0.0–1.0)
pH: 6 (ref 5.0–8.0)

## 2016-10-27 LAB — CBC WITH DIFFERENTIAL/PLATELET
Basophils Absolute: 0.1 10*3/uL (ref 0.0–0.1)
Basophils Relative: 0.9 % (ref 0.0–3.0)
Eosinophils Absolute: 0 10*3/uL (ref 0.0–0.7)
Eosinophils Relative: 0.7 % (ref 0.0–5.0)
HCT: 44.8 % (ref 36.0–46.0)
Hemoglobin: 15.4 g/dL — ABNORMAL HIGH (ref 12.0–15.0)
Lymphocytes Relative: 26.7 % (ref 12.0–46.0)
Lymphs Abs: 1.9 10*3/uL (ref 0.7–4.0)
MCHC: 34.3 g/dL (ref 30.0–36.0)
MCV: 94.1 fl (ref 78.0–100.0)
Monocytes Absolute: 0.5 10*3/uL (ref 0.1–1.0)
Monocytes Relative: 7.3 % (ref 3.0–12.0)
Neutro Abs: 4.6 10*3/uL (ref 1.4–7.7)
Neutrophils Relative %: 64.4 % (ref 43.0–77.0)
Platelets: 277 10*3/uL (ref 150.0–400.0)
RBC: 4.76 Mil/uL (ref 3.87–5.11)
RDW: 13.6 % (ref 11.5–15.5)
WBC: 7.1 10*3/uL (ref 4.0–10.5)

## 2016-10-27 LAB — LIPID PANEL
Cholesterol: 175 mg/dL (ref 0–200)
HDL: 62.9 mg/dL (ref >39.00)
LDL Cholesterol: 98 mg/dL (ref 0–99)
NonHDL: 112.02
Total CHOL/HDL Ratio: 3
Triglycerides: 70 mg/dL (ref 0.0–149.0)
VLDL: 14 mg/dL (ref 0.0–40.0)

## 2016-10-27 LAB — BASIC METABOLIC PANEL
BUN: 13 mg/dL (ref 6–23)
CO2: 28 mEq/L (ref 19–32)
Calcium: 10.1 mg/dL (ref 8.4–10.5)
Chloride: 103 mEq/L (ref 96–112)
Creatinine, Ser: 0.76 mg/dL (ref 0.40–1.20)
GFR: 78.53 mL/min (ref >60.00)
Glucose, Bld: 103 mg/dL — ABNORMAL HIGH (ref 70–99)
Potassium: 4.5 mEq/L (ref 3.5–5.1)
Sodium: 140 mEq/L (ref 135–145)

## 2016-10-27 LAB — HEPATIC FUNCTION PANEL
ALT: 14 U/L (ref 0–35)
AST: 17 U/L (ref 0–37)
Albumin: 4.8 g/dL (ref 3.5–5.2)
Alkaline Phosphatase: 68 U/L (ref 39–117)
Bilirubin, Direct: 0.1 mg/dL (ref 0.0–0.3)
Total Bilirubin: 0.5 mg/dL (ref 0.2–1.2)
Total Protein: 7.4 g/dL (ref 6.0–8.3)

## 2016-10-27 LAB — TSH: TSH: 1.6 u[IU]/mL (ref 0.35–4.50)

## 2016-10-27 MED ORDER — CARISOPRODOL 350 MG PO TABS: 350.0000 mg | ORAL_TABLET | Freq: Every day | ORAL | 1 refills | Status: DC

## 2016-10-27 NOTE — Progress Notes (Signed)
Pre visit review using our clinic review tool, if applicable. No additional management support is needed unless otherwise documented below in the visit note. 

## 2016-10-27 NOTE — Progress Notes (Signed)
Subjective:   Crystal Herrera is a 76 y.o. female who presents for an Initial Medicare Annual Wellness Visit.  Review of Systems    No ROS.  Medicare Wellness Visit.   Cardiac Risk Factors include: dyslipidemia;hypertension;advanced age (>68men, >72 women) Sleep patterns: no sleep issues, feels rested on waking and sleeps 6 hours nightly.   Home Safety/Smoke Alarms:  Feels safe in home. Smoke alarms in place.   Living environment; residence and Firearm Safety: 2-story house, no firearms.Lives with husband, no DME needed at this time. Seat Belt Safety/Bike Helmet: Wears seat belt.   Counseling:   Eye Exam- appointment yearly Dr. Jodi Mourning  Dental- appointment every 6 months, Dr. Aubery Lapping  Female:   Pap- N/A      Mammo- Last 04/24/16      Dexa scan- none, referral placed today       CCS- Last 07/26/07       Objective:    Today's Vitals   10/27/16 0912  BP: (!) 143/82  Pulse: 74  Resp: 20  SpO2: 99%  Weight: 144 lb (65.3 kg)  Height: 5\' 5"  (1.651 m)   Body mass index is 23.96 kg/m.   Current Medications (verified) Outpatient Encounter Prescriptions as of 10/27/2016  Medication Sig  . acetaminophen (TYLENOL) 500 MG tablet Take 1,000 mg by mouth every 6 (six) hours as needed for pain.  . calcium gluconate 500 MG tablet Take 500 mg by mouth 2 (two) times daily.    . carisoprodol (SOMA) 350 MG tablet TAKE 1 TABLET AT BEDTIME  . hydrochlorothiazide (MICROZIDE) 12.5 MG capsule TAKE 1 CAPSULE EVERY DAY  . meclizine (ANTIVERT) 25 MG tablet Take 25 mg by mouth 3 (three) times daily as needed for dizziness.  Marland Kitchen omeprazole (PRILOSEC) 20 MG capsule Take 1 capsule (20 mg total) by mouth daily.  . simvastatin (ZOCOR) 20 MG tablet TAKE 1 TABLET EVERY DAY  AT  6PM   No facility-administered encounter medications on file as of 10/27/2016.     Allergies (verified) Cefuroxime; Cephalosporins; Latex; Celexa [citalopram hydrobromide]; Dexlansoprazole; Lexapro [escitalopram oxalate];  Penicillins; Sulfonamide derivatives; and Vancomycin   History: Past Medical History:  Diagnosis Date  . Allergic rhinitis, cause unspecified 01/29/2013  . Anemia 1948?  Marland Kitchen BACK PAIN, CHRONIC 02/09/2010  . Cancer ([place])    SKIN  . Cataract   . COLONIC POLYPS, HX OF 02/09/2010  . COMMON MIGRAINE 02/09/2010   neuritis in R side of neck & head  . DEGENERATIVE JOINT DISEASE 02/09/2010   hands , lumbar stenosis   . DEPRESSION 02/09/2010   pt. takes Cymbalta for pain issues   . GERD 02/09/2010  . HYPERLIPIDEMIA 02/09/2010  . HYPERTENSION 02/09/2010  . Lumbar disc disease   . Neuromuscular disorder ([place])    neuritis in head & neck  . OCCIPITAL NEURALGIA 06/09/2010  . OSTEOPENIA 02/09/2010  . Pneumonia    "as an infant" (05/15/2012)  . Varicose vein of leg    BLE   Past Surgical History:  Procedure Laterality Date  . BREAST BIOPSY  1988   left; Neg  . CHOLECYSTECTOMY  ? 2009  . chymopapain injection  1986   Lumbar  . COLONOSCOPY    . DILATION AND CURETTAGE OF UTERUS  1960's  . HERNIA REPAIR  05/15/2012   ventral incisional   . LUMBAR FUSION  05/22/2013   L2  L3 decompression /fusion      Dr Lynann Bologna  . LUMBAR LAMINECTOMY  1986  . OVARIAN CYST REMOVAL  2006  . Stimulator placed   07/2014   placed 2 years ago for pain  . TRANSFORAMINAL LUMBAR INTERBODY FUSION (TLIF) WITH PEDICLE SCREW FIXATION 1 LEVEL Left 05/22/2013   Procedure: TRANSFORAMINAL LUMBAR INTERBODY FUSION (TLIF) WITH PEDICLE SCREW FIXATION 1 LEVEL;  Surgeon: Sinclair Ship, MD;  Location: [facility];  Service: Orthopedics;  Laterality: Left;  Left sided lumbar 2-3 Transforaminal lumbar interbody fusion with instrumentation, allograft  . TUBAL LIGATION  1970's  . VARICOSE VEIN SURGERY  ~ 2008   bilaterally  . VENTRAL HERNIA REPAIR  05/15/2012   Procedure: LAPAROSCOPIC VENTRAL HERNIA;  Surgeon: Gayland Curry, MD,FACS;  Location[facility];  Service: General;  Laterality: N/A;  laparoscopic ventral incisional hernia repair with  mesh   Family History  Problem Relation Age of Onset  . Lung cancer Mother   . Cancer Mother     Lung  . COPD Mother   . Varicose Veins Mother   . Diabetes Father   . Cancer Son     Melanoma  . Breast cancer Maternal Grandmother   . Cancer Maternal Grandmother     breast  . Colon cancer Maternal Aunt    Social History   Occupational History  . Not on file.   Social History Main Topics  . Smoking status: Former Smoker    Packs/day: 1.00    Years: 10.00    Types: Cigarettes    Quit date: 05/11/1971  . Smokeless tobacco: Never Used  . Alcohol use No     Comment: 05/15/2012 "wine 2-4 glasses/wk"  . Drug use: No  . Sexual activity: Not Currently    Tobacco Counseling Counseling given: Not Answered   Activities of Daily Living In your present state of health, do you have any difficulty performing the following activities: 10/27/2016  Hearing? Y  Vision? N  Difficulty concentrating or making decisions? Y  Walking or climbing stairs? N  Dressing or bathing? N  Doing errands, shopping? N  Preparing Food and eating ? N  Using the Toilet? N  In the past six months, have you accidently leaked urine? N  Do you have problems with loss of bowel control? N  Managing your Medications? N  Managing your Finances? N  Housekeeping or managing your Housekeeping? N  Some recent data might be hidden    Immunizations and Health Maintenance Immunization History  Administered Date(s) Administered  . Influenza Split 05/24/2011  . Influenza Whole 06/09/2010  . Influenza, High Dose Seasonal PF 04/29/2016  . Influenza,inj,Quad PF,36+ Mos 04/08/2014, 05/08/2015  . Influenza-Unspecified 04/24/2013  . Pneumococcal Conjugate-13 10/04/2013  . Pneumococcal Polysaccharide-23 07/25/2005  . Td 01/22/2002  . Tetanus 10/04/2013   Health Maintenance Due  Topic Date Due  . DEXA SCAN  04/11/2005    Patient Care Team: Biagio Borg, MD as PCP - General Milus Banister, MD as Attending  Physician (Gastroenterology)  Indicate any recent Medical Services you may have received from other than [facility] providers in the past year (date may be approximate).     Assessment:   This is a routine wellness examination for Crystal Herrera. Physical assessment deferred to PCP.   Hearing/Vision screen Hearing Screening Comments: HOH, followed by an audiologist Vision Screening Comments: Wears glasses, catarcts  Dietary issues and exercise activities discussed: Current Exercise Habits: Structured exercise class, Type of exercise: Other - see comments (swims), Time (Minutes): 20, Frequency (Times/Week): 3, Weekly Exercise (Minutes/Week): 60, Intensity: Mild, Exercise limited by: orthopedic condition(s)  Diet (meal preparation, eat  out, water intake, caffeinated beverages, dairy products, fruits and vegetables): in general, a "healthy" diet  , low fat/ cholesterol, low salt  Patient reports maintaining heart healthy diet, eats a variety of fruits and vegetables, limits sugar drinks, no coffee, drinks decaffeinated tea occasionally, drinks 6-8 glasses of water daily.    Goals    . Start to walk in my neighborhood          I will begin to walk a block and back and increase distance as tolerated.      Depression Screen PHQ 2/9 Scores 10/27/2016 01/14/2015 04/08/2014 10/04/2013  PHQ - 2 Score 0 1 1 1     Fall Risk Fall Risk  10/27/2016 01/14/2015 04/08/2014 10/04/2013  Falls in the past year? No No No No    Cognitive Function: MMSE - Mini Mental State Exam 10/27/2016  Orientation to time 5  Orientation to Place 5  Registration 3  Attention/ Calculation 5  Recall 1  Language- name 2 objects 2  Language- repeat 1  Language- follow 3 step command 3  Language- read & follow direction 1  Write a sentence 1  Copy design 1  Total score 28        Screening Tests Health Maintenance  Topic Date Due  . DEXA SCAN  04/11/2005  . INFLUENZA VACCINE  02/22/2017  . TETANUS/TDAP  10/05/2023  . PNA vac Low  Risk Adult  Completed      Plan:    Continue to eat heart healthy diet (full of fruits, vegetables, whole grains, lean protein, water--limit salt, fat, and sugar intake) and increase physical activity as tolerated.  Continue doing brain stimulating activities (puzzles, reading, adult coloring books, staying active) to keep memory sharp.   Bring a copy of your advance directives to your next office visit.  During the course of the visit, Captain was educated and counseled about the following appropriate screening and preventive services:   Vaccines to include Pneumoccal, Influenza, Hepatitis B, Td, Zostavax, HCV  Cardiovascular disease screening  Colorectal cancer screening  Bone density screening  Diabetes screening  Glaucoma screening  Mammography/PAP  Nutrition counseling  Patient Instructions (the written plan) were given to the patient.    Michiel Cowboy, RN   10/27/2016   Medical screening examination/treatment/procedure(s) were performed by non-physician practitioner and as supervising physician I was immediately available for consultation/collaboration. I agree with above. Cathlean Cower, MD

## 2016-10-27 NOTE — Patient Instructions (Addendum)
Continue to eat heart healthy diet (full of fruits, vegetables, whole grains, lean protein, water--limit salt, fat, and sugar intake) and increase physical activity as tolerated.  Continue doing brain stimulating activities (puzzles, reading, adult coloring books, staying active) to keep memory sharp.   Bring a copy of your advance directives to your next office visit.   Crystal Herrera , Thank you for taking time to come for your Medicare Wellness Visit. I appreciate your ongoing commitment to your health goals. Please review the following plan we discussed and let me know if I can assist you in the future.   These are the goals we discussed: Goals    . Start to walk in my neighborhood          I will begin to walk a block and back and increase distance as tolerated.       This is a list of the screening recommended for you and due dates:  Health Maintenance  Topic Date Due  . DEXA scan (bone density measurement)  04/11/2005  . Flu Shot  02/22/2017  . Tetanus Vaccine  10/05/2023  . Pneumonia vaccines  Completed    Bone Densitometry Bone densitometry is an imaging test that uses a special X-ray to measure the amount of calcium and other minerals in your bones (bone density). This test is also known as a bone mineral density test or dual-energy X-ray absorptiometry (DXA). The test can measure bone density at your hip and your spine. It is similar to having a regular X-ray. You may have this test to:  Diagnose a condition that causes weak or thin bones (osteoporosis).  Predict your risk of a broken bone (fracture).  Determine how well osteoporosis treatment is working. Tell a health care provider about:  Any allergies you have.  All medicines you are taking, including vitamins, herbs, eye drops, creams, and over-the-counter medicines.  Any problems you or family members have had with anesthetic medicines.  Any blood disorders you have.  Any surgeries you have had.  Any  medical conditions you have.  Possibility of pregnancy.  Any other medical test you had within the previous 14 days that used contrast material. What are the risks? Generally, this is a safe procedure. However, problems can occur and may include the following:  This test exposes you to a very small amount of radiation.  The risks of radiation exposure may be greater to unborn children. What happens before the procedure?  Do not take any calcium supplements for 24 hours before having the test. You can otherwise eat and drink what you usually do.  Take off all metal jewelry, eyeglasses, dental appliances, and any other metal objects. What happens during the procedure?  You may lie on an exam table. There will be an X-ray generator below you and an imaging device above you.  Other devices, such as boxes or braces, may be used to position your body properly for the scan.  You will need to lie still while the machine slowly scans your body.  The images will show up on a computer monitor. What happens after the procedure? You may need more testing at a later time. This information is not intended to replace advice given to you by your health care provider. Make sure you discuss any questions you have with your health care provider. Document Released: 08/02/2004 Document Revised: 12/17/2015 Document Reviewed: 12/19/2013 Elsevier Interactive Patient Education  2017 [place].  Per Dr Jenny Reichmann -   Please continue all  other medications as before, and refills have been done if requested.  Please have the pharmacy call with any other refills you may need.  Please continue your efforts at being more active, low cholesterol diet, and weight control.  You are otherwise up to date with prevention measures today.  Please keep your appointments with your specialists as you may have planned  Please go to the LAB in the Basement (turn left off the elevator) for the tests to be done  today  You will be contacted by phone if any changes need to be made immediately.  Otherwise, you will receive a letter about your results with an explanation, but please check with MyChart first.  Please remember to sign up for MyChart if you have not done so, as this will be important to you in the future with finding out test results, communicating by private email, and scheduling acute appointments online when needed.  Please return in 1 year for your yearly visit, or sooner if needed

## 2016-10-30 NOTE — Assessment & Plan Note (Signed)
stable overall by history and exam, recent data reviewed with pt, and pt to continue medical treatment as before,  to f/u any worsening symptoms or concerns Lab Results  Component Value Date   WBC 7.1 10/27/2016   HGB 15.4 (H) 10/27/2016   HCT 44.8 10/27/2016   PLT 277.0 10/27/2016   GLUCOSE 103 (H) 10/27/2016   CHOL 175 10/27/2016   TRIG 70.0 10/27/2016   HDL 62.90 10/27/2016   LDLDIRECT 122.2 02/09/2010   LDLCALC 98 10/27/2016   ALT 14 10/27/2016   AST 17 10/27/2016   NA 140 10/27/2016   K 4.5 10/27/2016   CL 103 10/27/2016   CREATININE 0.76 10/27/2016   BUN 13 10/27/2016   CO2 28 10/27/2016   TSH 1.60 10/27/2016   INR 0.97 05/22/2013

## 2016-10-30 NOTE — Assessment & Plan Note (Signed)
stable overall by history and exam, recent data reviewed with pt, and pt to continue medical treatment as before,  to f/u any worsening symptoms or concerns Lab Results  Component Value Date   LDLCALC 98 10/27/2016

## 2016-10-30 NOTE — Progress Notes (Signed)
Subjective:    Patient ID: Crystal Herrera, female    DOB: Aug 04, 1940, 76 y.o.   MRN: [id number]  HPI  Here to f/u; overall doing ok,  Pt denies chest pain, increasing sob or doe, wheezing, orthopnea, PND, increased LE swelling, palpitations, dizziness or syncope.  Pt denies new neurological symptoms such as new headache, or facial or extremity weakness or numbness.  Pt denies polydipsia, polyuria, or low sugar episode.   Pt denies new neurological symptoms such as new headache, or facial or extremity weakness or numbness.   Pt states overall good compliance with meds, mostly trying to follow appropriate diet, with wt overall stable,  but little exercise however. Denies worsening depressive symptoms, suicidal ideation, or panic; has ongoing anxiety, not increased recently.  No other new history Past Medical History:  Diagnosis Date  . Allergic rhinitis, cause unspecified 01/29/2013  . Anemia 1948?  Marland Kitchen BACK PAIN, CHRONIC 02/09/2010  . Cancer ([place])    SKIN  . Cataract   . COLONIC POLYPS, HX OF 02/09/2010  . COMMON MIGRAINE 02/09/2010   neuritis in R side of neck & head  . DEGENERATIVE JOINT DISEASE 02/09/2010   hands , lumbar stenosis   . DEPRESSION 02/09/2010   pt. takes Cymbalta for pain issues   . GERD 02/09/2010  . HYPERLIPIDEMIA 02/09/2010  . HYPERTENSION 02/09/2010  . Lumbar disc disease   . Neuromuscular disorder ([place])    neuritis in head & neck  . OCCIPITAL NEURALGIA 06/09/2010  . OSTEOPENIA 02/09/2010  . Pneumonia    "as an infant" (05/15/2012)  . Varicose vein of leg    BLE   Past Surgical History:  Procedure Laterality Date  . BREAST BIOPSY  1988   left; Neg  . CHOLECYSTECTOMY  ? 2009  . chymopapain injection  1986   Lumbar  . COLONOSCOPY    . DILATION AND CURETTAGE OF UTERUS  1960's  . HERNIA REPAIR  05/15/2012   ventral incisional   . LUMBAR FUSION  05/22/2013   L2  L3 decompression /fusion      Dr Lynann Bologna  . LUMBAR LAMINECTOMY  1986  . OVARIAN CYST REMOVAL  2006    . Stimulator placed   07/2014   placed 2 years ago for pain  . TRANSFORAMINAL LUMBAR INTERBODY FUSION (TLIF) WITH PEDICLE SCREW FIXATION 1 LEVEL Left 05/22/2013   Procedure: TRANSFORAMINAL LUMBAR INTERBODY FUSION (TLIF) WITH PEDICLE SCREW FIXATION 1 LEVEL;  Surgeon: Sinclair Ship, MD;  Location: [facility];  Service: Orthopedics;  Laterality: Left;  Left sided lumbar 2-3 Transforaminal lumbar interbody fusion with instrumentation, allograft  . TUBAL LIGATION  1970's  . VARICOSE VEIN SURGERY  ~ 2008   bilaterally  . VENTRAL HERNIA REPAIR  05/15/2012   Procedure: LAPAROSCOPIC VENTRAL HERNIA;  Surgeon: Gayland Curry, MD,FACS;  Location: [facility];  Service: General;  Laterality: N/A;  laparoscopic ventral incisional hernia repair with mesh    reports that she quit smoking about 45 years ago. Her smoking use included Cigarettes. She has a 10.00 pack-year smoking history. She has never used smokeless tobacco. She reports that she does not drink alcohol or use drugs. family history includes Breast cancer in her maternal grandmother; COPD in her mother; Cancer in her maternal grandmother, mother, and son; Colon cancer in her maternal aunt; Diabetes in her father; Lung cancer in her mother; Varicose Veins in her mother. Allergies  Allergen Reactions  . Cefuroxime     Might cause hives  . Cephalosporins  Might cause hives   . Latex Nausea Only  . Celexa [Citalopram Hydrobromide] Nausea Only  . Dexlansoprazole Anxiety    Sleeplessness heart racing headaches chiulls  . Lexapro [Escitalopram Oxalate] Nausea Only  . Penicillins Hives  . Sulfonamide Derivatives Hives  . Vancomycin Rash   Current Outpatient Prescriptions on File Prior to Visit  Medication Sig Dispense Refill  . acetaminophen (TYLENOL) 500 MG tablet Take 1,000 mg by mouth every 6 (six) hours as needed for pain.    . calcium gluconate 500 MG tablet Take 500 mg by mouth 2 (two) times daily.      . hydrochlorothiazide (MICROZIDE)  12.5 MG capsule TAKE 1 CAPSULE EVERY DAY 90 capsule 3  . meclizine (ANTIVERT) 25 MG tablet Take 25 mg by mouth 3 (three) times daily as needed for dizziness.    Marland Kitchen omeprazole (PRILOSEC) 20 MG capsule Take 1 capsule (20 mg total) by mouth daily. 90 capsule 3  . simvastatin (ZOCOR) 20 MG tablet TAKE 1 TABLET EVERY DAY  AT  6PM 90 tablet 0   No current facility-administered medications on file prior to visit.    Review of Systems   Constitutional: Negative for other unusual diaphoresis or sweats HENT: Negative for ear discharge or swelling Eyes: Negative for other worsening visual disturbances Respiratory: Negative for stridor or other swelling  Gastrointestinal: Negative for worsening distension or other blood Genitourinary: Negative for retention or other urinary change Musculoskeletal: Negative for other MSK pain or swelling Skin: Negative for color change or other new lesions Neurological: Negative for worsening tremors and other numbness  Psychiatric/Behavioral: Negative for worsening agitation or other fatigue All other system neg per pt    Objective:   Physical Exam BP (!) 143/82   Pulse 74   Resp 20   Ht 5\' 5"  (1.651 m)   Wt 144 lb (65.3 kg)   SpO2 99%   BMI 23.96 kg/m  VS noted,  Constitutional: Pt appears in NAD HENT: Head: NCAT.  Right Ear: External ear normal.  Left Ear: External ear normal.  Eyes: . Pupils are equal, round, and reactive to light. Conjunctivae and EOM are normal Nose: without d/c or deformity Neck: Neck supple. Gross normal ROM Cardiovascular: Normal rate and regular rhythm.   Pulmonary/Chest: Effort normal and breath sounds without rales or wheezing.  Neurological: Pt is alert. At baseline orientation, motor grossly intact Skin: Skin is warm. No rashes, other new lesions, no LE edema Psychiatric: Pt behavior is normal without agitation  No other exam findings    Assessment & Plan:

## 2016-10-30 NOTE — Assessment & Plan Note (Signed)
stable overall by history and exam, recent data reviewed with pt, and pt to continue medical treatment as before,  to f/u any worsening symptoms or concerns BP Readings from Last 3 Encounters:  10/27/16 (!) 143/82  02/09/15 (!) 158/88  01/17/15 146/85

## 2016-10-31 DIAGNOSIS — H43813 Vitreous degeneration, bilateral: Secondary | ICD-10-CM | POA: Diagnosis not present

## 2016-10-31 DIAGNOSIS — H2513 Age-related nuclear cataract, bilateral: Secondary | ICD-10-CM | POA: Diagnosis not present

## 2016-10-31 DIAGNOSIS — H35363 Drusen (degenerative) of macula, bilateral: Secondary | ICD-10-CM | POA: Diagnosis not present

## 2016-11-03 ENCOUNTER — Other Ambulatory Visit: Payer: Self-pay | Admitting: Internal Medicine

## 2016-11-03 DIAGNOSIS — Z86010 Personal history of colonic polyps: Secondary | ICD-10-CM | POA: Diagnosis not present

## 2016-11-03 DIAGNOSIS — R109 Unspecified abdominal pain: Secondary | ICD-10-CM | POA: Diagnosis not present

## 2016-11-03 DIAGNOSIS — R1013 Epigastric pain: Secondary | ICD-10-CM | POA: Diagnosis not present

## 2016-11-03 DIAGNOSIS — K219 Gastro-esophageal reflux disease without esophagitis: Secondary | ICD-10-CM | POA: Diagnosis not present

## 2016-11-03 MED ORDER — HYDROCHLOROTHIAZIDE 12.5 MG PO CAPS: 12.5000 mg | ORAL_CAPSULE | Freq: Every day | ORAL | 0 refills | Status: DC

## 2016-11-03 NOTE — Telephone Encounter (Signed)
Spoke to pt husband and let him know that scripts were resent to pharmacy.

## 2016-11-03 NOTE — Telephone Encounter (Signed)
Patient states she spoke to [facility] on the 4/5 about filling hydrochlorothiazide and simvastatin.  States [employer] does not have either.  States that she is out of simvastatin and only has a week worth of hydrochlorothiazide.  Please send and follow up with patient.  Patients phone number is [phone].  If patient is not available patient states you can tell husband that scripts got sent.

## 2016-11-08 DIAGNOSIS — M85852 Other specified disorders of bone density and structure, left thigh: Secondary | ICD-10-CM | POA: Diagnosis not present

## 2016-11-08 DIAGNOSIS — M810 Age-related osteoporosis without current pathological fracture: Secondary | ICD-10-CM | POA: Diagnosis not present

## 2016-11-10 DIAGNOSIS — K219 Gastro-esophageal reflux disease without esophagitis: Secondary | ICD-10-CM | POA: Diagnosis not present

## 2016-11-10 DIAGNOSIS — R1010 Upper abdominal pain, unspecified: Secondary | ICD-10-CM | POA: Diagnosis not present

## 2016-11-10 DIAGNOSIS — R109 Unspecified abdominal pain: Secondary | ICD-10-CM | POA: Diagnosis not present

## 2016-11-10 DIAGNOSIS — Z86010 Personal history of colonic polyps: Secondary | ICD-10-CM | POA: Diagnosis not present

## 2016-11-10 DIAGNOSIS — Z981 Arthrodesis status: Secondary | ICD-10-CM | POA: Diagnosis not present

## 2016-11-10 DIAGNOSIS — R1013 Epigastric pain: Secondary | ICD-10-CM | POA: Diagnosis not present

## 2016-11-22 DIAGNOSIS — M533 Sacrococcygeal disorders, not elsewhere classified: Secondary | ICD-10-CM | POA: Diagnosis not present

## 2016-11-22 DIAGNOSIS — G894 Chronic pain syndrome: Secondary | ICD-10-CM | POA: Diagnosis not present

## 2016-11-22 DIAGNOSIS — M5416 Radiculopathy, lumbar region: Secondary | ICD-10-CM | POA: Diagnosis not present

## 2016-11-23 DIAGNOSIS — M533 Sacrococcygeal disorders, not elsewhere classified: Secondary | ICD-10-CM | POA: Diagnosis not present

## 2016-11-29 DIAGNOSIS — M810 Age-related osteoporosis without current pathological fracture: Secondary | ICD-10-CM | POA: Diagnosis not present

## 2016-12-07 DIAGNOSIS — M533 Sacrococcygeal disorders, not elsewhere classified: Secondary | ICD-10-CM | POA: Diagnosis not present

## 2016-12-23 DIAGNOSIS — M1711 Unilateral primary osteoarthritis, right knee: Secondary | ICD-10-CM | POA: Diagnosis not present

## 2016-12-23 DIAGNOSIS — M961 Postlaminectomy syndrome, not elsewhere classified: Secondary | ICD-10-CM | POA: Diagnosis not present

## 2016-12-23 DIAGNOSIS — M47817 Spondylosis without myelopathy or radiculopathy, lumbosacral region: Secondary | ICD-10-CM | POA: Diagnosis not present

## 2016-12-23 DIAGNOSIS — M533 Sacrococcygeal disorders, not elsewhere classified: Secondary | ICD-10-CM | POA: Diagnosis not present

## 2016-12-23 DIAGNOSIS — G894 Chronic pain syndrome: Secondary | ICD-10-CM | POA: Diagnosis not present

## 2016-12-23 DIAGNOSIS — M461 Sacroiliitis, not elsewhere classified: Secondary | ICD-10-CM | POA: Diagnosis not present

## 2016-12-23 DIAGNOSIS — M5416 Radiculopathy, lumbar region: Secondary | ICD-10-CM | POA: Diagnosis not present

## 2017-01-02 DIAGNOSIS — H2511 Age-related nuclear cataract, right eye: Secondary | ICD-10-CM | POA: Diagnosis not present

## 2017-01-09 DIAGNOSIS — H43813 Vitreous degeneration, bilateral: Secondary | ICD-10-CM | POA: Diagnosis not present

## 2017-01-09 DIAGNOSIS — Z882 Allergy status to sulfonamides status: Secondary | ICD-10-CM | POA: Diagnosis not present

## 2017-01-09 DIAGNOSIS — Z87891 Personal history of nicotine dependence: Secondary | ICD-10-CM | POA: Diagnosis not present

## 2017-01-09 DIAGNOSIS — Z880 Allergy status to penicillin: Secondary | ICD-10-CM | POA: Diagnosis not present

## 2017-01-09 DIAGNOSIS — H2513 Age-related nuclear cataract, bilateral: Secondary | ICD-10-CM | POA: Diagnosis not present

## 2017-01-09 DIAGNOSIS — E785 Hyperlipidemia, unspecified: Secondary | ICD-10-CM | POA: Diagnosis not present

## 2017-01-09 DIAGNOSIS — Z91040 Latex allergy status: Secondary | ICD-10-CM | POA: Diagnosis not present

## 2017-01-09 DIAGNOSIS — M1990 Unspecified osteoarthritis, unspecified site: Secondary | ICD-10-CM | POA: Diagnosis not present

## 2017-01-09 DIAGNOSIS — K219 Gastro-esophageal reflux disease without esophagitis: Secondary | ICD-10-CM | POA: Diagnosis not present

## 2017-01-09 DIAGNOSIS — Z881 Allergy status to other antibiotic agents status: Secondary | ICD-10-CM | POA: Diagnosis not present

## 2017-01-09 DIAGNOSIS — H35363 Drusen (degenerative) of macula, bilateral: Secondary | ICD-10-CM | POA: Diagnosis not present

## 2017-01-09 DIAGNOSIS — Z888 Allergy status to other drugs, medicaments and biological substances status: Secondary | ICD-10-CM | POA: Diagnosis not present

## 2017-01-09 DIAGNOSIS — H2511 Age-related nuclear cataract, right eye: Secondary | ICD-10-CM | POA: Diagnosis not present

## 2017-01-09 DIAGNOSIS — I10 Essential (primary) hypertension: Secondary | ICD-10-CM | POA: Diagnosis not present

## 2017-01-11 DIAGNOSIS — H2512 Age-related nuclear cataract, left eye: Secondary | ICD-10-CM | POA: Diagnosis not present

## 2017-01-13 ENCOUNTER — Other Ambulatory Visit: Payer: Self-pay | Admitting: *Deleted

## 2017-01-13 MED ORDER — HYDROCHLOROTHIAZIDE 12.5 MG PO CAPS: 12.5000 mg | ORAL_CAPSULE | Freq: Every day | ORAL | 1 refills | Status: DC

## 2017-01-13 NOTE — Telephone Encounter (Signed)
Rec'd call pt states she is needing new rx on her HCTZ sent to [facility]. Verified chart pt is up-to-date sent to mail order...Johny Chess

## 2017-01-18 ENCOUNTER — Telehealth: Payer: Self-pay | Admitting: Internal Medicine

## 2017-01-18 NOTE — Telephone Encounter (Signed)
Spoke with pt, she would like to know how to get herself off of it. She has been taking in nightly to help her go to sleep but she would prefer to take it PRN. She think she is taking too much of it and it is not working. Please advise.

## 2017-01-18 NOTE — Telephone Encounter (Signed)
Patient states she has been taking carisoprodol daily.  States that she had a hard time getting to sleep last night.  Patient believes its b/c she is taking this medication daily.  Patient is requesting call back in regard.

## 2017-01-18 NOTE — Telephone Encounter (Signed)
Pt has been informed and expressed understanding.  

## 2017-01-18 NOTE — Telephone Encounter (Signed)
Ok to stop this and monitor for any worsening sleep or muscle pain

## 2017-01-24 DIAGNOSIS — H2512 Age-related nuclear cataract, left eye: Secondary | ICD-10-CM | POA: Insufficient documentation

## 2017-01-30 DIAGNOSIS — H35363 Drusen (degenerative) of macula, bilateral: Secondary | ICD-10-CM | POA: Diagnosis not present

## 2017-01-30 DIAGNOSIS — Z91040 Latex allergy status: Secondary | ICD-10-CM | POA: Diagnosis not present

## 2017-01-30 DIAGNOSIS — Z87891 Personal history of nicotine dependence: Secondary | ICD-10-CM | POA: Diagnosis not present

## 2017-01-30 DIAGNOSIS — H2512 Age-related nuclear cataract, left eye: Secondary | ICD-10-CM | POA: Diagnosis not present

## 2017-01-30 DIAGNOSIS — I10 Essential (primary) hypertension: Secondary | ICD-10-CM | POA: Diagnosis not present

## 2017-01-30 DIAGNOSIS — E785 Hyperlipidemia, unspecified: Secondary | ICD-10-CM | POA: Diagnosis not present

## 2017-01-30 DIAGNOSIS — Z888 Allergy status to other drugs, medicaments and biological substances status: Secondary | ICD-10-CM | POA: Diagnosis not present

## 2017-01-30 DIAGNOSIS — H43813 Vitreous degeneration, bilateral: Secondary | ICD-10-CM | POA: Diagnosis not present

## 2017-01-30 DIAGNOSIS — Z882 Allergy status to sulfonamides status: Secondary | ICD-10-CM | POA: Diagnosis not present

## 2017-01-30 DIAGNOSIS — K219 Gastro-esophageal reflux disease without esophagitis: Secondary | ICD-10-CM | POA: Diagnosis not present

## 2017-01-30 DIAGNOSIS — Z880 Allergy status to penicillin: Secondary | ICD-10-CM | POA: Diagnosis not present

## 2017-01-30 DIAGNOSIS — Z881 Allergy status to other antibiotic agents status: Secondary | ICD-10-CM | POA: Diagnosis not present

## 2017-02-07 DIAGNOSIS — H43811 Vitreous degeneration, right eye: Secondary | ICD-10-CM | POA: Diagnosis not present

## 2017-02-07 DIAGNOSIS — H52222 Regular astigmatism, left eye: Secondary | ICD-10-CM | POA: Diagnosis not present

## 2017-02-07 DIAGNOSIS — Z961 Presence of intraocular lens: Secondary | ICD-10-CM | POA: Diagnosis not present

## 2017-02-07 DIAGNOSIS — H5212 Myopia, left eye: Secondary | ICD-10-CM | POA: Diagnosis not present

## 2017-02-07 DIAGNOSIS — H2512 Age-related nuclear cataract, left eye: Secondary | ICD-10-CM | POA: Diagnosis not present

## 2017-02-13 DIAGNOSIS — R1011 Right upper quadrant pain: Secondary | ICD-10-CM | POA: Diagnosis not present

## 2017-02-13 DIAGNOSIS — R0789 Other chest pain: Secondary | ICD-10-CM | POA: Diagnosis not present

## 2017-02-13 DIAGNOSIS — Z86010 Personal history of colonic polyps: Secondary | ICD-10-CM | POA: Diagnosis not present

## 2017-02-13 DIAGNOSIS — K219 Gastro-esophageal reflux disease without esophagitis: Secondary | ICD-10-CM | POA: Diagnosis not present

## 2017-02-14 DIAGNOSIS — M810 Age-related osteoporosis without current pathological fracture: Secondary | ICD-10-CM | POA: Diagnosis not present

## 2017-02-14 DIAGNOSIS — K2990 Gastroduodenitis, unspecified, without bleeding: Secondary | ICD-10-CM | POA: Insufficient documentation

## 2017-02-14 DIAGNOSIS — Z79899 Other long term (current) drug therapy: Secondary | ICD-10-CM | POA: Diagnosis not present

## 2017-02-14 DIAGNOSIS — Z1321 Encounter for screening for nutritional disorder: Secondary | ICD-10-CM | POA: Diagnosis not present

## 2017-02-14 DIAGNOSIS — Z87891 Personal history of nicotine dependence: Secondary | ICD-10-CM | POA: Diagnosis not present

## 2017-02-14 DIAGNOSIS — M659 Synovitis and tenosynovitis, unspecified: Secondary | ICD-10-CM | POA: Insufficient documentation

## 2017-02-27 ENCOUNTER — Telehealth: Payer: Self-pay | Admitting: Internal Medicine

## 2017-02-27 NOTE — Telephone Encounter (Signed)
Patient requesting script for shingrix to be sent to [facility] on [address].

## 2017-02-28 MED ORDER — ZOSTER VAC RECOMB ADJUVANTED 50 MCG/0.5ML IM SUSR: 0.5000 mL | Freq: Once | INTRAMUSCULAR | 1 refills | Status: AC

## 2017-02-28 NOTE — Telephone Encounter (Signed)
Done

## 2017-03-02 DIAGNOSIS — H43813 Vitreous degeneration, bilateral: Secondary | ICD-10-CM | POA: Diagnosis not present

## 2017-03-02 DIAGNOSIS — H5211 Myopia, right eye: Secondary | ICD-10-CM | POA: Diagnosis not present

## 2017-03-02 DIAGNOSIS — Z961 Presence of intraocular lens: Secondary | ICD-10-CM | POA: Diagnosis not present

## 2017-03-02 DIAGNOSIS — M810 Age-related osteoporosis without current pathological fracture: Secondary | ICD-10-CM | POA: Diagnosis not present

## 2017-03-02 DIAGNOSIS — H2512 Age-related nuclear cataract, left eye: Secondary | ICD-10-CM | POA: Diagnosis not present

## 2017-03-02 DIAGNOSIS — H52223 Regular astigmatism, bilateral: Secondary | ICD-10-CM | POA: Diagnosis not present

## 2017-03-02 DIAGNOSIS — H524 Presbyopia: Secondary | ICD-10-CM | POA: Diagnosis not present

## 2017-03-07 DIAGNOSIS — M19072 Primary osteoarthritis, left ankle and foot: Secondary | ICD-10-CM | POA: Diagnosis not present

## 2017-03-07 DIAGNOSIS — M2011 Hallux valgus (acquired), right foot: Secondary | ICD-10-CM | POA: Diagnosis not present

## 2017-03-07 DIAGNOSIS — M79671 Pain in right foot: Secondary | ICD-10-CM | POA: Diagnosis not present

## 2017-03-07 DIAGNOSIS — G5762 Lesion of plantar nerve, left lower limb: Secondary | ICD-10-CM | POA: Diagnosis not present

## 2017-03-07 DIAGNOSIS — G5792 Unspecified mononeuropathy of left lower limb: Secondary | ICD-10-CM | POA: Diagnosis not present

## 2017-03-07 DIAGNOSIS — M2012 Hallux valgus (acquired), left foot: Secondary | ICD-10-CM | POA: Diagnosis not present

## 2017-03-07 DIAGNOSIS — M19071 Primary osteoarthritis, right ankle and foot: Secondary | ICD-10-CM | POA: Diagnosis not present

## 2017-03-07 DIAGNOSIS — M85871 Other specified disorders of bone density and structure, right ankle and foot: Secondary | ICD-10-CM | POA: Diagnosis not present

## 2017-03-07 DIAGNOSIS — M79672 Pain in left foot: Secondary | ICD-10-CM | POA: Diagnosis not present

## 2017-03-21 ENCOUNTER — Other Ambulatory Visit: Payer: Self-pay | Admitting: Internal Medicine

## 2017-04-13 ENCOUNTER — Telehealth: Payer: Self-pay | Admitting: General Practice

## 2017-04-13 ENCOUNTER — Ambulatory Visit (INDEPENDENT_AMBULATORY_CARE_PROVIDER_SITE_OTHER): Payer: Medicare Other | Admitting: General Practice

## 2017-04-13 DIAGNOSIS — M79673 Pain in unspecified foot: Secondary | ICD-10-CM

## 2017-04-13 DIAGNOSIS — Z23 Encounter for immunization: Secondary | ICD-10-CM | POA: Diagnosis not present

## 2017-04-13 NOTE — Telephone Encounter (Signed)
Patient is requesting a referral to Dr. Posey Pronto @ [facility]).

## 2017-04-14 NOTE — Telephone Encounter (Signed)
Ok, this is done 

## 2017-04-14 NOTE — Addendum Note (Signed)
Addended by: Biagio Borg on: 04/14/2017 01:04 PM   Modules accepted: Orders

## 2017-04-28 DIAGNOSIS — M79672 Pain in left foot: Secondary | ICD-10-CM | POA: Diagnosis not present

## 2017-04-28 DIAGNOSIS — D3613 Benign neoplasm of peripheral nerves and autonomic nervous system of lower limb, including hip: Secondary | ICD-10-CM | POA: Diagnosis not present

## 2017-05-12 DIAGNOSIS — D3613 Benign neoplasm of peripheral nerves and autonomic nervous system of lower limb, including hip: Secondary | ICD-10-CM | POA: Diagnosis not present

## 2017-05-12 DIAGNOSIS — M79672 Pain in left foot: Secondary | ICD-10-CM | POA: Diagnosis not present

## 2017-05-22 DIAGNOSIS — Z1231 Encounter for screening mammogram for malignant neoplasm of breast: Secondary | ICD-10-CM | POA: Diagnosis not present

## 2017-05-24 DIAGNOSIS — Z08 Encounter for follow-up examination after completed treatment for malignant neoplasm: Secondary | ICD-10-CM | POA: Diagnosis not present

## 2017-05-24 DIAGNOSIS — D2339 Other benign neoplasm of skin of other parts of face: Secondary | ICD-10-CM | POA: Diagnosis not present

## 2017-05-24 DIAGNOSIS — L821 Other seborrheic keratosis: Secondary | ICD-10-CM | POA: Diagnosis not present

## 2017-05-24 DIAGNOSIS — Z85828 Personal history of other malignant neoplasm of skin: Secondary | ICD-10-CM | POA: Diagnosis not present

## 2017-05-26 DIAGNOSIS — D3613 Benign neoplasm of peripheral nerves and autonomic nervous system of lower limb, including hip: Secondary | ICD-10-CM | POA: Diagnosis not present

## 2017-05-26 DIAGNOSIS — M79672 Pain in left foot: Secondary | ICD-10-CM | POA: Diagnosis not present

## 2017-05-29 ENCOUNTER — Telehealth: Payer: Self-pay | Admitting: Internal Medicine

## 2017-05-29 NOTE — Telephone Encounter (Signed)
Patient called stating that she got her first shingrix vac at her pharmacy and they pharmacy is currently out.  [facility]?

## 2017-05-30 ENCOUNTER — Telehealth: Payer: Self-pay

## 2017-05-30 DIAGNOSIS — Z299 Encounter for prophylactic measures, unspecified: Secondary | ICD-10-CM

## 2017-05-30 MED ORDER — ZOSTER VAC RECOMB ADJUVANTED 50 MCG/0.5ML IM SUSR: 0.5000 mL | Freq: Once | INTRAMUSCULAR | 1 refills | Status: AC

## 2017-05-30 MED FILL — SHINGRIX VIAL KIT: 50 | 1 days supply | Qty: 1 | Fill #0

## 2017-05-30 NOTE — Telephone Encounter (Signed)
error 

## 2017-05-30 NOTE — Telephone Encounter (Signed)
rx sent

## 2017-06-02 DIAGNOSIS — M25475 Effusion, left foot: Secondary | ICD-10-CM | POA: Diagnosis not present

## 2017-06-02 DIAGNOSIS — M19072 Primary osteoarthritis, left ankle and foot: Secondary | ICD-10-CM | POA: Diagnosis not present

## 2017-06-02 DIAGNOSIS — D3613 Benign neoplasm of peripheral nerves and autonomic nervous system of lower limb, including hip: Secondary | ICD-10-CM | POA: Diagnosis not present

## 2017-06-02 DIAGNOSIS — M25775 Osteophyte, left foot: Secondary | ICD-10-CM | POA: Diagnosis not present

## 2017-06-05 DIAGNOSIS — M7742 Metatarsalgia, left foot: Secondary | ICD-10-CM | POA: Diagnosis not present

## 2017-06-05 DIAGNOSIS — M79672 Pain in left foot: Secondary | ICD-10-CM | POA: Diagnosis not present

## 2017-06-05 DIAGNOSIS — D3613 Benign neoplasm of peripheral nerves and autonomic nervous system of lower limb, including hip: Secondary | ICD-10-CM | POA: Diagnosis not present

## 2017-06-12 DIAGNOSIS — Z86010 Personal history of colonic polyps: Secondary | ICD-10-CM | POA: Diagnosis not present

## 2017-06-12 DIAGNOSIS — K219 Gastro-esophageal reflux disease without esophagitis: Secondary | ICD-10-CM | POA: Diagnosis not present

## 2017-06-12 DIAGNOSIS — R0789 Other chest pain: Secondary | ICD-10-CM | POA: Diagnosis not present

## 2017-06-12 DIAGNOSIS — R109 Unspecified abdominal pain: Secondary | ICD-10-CM | POA: Diagnosis not present

## 2017-06-21 DIAGNOSIS — M810 Age-related osteoporosis without current pathological fracture: Secondary | ICD-10-CM | POA: Diagnosis not present

## 2017-06-21 DIAGNOSIS — N952 Postmenopausal atrophic vaginitis: Secondary | ICD-10-CM | POA: Diagnosis not present

## 2017-06-21 DIAGNOSIS — G4700 Insomnia, unspecified: Secondary | ICD-10-CM | POA: Diagnosis not present

## 2017-06-26 DIAGNOSIS — G5782 Other specified mononeuropathies of left lower limb: Secondary | ICD-10-CM | POA: Diagnosis not present

## 2017-06-26 DIAGNOSIS — M19072 Primary osteoarthritis, left ankle and foot: Secondary | ICD-10-CM | POA: Diagnosis not present

## 2017-06-26 DIAGNOSIS — M79672 Pain in left foot: Secondary | ICD-10-CM | POA: Diagnosis not present

## 2017-06-26 DIAGNOSIS — G5762 Lesion of plantar nerve, left lower limb: Secondary | ICD-10-CM | POA: Diagnosis not present

## 2017-07-11 DIAGNOSIS — G5761 Lesion of plantar nerve, right lower limb: Secondary | ICD-10-CM | POA: Diagnosis not present

## 2017-07-11 DIAGNOSIS — G5790 Unspecified mononeuropathy of unspecified lower limb: Secondary | ICD-10-CM | POA: Diagnosis not present

## 2017-07-11 DIAGNOSIS — M79671 Pain in right foot: Secondary | ICD-10-CM | POA: Diagnosis not present

## 2017-07-29 ENCOUNTER — Other Ambulatory Visit: Payer: Self-pay | Admitting: Internal Medicine

## 2017-09-04 DIAGNOSIS — G5762 Lesion of plantar nerve, left lower limb: Secondary | ICD-10-CM | POA: Diagnosis not present

## 2017-09-04 DIAGNOSIS — M79672 Pain in left foot: Secondary | ICD-10-CM | POA: Diagnosis not present

## 2017-09-04 DIAGNOSIS — M792 Neuralgia and neuritis, unspecified: Secondary | ICD-10-CM | POA: Diagnosis not present

## 2017-09-04 DIAGNOSIS — M7742 Metatarsalgia, left foot: Secondary | ICD-10-CM | POA: Diagnosis not present

## 2017-09-05 DIAGNOSIS — M7742 Metatarsalgia, left foot: Secondary | ICD-10-CM | POA: Diagnosis not present

## 2017-09-05 DIAGNOSIS — G5762 Lesion of plantar nerve, left lower limb: Secondary | ICD-10-CM | POA: Diagnosis not present

## 2017-09-05 DIAGNOSIS — M792 Neuralgia and neuritis, unspecified: Secondary | ICD-10-CM | POA: Diagnosis not present

## 2017-09-05 DIAGNOSIS — M79672 Pain in left foot: Secondary | ICD-10-CM | POA: Diagnosis not present

## 2017-09-19 DIAGNOSIS — M47817 Spondylosis without myelopathy or radiculopathy, lumbosacral region: Secondary | ICD-10-CM | POA: Diagnosis not present

## 2017-09-19 DIAGNOSIS — M533 Sacrococcygeal disorders, not elsewhere classified: Secondary | ICD-10-CM | POA: Diagnosis not present

## 2017-09-19 DIAGNOSIS — G894 Chronic pain syndrome: Secondary | ICD-10-CM | POA: Diagnosis not present

## 2017-09-19 DIAGNOSIS — M5416 Radiculopathy, lumbar region: Secondary | ICD-10-CM | POA: Diagnosis not present

## 2017-09-19 DIAGNOSIS — M1711 Unilateral primary osteoarthritis, right knee: Secondary | ICD-10-CM | POA: Diagnosis not present

## 2017-09-19 DIAGNOSIS — M461 Sacroiliitis, not elsewhere classified: Secondary | ICD-10-CM | POA: Diagnosis not present

## 2017-09-19 DIAGNOSIS — M961 Postlaminectomy syndrome, not elsewhere classified: Secondary | ICD-10-CM | POA: Diagnosis not present

## 2017-09-21 DIAGNOSIS — M533 Sacrococcygeal disorders, not elsewhere classified: Secondary | ICD-10-CM | POA: Diagnosis not present

## 2017-10-06 DIAGNOSIS — G894 Chronic pain syndrome: Secondary | ICD-10-CM | POA: Diagnosis not present

## 2017-10-06 DIAGNOSIS — M461 Sacroiliitis, not elsewhere classified: Secondary | ICD-10-CM | POA: Diagnosis not present

## 2017-10-06 DIAGNOSIS — M1711 Unilateral primary osteoarthritis, right knee: Secondary | ICD-10-CM | POA: Diagnosis not present

## 2017-10-06 DIAGNOSIS — M47817 Spondylosis without myelopathy or radiculopathy, lumbosacral region: Secondary | ICD-10-CM | POA: Diagnosis not present

## 2017-10-06 DIAGNOSIS — M5416 Radiculopathy, lumbar region: Secondary | ICD-10-CM | POA: Diagnosis not present

## 2017-10-06 DIAGNOSIS — M961 Postlaminectomy syndrome, not elsewhere classified: Secondary | ICD-10-CM | POA: Diagnosis not present

## 2017-10-06 DIAGNOSIS — M533 Sacrococcygeal disorders, not elsewhere classified: Secondary | ICD-10-CM | POA: Diagnosis not present

## 2017-10-10 DIAGNOSIS — R0789 Other chest pain: Secondary | ICD-10-CM | POA: Diagnosis not present

## 2017-10-10 DIAGNOSIS — M7918 Myalgia, other site: Secondary | ICD-10-CM | POA: Diagnosis not present

## 2017-10-17 DIAGNOSIS — R262 Difficulty in walking, not elsewhere classified: Secondary | ICD-10-CM | POA: Diagnosis not present

## 2017-10-17 DIAGNOSIS — G5762 Lesion of plantar nerve, left lower limb: Secondary | ICD-10-CM | POA: Diagnosis not present

## 2017-10-17 DIAGNOSIS — M79672 Pain in left foot: Secondary | ICD-10-CM | POA: Diagnosis not present

## 2017-10-17 DIAGNOSIS — M792 Neuralgia and neuritis, unspecified: Secondary | ICD-10-CM | POA: Diagnosis not present

## 2017-10-21 ENCOUNTER — Other Ambulatory Visit: Payer: Self-pay | Admitting: Internal Medicine

## 2017-10-30 ENCOUNTER — Other Ambulatory Visit (INDEPENDENT_AMBULATORY_CARE_PROVIDER_SITE_OTHER): Payer: Medicare Other

## 2017-10-30 ENCOUNTER — Ambulatory Visit (INDEPENDENT_AMBULATORY_CARE_PROVIDER_SITE_OTHER): Payer: Medicare Other | Admitting: Internal Medicine

## 2017-10-30 ENCOUNTER — Ambulatory Visit (INDEPENDENT_AMBULATORY_CARE_PROVIDER_SITE_OTHER): Payer: Medicare Other | Admitting: *Deleted

## 2017-10-30 VITALS — BP 132/74 | HR 75 | Temp 98.9°F | Resp 18 | Ht 65.0 in | Wt 144.0 lb

## 2017-10-30 DIAGNOSIS — F329 Major depressive disorder, single episode, unspecified: Secondary | ICD-10-CM

## 2017-10-30 DIAGNOSIS — Z0000 Encounter for general adult medical examination without abnormal findings: Secondary | ICD-10-CM | POA: Diagnosis not present

## 2017-10-30 DIAGNOSIS — F32A Depression, unspecified: Secondary | ICD-10-CM

## 2017-10-30 DIAGNOSIS — E785 Hyperlipidemia, unspecified: Secondary | ICD-10-CM | POA: Diagnosis not present

## 2017-10-30 DIAGNOSIS — I10 Essential (primary) hypertension: Secondary | ICD-10-CM

## 2017-10-30 LAB — LIPID PANEL
Cholesterol: 168 mg/dL (ref 0–200)
HDL: 64.6 mg/dL (ref >39.00)
LDL Cholesterol: 81 mg/dL (ref 0–99)
NonHDL: 103.74
Total CHOL/HDL Ratio: 3
Triglycerides: 115 mg/dL (ref 0.0–149.0)
VLDL: 23 mg/dL (ref 0.0–40.0)

## 2017-10-30 LAB — BASIC METABOLIC PANEL
BUN: 14 mg/dL (ref 6–23)
CO2: 27 mEq/L (ref 19–32)
Calcium: 9.6 mg/dL (ref 8.4–10.5)
Chloride: 97 mEq/L (ref 96–112)
Creatinine, Ser: 0.72 mg/dL (ref 0.40–1.20)
GFR: 83.36 mL/min (ref >60.00)
Glucose, Bld: 101 mg/dL — ABNORMAL HIGH (ref 70–99)
Potassium: 3.9 mEq/L (ref 3.5–5.1)
Sodium: 134 mEq/L — ABNORMAL LOW (ref 135–145)

## 2017-10-30 LAB — URINALYSIS, ROUTINE W REFLEX MICROSCOPIC
Bilirubin Urine: NEGATIVE
Ketones, ur: NEGATIVE
Nitrite: NEGATIVE
Specific Gravity, Urine: 1.015 (ref 1.000–1.030)
Total Protein, Urine: NEGATIVE
Urine Glucose: NEGATIVE
Urobilinogen, UA: 0.2 (ref 0.0–1.0)
pH: 6 (ref 5.0–8.0)

## 2017-10-30 LAB — CBC WITH DIFFERENTIAL/PLATELET
Basophils Absolute: 0 10*3/uL (ref 0.0–0.1)
Basophils Relative: 0.6 % (ref 0.0–3.0)
Eosinophils Absolute: 0 10*3/uL (ref 0.0–0.7)
Eosinophils Relative: 0.4 % (ref 0.0–5.0)
HCT: 44 % (ref 36.0–46.0)
Hemoglobin: 15 g/dL (ref 12.0–15.0)
Lymphocytes Relative: 15.7 % (ref 12.0–46.0)
Lymphs Abs: 1.3 10*3/uL (ref 0.7–4.0)
MCHC: 34.1 g/dL (ref 30.0–36.0)
MCV: 94.6 fl (ref 78.0–100.0)
Monocytes Absolute: 0.6 10*3/uL (ref 0.1–1.0)
Monocytes Relative: 8 % (ref 3.0–12.0)
Neutro Abs: 6 10*3/uL (ref 1.4–7.7)
Neutrophils Relative %: 75.3 % (ref 43.0–77.0)
Platelets: 248 10*3/uL (ref 150.0–400.0)
RBC: 4.65 Mil/uL (ref 3.87–5.11)
RDW: 13.2 % (ref 11.5–15.5)
WBC: 8 10*3/uL (ref 4.0–10.5)

## 2017-10-30 LAB — TSH: TSH: 1.55 u[IU]/mL (ref 0.35–4.50)

## 2017-10-30 LAB — HEPATIC FUNCTION PANEL
ALT: 13 U/L (ref 0–35)
AST: 15 U/L (ref 0–37)
Albumin: 4.4 g/dL (ref 3.5–5.2)
Alkaline Phosphatase: 46 U/L (ref 39–117)
Bilirubin, Direct: 0.1 mg/dL (ref 0.0–0.3)
Total Bilirubin: 0.4 mg/dL (ref 0.2–1.2)
Total Protein: 6.5 g/dL (ref 6.0–8.3)

## 2017-10-30 MED ORDER — CARISOPRODOL 350 MG PO TABS: 350.0000 mg | ORAL_TABLET | Freq: Every day | ORAL | 1 refills | Status: DC

## 2017-10-30 NOTE — Progress Notes (Signed)
Subjective:    Patient ID: Crystal Herrera, female    DOB: 1940-12-06, 77 y.o.   MRN: [id number]  HPI  Here for yearly f/u;  Overall doing ok;  Pt denies Chest pain, worsening SOB, DOE, wheezing, orthopnea, PND, worsening LE edema, palpitations, dizziness or syncope.  Pt denies neurological change such as new headache, facial or extremity weakness.  Pt denies polydipsia, polyuria, or low sugar symptoms. Pt states overall good compliance with treatment and medications, good tolerability, and has been trying to follow appropriate diet.  Pt denies worsening depressive symptoms, suicidal ideation or panic. No fever, night sweats, wt loss, loss of appetite, or other constitutional symptoms.  Pt states good ability with ADL's, has low fall risk, home safety reviewed and adequate, no other significant changes in hearing or vision, and not active with exercise. No specific complaints or interval hx Past Medical History:  Diagnosis Date  . Allergic rhinitis, cause unspecified 01/29/2013  . Anemia 1948?  Marland Kitchen BACK PAIN, CHRONIC 02/09/2010  . Cancer ([place])    SKIN  . Cataract   . COLONIC POLYPS, HX OF 02/09/2010  . COMMON MIGRAINE 02/09/2010   neuritis in R side of neck & head  . DEGENERATIVE JOINT DISEASE 02/09/2010   hands , lumbar stenosis   . DEPRESSION 02/09/2010   pt. takes Cymbalta for pain issues   . GERD 02/09/2010  . HYPERLIPIDEMIA 02/09/2010  . HYPERTENSION 02/09/2010  . Lumbar disc disease   . Neuromuscular disorder ([place])    neuritis in head & neck  . OCCIPITAL NEURALGIA 06/09/2010  . OSTEOPENIA 02/09/2010  . Pneumonia    "as an infant" (05/15/2012)  . Varicose vein of leg    BLE   Past Surgical History:  Procedure Laterality Date  . BREAST BIOPSY  1988   left; Neg  . CHOLECYSTECTOMY  ? 2009  . chymopapain injection  1986   Lumbar  . COLONOSCOPY    . DILATION AND CURETTAGE OF UTERUS  1960's  . HERNIA REPAIR  05/15/2012   ventral incisional   . LUMBAR FUSION  05/22/2013   L2  L3  decompression /fusion      Dr Lynann Bologna  . LUMBAR LAMINECTOMY  1986  . OVARIAN CYST REMOVAL  2006  . Stimulator placed   07/2014   placed 2 years ago for pain  . TRANSFORAMINAL LUMBAR INTERBODY FUSION (TLIF) WITH PEDICLE SCREW FIXATION 1 LEVEL Left 05/22/2013   Procedure: TRANSFORAMINAL LUMBAR INTERBODY FUSION (TLIF) WITH PEDICLE SCREW FIXATION 1 LEVEL;  Surgeon: Sinclair Ship, MD;  Location: [facility];  Service: Orthopedics;  Laterality: Left;  Left sided lumbar 2-3 Transforaminal lumbar interbody fusion with instrumentation, allograft  . TUBAL LIGATION  1970's  . VARICOSE VEIN SURGERY  ~ 2008   bilaterally  . VENTRAL HERNIA REPAIR  05/15/2012   Procedure: LAPAROSCOPIC VENTRAL HERNIA;  Surgeon: Gayland Curry, MD,FACS;  Location: [facility];  Service: General;  Laterality: N/A;  laparoscopic ventral incisional hernia repair with mesh    reports that she quit smoking about 46 years ago. Her smoking use included cigarettes. She has a 10.00 pack-year smoking history. She has never used smokeless tobacco. She reports that she does not drink alcohol or use drugs. family history includes Breast cancer in her maternal grandmother; COPD in her mother; Cancer in her maternal grandmother, mother, and son; Colon cancer in her maternal aunt; Diabetes in her father; Lung cancer in her mother; Varicose Veins in her mother. Allergies  Allergen Reactions  .  Cefuroxime     Might cause hives  . Cephalosporins     Might cause hives   . Latex Nausea Only  . Celexa [Citalopram Hydrobromide] Nausea Only  . Dexlansoprazole Anxiety    Sleeplessness heart racing headaches chiulls  . Lexapro [Escitalopram Oxalate] Nausea Only  . Penicillins Hives  . Sulfonamide Derivatives Hives  . Vancomycin Rash   Current Outpatient Medications on File Prior to Visit  Medication Sig Dispense Refill  . acetaminophen (TYLENOL) 500 MG tablet Take 1,000 mg by mouth every 6 (six) hours as needed for pain.    . calcium gluconate  500 MG tablet Take 500 mg by mouth 2 (two) times daily.      . hydrochlorothiazide (MICROZIDE) 12.5 MG capsule Take 1 capsule by mouth daily 90 capsule 0  . meclizine (ANTIVERT) 25 MG tablet Take 25 mg by mouth 3 (three) times daily as needed for dizziness.    . simvastatin (ZOCOR) 20 MG tablet Take 1 tablet by mouth every day at 6pm 90 tablet 2   No current facility-administered medications on file prior to visit.    Review of Systems Constitutional: Negative for other unusual diaphoresis, sweats, appetite or weight changes HENT: Negative for other worsening hearing loss, ear pain, facial swelling, mouth sores or neck stiffness.   Eyes: Negative for other worsening pain, redness or other visual disturbance.  Respiratory: Negative for other stridor or swelling Cardiovascular: Negative for other palpitations or other chest pain  Gastrointestinal: Negative for worsening diarrhea or loose stools, blood in stool, distention or other pain Genitourinary: Negative for hematuria, flank pain or other change in urine volume.  Musculoskeletal: Negative for myalgias or other joint swelling.  Skin: Negative for other color change, or other wound or worsening drainage.  Neurological: Negative for other syncope or numbness. Hematological: Negative for other adenopathy or swelling Psychiatric/Behavioral: Negative for hallucinations, other worsening agitation, SI, self-injury, or new decreased concentration All other system neg per pt    Objective:   Physical Exam There were no vitals taken for this visit. \VS declined per pt Constitutional: Pt is oriented to person, place, and time. Appears well-developed and well-nourished, in no significant distress and comfortable Head: Normocephalic and atraumatic  Eyes: Conjunctivae and EOM are normal. Pupils are equal, round, and reactive to light Right Ear: External ear normal without discharge Left Ear: External ear normal without discharge Nose: Nose without  discharge or deformity Mouth/Throat: Oropharynx is without other ulcerations and moist  Neck: Normal range of motion. Neck supple. No JVD present. No tracheal deviation present or significant neck LA or mass Cardiovascular: Normal rate, regular rhythm, normal heart sounds and intact distal pulses.   Pulmonary/Chest: WOB normal and breath sounds without rales or wheezing  Abdominal: Soft. Bowel sounds are normal. NT. No HSM  Musculoskeletal: Normal range of motion. Exhibits no edema Lymphadenopathy: Has no other cervical adenopathy.  Neurological: Pt is alert and oriented to person, place, and time. Pt has normal reflexes. No cranial nerve deficit. Motor grossly intact, Gait intact Skin: Skin is warm and dry. No rash noted or new ulcerations Psychiatric:  Has normal mood and affect. Behavior is normal without agitation No other exam findings Lab Results  Component Value Date   WBC 8.0 10/30/2017   HGB 15.0 10/30/2017   HCT 44.0 10/30/2017   PLT 248.0 10/30/2017   GLUCOSE 101 (H) 10/30/2017   CHOL 168 10/30/2017   TRIG 115.0 10/30/2017   HDL 64.60 10/30/2017   LDLDIRECT 122.2 02/09/2010  LDLCALC 81 10/30/2017   ALT 13 10/30/2017   AST 15 10/30/2017   NA 134 (L) 10/30/2017   K 3.9 10/30/2017   CL 97 10/30/2017   CREATININE 0.72 10/30/2017   BUN 14 10/30/2017   CO2 27 10/30/2017   TSH 1.55 10/30/2017   INR 0.97 05/22/2013      Assessment & Plan:

## 2017-10-30 NOTE — Patient Instructions (Signed)
Continue doing brain stimulating activities (puzzles, reading, adult coloring books, staying active) to keep memory sharp.   Continue to eat heart healthy diet (full of fruits, vegetables, whole grains, lean protein, water--limit salt, fat, and sugar intake) and increase physical activity as tolerated.   Crystal Herrera , Thank you for taking time to come for your Medicare Wellness Visit. I appreciate your ongoing commitment to your health goals. Please review the following plan we discussed and let me know if I can assist you in the future.   These are the goals we discussed: Goals    . Patient Stated     Maintain current health status. Eat healthy and increase physical activity as tolerated by walking and swimming. Enjoy life, family, and [place] part time.        This is a list of the screening recommended for you and due dates:  Health Maintenance  Topic Date Due  . Flu Shot  02/22/2018  . Tetanus Vaccine  10/05/2023  . DEXA scan (bone density measurement)  Completed  . Pneumonia vaccines  Completed

## 2017-10-30 NOTE — Assessment & Plan Note (Signed)
Lab Results  Component Value Date   LDLCALC 81 10/30/2017  stable overall by history and exam, recent data reviewed with pt, and pt to continue medical treatment as before,  to f/u any worsening symptoms or concerns

## 2017-10-30 NOTE — Progress Notes (Addendum)
Subjective:   Crystal Herrera is a 77 y.o. female who presents for an Initial Medicare Annual Wellness Visit.  Review of Systems    No ROS.  Medicare Wellness Visit. Additional risk factors are reflected in the social history.  Cardiac Risk Factors include: advanced age (>7men, >51 women);dyslipidemia;hypertension Sleep patterns: feels rested on waking, gets up 2 times nightly to void and sleeps 6 hours nightly. Patient reports insomnia issues, discussed recommended sleep tips.   Home Safety/Smoke Alarms: Feels safe in home. Smoke alarms in place.  Living environment; residence and Firearm Safety: 1-story house/ trailer, no firearms. Lives with husband, no needs for DME, good support system Seat Belt Safety/Bike Helmet: Wears seat belt.      Objective:    Today's Vitals   10/30/17 1418  BP: 132/74  Pulse: 75  Resp: 18  Temp: 98.9 F (37.2 C)  SpO2: 98%  Weight: 144 lb (65.3 kg)  Height: 5\' 5"  (1.651 m)   Body mass index is 23.96 kg/m.  Advanced Directives 10/30/2017 10/27/2016 05/22/2013 05/15/2012 05/10/2012  Does Patient Have a Medical Advance Directive? Yes Yes Patient has advance directive, copy not in chart Patient has advance directive, copy not in chart Patient has advance directive, copy not in chart  Type of Advance Directive [place];Living will [place];Living will Living will;Healthcare Power of [place];Living will  Copy of [place] in Chart? No - copy requested No - copy requested - Copy requested from other (Comment) -    Current Medications (verified) Outpatient Encounter Medications as of 10/30/2017  Medication Sig  . acetaminophen (TYLENOL) 500 MG tablet Take 1,000 mg by mouth every 6 (six) hours as needed for pain.  . Biotin 1000 MCG tablet Take by mouth.  . calcium gluconate 500 MG tablet Take 500 mg by mouth 2 (two) times daily.    . carisoprodol (SOMA) 350 MG  tablet Take 1 tablet (350 mg total) by mouth at bedtime.  . diclofenac sodium (VOLTAREN) 1 % GEL Voltaren 1 % topical gel  APPLY 2 GRAM TO THE AFFECTED AREA(S) BY TOPICAL ROUTE 4 TIMES PER DAY  . hydrochlorothiazide (MICROZIDE) 12.5 MG capsule Take 1 capsule by mouth daily  . meclizine (ANTIVERT) 25 MG tablet Take 25 mg by mouth 3 (three) times daily as needed for dizziness.  . simvastatin (ZOCOR) 20 MG tablet Take 1 tablet by mouth every day at 6pm  . tizanidine (ZANAFLEX) 2 MG capsule Take 2 mg by mouth 3 (three) times daily as needed for muscle spasms.  . [DISCONTINUED] omeprazole (PRILOSEC) 20 MG capsule Take 1 capsule (20 mg total) by mouth daily. (Patient not taking: Reported on 10/30/2017)   No facility-administered encounter medications on file as of 10/30/2017.     Allergies (verified) Cefuroxime; Cephalosporins; Latex; Celexa [citalopram hydrobromide]; Dexlansoprazole; Lexapro [escitalopram oxalate]; Penicillins; Sulfonamide derivatives; and Vancomycin   History: Past Medical History:  Diagnosis Date  . Allergic rhinitis, cause unspecified 01/29/2013  . Anemia 1948?  Marland Kitchen BACK PAIN, CHRONIC 02/09/2010  . Cancer ([place])    SKIN  . Cataract   . COLONIC POLYPS, HX OF 02/09/2010  . COMMON MIGRAINE 02/09/2010   neuritis in R side of neck & head  . DEGENERATIVE JOINT DISEASE 02/09/2010   hands , lumbar stenosis   . DEPRESSION 02/09/2010   pt. takes Cymbalta for pain issues   . GERD 02/09/2010  . HYPERLIPIDEMIA 02/09/2010  . HYPERTENSION 02/09/2010  . Lumbar  disc disease   . Neuromuscular disorder ([place])    neuritis in head & neck  . OCCIPITAL NEURALGIA 06/09/2010  . OSTEOPENIA 02/09/2010  . Pneumonia    "as an infant" (05/15/2012)  . Varicose vein of leg    BLE   Past Surgical History:  Procedure Laterality Date  . BREAST BIOPSY  1988   left; Neg  . CHOLECYSTECTOMY  ? 2009  . chymopapain injection  1986   Lumbar  . COLONOSCOPY    . DILATION AND CURETTAGE OF UTERUS  1960's  .  HERNIA REPAIR  05/15/2012   ventral incisional   . LUMBAR FUSION  05/22/2013   L2  L3 decompression /fusion      Dr Lynann Bologna  . LUMBAR LAMINECTOMY  1986  . OVARIAN CYST REMOVAL  2006  . Stimulator placed   07/2014   placed 2 years ago for pain  . TRANSFORAMINAL LUMBAR INTERBODY FUSION (TLIF) WITH PEDICLE SCREW FIXATION 1 LEVEL Left 05/22/2013   Procedure: TRANSFORAMINAL LUMBAR INTERBODY FUSION (TLIF) WITH PEDICLE SCREW FIXATION 1 LEVEL;  Surgeon: Sinclair Ship, MD;  Location: [place];  Service: Orthopedics;  Laterality: Left;  Left sided lumbar 2-3 Transforaminal lumbar interbody fusion with instrumentation, allograft  . TUBAL LIGATION  1970's  . VARICOSE VEIN SURGERY  ~ 2008   bilaterally  . VENTRAL HERNIA REPAIR  05/15/2012   Procedure: LAPAROSCOPIC VENTRAL HERNIA;  Surgeon: Gayland Curry, MD,FACS;  Location: [facility];  Service: General;  Laterality: N/A;  laparoscopic ventral incisional hernia repair with mesh   Family History  Problem Relation Age of Onset  . Lung cancer Mother   . Cancer Mother        Lung  . COPD Mother   . Varicose Veins Mother   . Diabetes Father   . Cancer Son        Melanoma  . Breast cancer Maternal Grandmother   . Cancer Maternal Grandmother        breast  . Colon cancer Maternal Aunt    Social History   Socioeconomic History  . Marital status: Married    Spouse name: Not on file  . Number of children: 3  . Years of education: Not on file  . Highest education level: Not on file  Occupational History  . Not on file  Social Needs  . Financial resource strain: Not hard at all  . Food insecurity:    Worry: Never true    Inability: Never true  . Transportation needs:    Medical: No    Non-medical: No  Tobacco Use  . Smoking status: Former Smoker    Packs/day: 1.00    Years: 10.00    Pack years: 10.00    Types: Cigarettes    Last attempt to quit: 05/11/1971    Years since quitting: 46.5  . Smokeless tobacco: Never Used  Substance and  Sexual Activity  . Alcohol use: No    Alcohol/week: 1.8 oz    Types: 3 Glasses of wine per week    Comment: 05/15/2012 "wine 2-4 glasses/wk"  . Drug use: No  . Sexual activity: Not Currently  Lifestyle  . Physical activity:    Days per week: 0 days    Minutes per session: 0 min  . Stress: Not at all  Relationships  . Social connections:    Talks on phone: More than three times a week    Gets together: More than three times a week    Attends religious service:  More than 4 times per year    Active member of club or organization: Yes    Attends meetings of clubs or organizations: More than 4 times per year    Relationship status: Married  Other Topics Concern  . Not on file  Social History Narrative   Coffee daily     Tobacco Counseling Counseling given: Not Answered  Activities of Daily Living In your present state of health, do you have any difficulty performing the following activities: 10/30/2017  Hearing? N  Vision? N  Difficulty concentrating or making decisions? N  Walking or climbing stairs? N  Dressing or bathing? N  Doing errands, shopping? N  Preparing Food and eating ? N  Using the Toilet? N  In the past six months, have you accidently leaked urine? N  Do you have problems with loss of bowel control? N  Managing your Medications? N  Managing your Finances? N  Housekeeping or managing your Housekeeping? N  Some recent data might be hidden     Immunizations and Health Maintenance Immunization History  Administered Date(s) Administered  . Influenza Split 05/24/2011  . Influenza Whole 06/09/2010  . Influenza, High Dose Seasonal PF 04/29/2016, 04/13/2017  . Influenza,inj,Quad PF,6+ Mos 04/08/2014, 05/08/2015  . Influenza-Unspecified 04/24/2013  . Pneumococcal Conjugate-13 10/04/2013  . Pneumococcal Polysaccharide-23 07/25/2005  . Td 01/22/2002  . Tetanus 10/04/2013  . Zoster Recombinat (Shingrix) 05/31/2017   There are no preventive care reminders to  display for this patient.  Patient Care Team: Biagio Borg, MD as PCP - General Ardis Hughs Melene Plan, MD as Attending Physician (Gastroenterology) Phylliss Bob, MD as Consulting Physician (Orthopedic Surgery)  Indicate any recent Medical Services you may have received from other than [facility] providers in the past year (date may be approximate).     Assessment:   This is a routine wellness examination for Erland. Physical assessment deferred to PCP.   Hearing/Vision screen Hearing Screening Comments: HOH, Passed whisper test, states she will go to an audiologist she saw previously  Vision Screening Comments: appointment yearly, Dr. Jodi Mourning   Dietary issues and exercise activities discussed: Current Exercise Habits: Home exercise routine, Type of exercise: walking(swimming), Time (Minutes): 40, Frequency (Times/Week): 3, Weekly Exercise (Minutes/Week): 120, Intensity: Mild, Exercise limited by: orthopedic condition(s)  Diet (meal preparation, eat out, water intake, caffeinated beverages, dairy products, fruits and vegetables): in general, a "healthy" diet  , well balanced, eats a variety of fruits and vegetables daily, limits salt, fat/cholesterol, sugar,carbohydrates,caffeine, drinks 6-8 glasses of water daily.  Goals    . Patient Stated     Maintain current health status. Eat healthy and increase physical activity as tolerated by walking and swimming. Enjoy life, family, and [place] part time.       Depression Screen PHQ 2/9 Scores 10/30/2017 10/27/2016 01/14/2015 04/08/2014 10/04/2013  PHQ - 2 Score 0 0 1 1 1   PHQ- 9 Score 2 - - - -    Fall Risk Fall Risk  10/30/2017 10/27/2016 01/14/2015 04/08/2014 10/04/2013  Falls in the past year? No No No No No    Cognitive Function: MMSE - Mini Mental State Exam 10/30/2017 10/27/2016  Not completed: Refused -  Orientation to time - 5  Orientation to Place - 5  Registration - 3  Attention/ Calculation - 5  Recall - 1  Language- name 2  objects - 2  Language- repeat - 1  Language- follow 3 step command - 3  Language- read & follow direction -  1  Write a sentence - 1  Copy design - 1  Total score - 28       Ad8 score reviewed for issues:  Issues making decisions: no  Less interest in hobbies / activities: no  Repeats questions, stories (family complaining): no  Trouble using ordinary gadgets (microwave, computer, phone):no  Forgets the month or year: no  Mismanaging finances: no  Remembering appts: no  Daily problems with thinking and/or memory: no Ad8 score is= 0  Screening Tests Health Maintenance  Topic Date Due  . INFLUENZA VACCINE  02/22/2018  . TETANUS/TDAP  10/05/2023  . DEXA SCAN  Completed  . PNA vac Low Risk Adult  Completed      Plan:    Continue doing brain stimulating activities (puzzles, reading, adult coloring books, staying active) to keep memory sharp.   Continue to eat heart healthy diet (full of fruits, vegetables, whole grains, lean protein, water--limit salt, fat, and sugar intake) and increase physical activity as tolerated.   I have personally reviewed and noted the following in the patient's chart:   . Medical and social history . Use of alcohol, tobacco or illicit drugs  . Current medications and supplements . Functional ability and status . Nutritional status . Physical activity . Advanced directives . List of other physicians . Vitals . Screenings to include cognitive, depression, and falls . Referrals and appointments  In addition, I have reviewed and discussed with patient certain preventive protocols, quality metrics, and best practice recommendations. A written personalized care plan for preventive services as well as general preventive health recommendations were provided to patient.     Michiel Cowboy, RN   10/30/2017    Medical screening examination/treatment/procedure(s) were performed by non-physician practitioner and as supervising physician I was  immediately available for consultation/collaboration. I agree with above. Cathlean Cower, MD

## 2017-10-30 NOTE — Assessment & Plan Note (Signed)
BP Readings from Last 3 Encounters:  10/30/17 132/74  10/27/16 (!) 143/82  02/09/15 (!) 158/88  stable overall by history and exam, recent data reviewed with pt, and pt to continue medical treatment as before,  to f/u any worsening symptoms or concerns

## 2017-10-30 NOTE — Patient Instructions (Signed)

## 2017-10-30 NOTE — Assessment & Plan Note (Signed)
stable overall by history and exam, and pt to continue medical treatment as before,  to f/u any worsening symptoms or concerns 

## 2017-10-31 ENCOUNTER — Ambulatory Visit: Payer: Medicare Other | Admitting: Internal Medicine

## 2017-12-27 ENCOUNTER — Other Ambulatory Visit: Payer: Self-pay | Admitting: Internal Medicine

## 2018-02-12 DIAGNOSIS — M810 Age-related osteoporosis without current pathological fracture: Secondary | ICD-10-CM | POA: Diagnosis not present

## 2018-02-12 DIAGNOSIS — Z79899 Other long term (current) drug therapy: Secondary | ICD-10-CM | POA: Diagnosis not present

## 2018-02-12 DIAGNOSIS — Z1321 Encounter for screening for nutritional disorder: Secondary | ICD-10-CM | POA: Diagnosis not present

## 2018-02-12 DIAGNOSIS — Z87891 Personal history of nicotine dependence: Secondary | ICD-10-CM | POA: Diagnosis not present

## 2018-02-12 DIAGNOSIS — E559 Vitamin D deficiency, unspecified: Secondary | ICD-10-CM | POA: Diagnosis not present

## 2018-02-18 DIAGNOSIS — E785 Hyperlipidemia, unspecified: Secondary | ICD-10-CM | POA: Diagnosis not present

## 2018-02-18 DIAGNOSIS — Z888 Allergy status to other drugs, medicaments and biological substances status: Secondary | ICD-10-CM | POA: Diagnosis not present

## 2018-02-18 DIAGNOSIS — K219 Gastro-esophageal reflux disease without esophagitis: Secondary | ICD-10-CM | POA: Diagnosis not present

## 2018-02-18 DIAGNOSIS — I10 Essential (primary) hypertension: Secondary | ICD-10-CM | POA: Diagnosis not present

## 2018-02-18 DIAGNOSIS — R072 Precordial pain: Secondary | ICD-10-CM | POA: Diagnosis not present

## 2018-02-18 DIAGNOSIS — R079 Chest pain, unspecified: Secondary | ICD-10-CM | POA: Diagnosis not present

## 2018-02-18 DIAGNOSIS — Z883 Allergy status to other anti-infective agents status: Secondary | ICD-10-CM | POA: Diagnosis not present

## 2018-02-18 DIAGNOSIS — Z880 Allergy status to penicillin: Secondary | ICD-10-CM | POA: Diagnosis not present

## 2018-02-18 DIAGNOSIS — Z882 Allergy status to sulfonamides status: Secondary | ICD-10-CM | POA: Diagnosis not present

## 2018-02-18 DIAGNOSIS — Z79899 Other long term (current) drug therapy: Secondary | ICD-10-CM | POA: Diagnosis not present

## 2018-02-18 DIAGNOSIS — E7800 Pure hypercholesterolemia, unspecified: Secondary | ICD-10-CM | POA: Diagnosis not present

## 2018-02-18 DIAGNOSIS — D71 Functional disorders of polymorphonuclear neutrophils: Secondary | ICD-10-CM | POA: Diagnosis not present

## 2018-02-18 DIAGNOSIS — R0789 Other chest pain: Secondary | ICD-10-CM | POA: Diagnosis not present

## 2018-02-19 DIAGNOSIS — E785 Hyperlipidemia, unspecified: Secondary | ICD-10-CM | POA: Diagnosis not present

## 2018-02-19 DIAGNOSIS — K219 Gastro-esophageal reflux disease without esophagitis: Secondary | ICD-10-CM | POA: Diagnosis not present

## 2018-02-19 DIAGNOSIS — I10 Essential (primary) hypertension: Secondary | ICD-10-CM | POA: Diagnosis not present

## 2018-02-19 DIAGNOSIS — R079 Chest pain, unspecified: Secondary | ICD-10-CM | POA: Diagnosis not present

## 2018-02-21 ENCOUNTER — Ambulatory Visit (INDEPENDENT_AMBULATORY_CARE_PROVIDER_SITE_OTHER): Payer: Medicare Other | Admitting: Internal Medicine

## 2018-02-21 ENCOUNTER — Encounter: Payer: Self-pay | Admitting: Internal Medicine

## 2018-02-21 DIAGNOSIS — R42 Dizziness and giddiness: Secondary | ICD-10-CM | POA: Diagnosis not present

## 2018-02-21 DIAGNOSIS — I10 Essential (primary) hypertension: Secondary | ICD-10-CM | POA: Diagnosis not present

## 2018-02-21 DIAGNOSIS — M79645 Pain in left finger(s): Secondary | ICD-10-CM

## 2018-02-21 MED ORDER — GENERIC EXTERNAL MEDICATION
Status: DC
Start: ? — End: 2018-02-21

## 2018-02-21 MED ORDER — CARISOPRODOL 350 MG PO TABS
350.00 | ORAL_TABLET | ORAL | Status: DC
Start: ? — End: 2018-02-21

## 2018-02-21 MED ORDER — SODIUM CHLORIDE 0.9 % IV SOLN
10.00 | INTRAVENOUS | Status: DC
Start: ? — End: 2018-02-21

## 2018-02-21 MED ORDER — HYDROCHLOROTHIAZIDE 12.5 MG PO TABS
12.50 | ORAL_TABLET | ORAL | Status: DC
Start: 2018-02-20 — End: 2018-02-21

## 2018-02-21 MED ORDER — ASPIRIN EC 81 MG PO TBEC
81.00 | DELAYED_RELEASE_TABLET | ORAL | Status: DC
Start: 2018-02-20 — End: 2018-02-21

## 2018-02-21 MED ORDER — ENOXAPARIN SODIUM 40 MG/0.4ML ~~LOC~~ SOLN
40.00 | SUBCUTANEOUS | Status: DC
Start: 2018-02-20 — End: 2018-02-21

## 2018-02-21 MED ORDER — MORPHINE SULFATE (PF) 4 MG/ML IV SOLN
2.00 | INTRAVENOUS | Status: DC
Start: ? — End: 2018-02-21

## 2018-02-21 MED ORDER — NITROGLYCERIN 0.4 MG SL SUBL
0.40 | SUBLINGUAL_TABLET | SUBLINGUAL | Status: DC
Start: ? — End: 2018-02-21

## 2018-02-21 MED ORDER — DICLOFENAC SODIUM 1 % TD GEL: 2.0000 g | Freq: Four times a day (QID) | TRANSDERMAL | 5 refills | Status: DC | PRN

## 2018-02-21 MED ORDER — GENERIC EXTERNAL MEDICATION
35.00 | Status: DC
Start: ? — End: 2018-02-21

## 2018-02-21 MED ORDER — SENNA-DOCUSATE SODIUM 8.6-50 MG PO TABS
1.00 | ORAL_TABLET | ORAL | Status: DC
Start: ? — End: 2018-02-21

## 2018-02-21 MED ORDER — ATORVASTATIN CALCIUM 20 MG PO TABS
20.00 | ORAL_TABLET | ORAL | Status: DC
Start: 2018-02-19 — End: 2018-02-21

## 2018-02-21 NOTE — Patient Instructions (Addendum)
Please take all new medication as prescribed  - the gel  Please continue all other medications as before, including the antivert you have at home  Please have the pharmacy call with any other refills you may need.  Please continue your efforts at being more active, low cholesterol diet, and weight control.  Please keep your appointments with your specialists as you may have planned - ENT tomorrow

## 2018-02-21 NOTE — Assessment & Plan Note (Signed)
Mild, likely degerative change use with overuse sometimes, for volt gel prn

## 2018-02-21 NOTE — Assessment & Plan Note (Signed)
Mild recurrent but bothersome, ok to take the prn antivert and f/u with ENT in the AM

## 2018-02-21 NOTE — Assessment & Plan Note (Signed)
stable overall by history and exam, recent data reviewed with pt, and pt to continue medical treatment as before,  to f/u any worsening symptoms or concerns BP Readings from Last 3 Encounters:  02/21/18 126/74  10/30/17 132/74  10/27/16 (!) 143/82

## 2018-02-21 NOTE — Progress Notes (Signed)
Subjective:    Patient ID: Crystal Herrera, female    DOB: Nov 03, 1940, 77 y.o.   MRN: [id number]  HPI   Here to f/u recent CP seen at [facility] ED 7.28 ([facility]) where it was noted: patient reports CP that began last night around 2100 hour, patient reports Pepcid relieved her pain and was able to sleep throughout the night, then woke this morning with same central CP that radiates to her back , troponins neg x 4.  Today she relates pain started pressure like the night before, then went to the ED next days.  Pain resolved while in ED, no other radiation, and no assoc  N/v, sob, heart racing and dizziness.  Kept overnight, ecg negative for acute, stress test low risk/negative, and cxr neg.  No further CP since then, but does now have posititonal dizziness like room spinning, worse with swimming this am.  Coincidently has ENT f/u tomorrow for tinnitus.  Also has recurring left thumb base pain, mild, intemittent, sharp, worse after using the hands Past Medical History:  Diagnosis Date  . Allergic rhinitis, cause unspecified 01/29/2013  . Anemia 1948?  Marland Kitchen BACK PAIN, CHRONIC 02/09/2010  . Cancer ([place])    SKIN  . Cataract   . COLONIC POLYPS, HX OF 02/09/2010  . COMMON MIGRAINE 02/09/2010   neuritis in R side of neck & head  . DEGENERATIVE JOINT DISEASE 02/09/2010   hands , lumbar stenosis   . DEPRESSION 02/09/2010   pt. takes Cymbalta for pain issues   . GERD 02/09/2010  . HYPERLIPIDEMIA 02/09/2010  . HYPERTENSION 02/09/2010  . Lumbar disc disease   . Neuromuscular disorder ([place])    neuritis in head & neck  . OCCIPITAL NEURALGIA 06/09/2010  . OSTEOPENIA 02/09/2010  . Pneumonia    "as an infant" (05/15/2012)  . Varicose vein of leg    BLE   Past Surgical History:  Procedure Laterality Date  . BREAST BIOPSY  1988   left; Neg  . CHOLECYSTECTOMY  ? 2009  . chymopapain injection  1986   Lumbar  . COLONOSCOPY    . DILATION AND CURETTAGE OF UTERUS  1960's  . HERNIA REPAIR  05/15/2012   ventral  incisional   . LUMBAR FUSION  05/22/2013   L2  L3 decompression /fusion      Dr Lynann Bologna  . LUMBAR LAMINECTOMY  1986  . OVARIAN CYST REMOVAL  2006  . Stimulator placed   07/2014   placed 2 years ago for pain  . TRANSFORAMINAL LUMBAR INTERBODY FUSION (TLIF) WITH PEDICLE SCREW FIXATION 1 LEVEL Left 05/22/2013   Procedure: TRANSFORAMINAL LUMBAR INTERBODY FUSION (TLIF) WITH PEDICLE SCREW FIXATION 1 LEVEL;  Surgeon: Sinclair Ship, MD;  Location: [place];  Service: Orthopedics;  Laterality: Left;  Left sided lumbar 2-3 Transforaminal lumbar interbody fusion with instrumentation, allograft  . TUBAL LIGATION  1970's  . VARICOSE VEIN SURGERY  ~ 2008   bilaterally  . VENTRAL HERNIA REPAIR  05/15/2012   Procedure: LAPAROSCOPIC VENTRAL HERNIA;  Surgeon: Gayland Curry, MD,FACS;  Location: [place];  Service: General;  Laterality: N/A;  laparoscopic ventral incisional hernia repair with mesh    reports that she quit smoking about 46 years ago. Her smoking use included cigarettes. She has a 10.00 pack-year smoking history. She has never used smokeless tobacco. She reports that she does not drink alcohol or use drugs. family history includes Breast cancer in her maternal grandmother; COPD in her mother; Cancer in her maternal grandmother,  mother, and son; Colon cancer in her maternal aunt; Diabetes in her father; Lung cancer in her mother; Varicose Veins in her mother. Allergies  Allergen Reactions  . Cefuroxime     Might cause hives  . Cephalosporins     Might cause hives   . Latex Nausea Only  . Celexa [Citalopram Hydrobromide] Nausea Only  . Dexlansoprazole Anxiety    Sleeplessness heart racing headaches chiulls  . Lexapro [Escitalopram Oxalate] Nausea Only  . Penicillins Hives  . Sulfonamide Derivatives Hives  . Vancomycin Rash   Current Outpatient Medications on File Prior to Visit  Medication Sig Dispense Refill  . Biotin 1000 MCG tablet Take by mouth.    . calcium gluconate 500 MG  tablet Take 500 mg by mouth 2 (two) times daily.      . hydrochlorothiazide (MICROZIDE) 12.5 MG capsule Take 1 capsule by mouth daily 90 capsule 1  . meclizine (ANTIVERT) 25 MG tablet Take 25 mg by mouth 3 (three) times daily as needed for dizziness.    . simvastatin (ZOCOR) 20 MG tablet Take 1 tablet by mouth every day at 6pm 90 tablet 1   No current facility-administered medications on file prior to visit.     Review of Systems  Constitutional: Negative for other unusual diaphoresis or sweats HENT: Negative for ear discharge or swelling Eyes: Negative for other worsening visual disturbances Respiratory: Negative for stridor or other swelling  Gastrointestinal: Negative for worsening distension or other blood Genitourinary: Negative for retention or other urinary change Musculoskeletal: Negative for other MSK pain or swelling Skin: Negative for color change or other new lesions Neurological: Negative for worsening tremors and other numbness  Psychiatric/Behavioral: Negative for worsening agitation or other fatigue All other system neg per pt    Objective:   Physical Exam BP 126/74   Pulse 83   Temp 98.4 F (36.9 C) (Oral)   Ht 5\' 5"  (1.651 m)   Wt 145 lb (65.8 kg)   SpO2 97%   BMI 24.13 kg/m  VS noted,  Constitutional: Pt appears in NAD HENT: Head: NCAT.  Right Ear: External ear normal.  Left Ear: External ear normal.  bilat TM's clear Eyes: . Pupils are equal, round, and reactive to light. Conjunctivae and EOM are normal Nose: without d/c or deformity Neck: Neck supple. Gross normal ROM Cardiovascular: Normal rate and regular rhythm.   Pulmonary/Chest: Effort normal and breath sounds without rales or wheezing.  Abd:  Soft, NT, ND, + BS, no organomegaly Neurological: Pt is alert. At baseline orientation, motor grossly intact Skin: Skin is warm. No rashes, other new lesions, no LE edema Psychiatric: Pt behavior is normal without agitation  No other exam findings      Assessment & Plan:

## 2018-02-22 DIAGNOSIS — H8110 Benign paroxysmal vertigo, unspecified ear: Secondary | ICD-10-CM | POA: Diagnosis not present

## 2018-02-22 DIAGNOSIS — H903 Sensorineural hearing loss, bilateral: Secondary | ICD-10-CM | POA: Diagnosis not present

## 2018-02-22 DIAGNOSIS — R42 Dizziness and giddiness: Secondary | ICD-10-CM | POA: Diagnosis not present

## 2018-02-22 DIAGNOSIS — H9313 Tinnitus, bilateral: Secondary | ICD-10-CM | POA: Diagnosis not present

## 2018-02-22 DIAGNOSIS — H905 Unspecified sensorineural hearing loss: Secondary | ICD-10-CM | POA: Diagnosis not present

## 2018-02-22 DIAGNOSIS — H838X3 Other specified diseases of inner ear, bilateral: Secondary | ICD-10-CM | POA: Diagnosis not present

## 2018-02-22 DIAGNOSIS — H9319 Tinnitus, unspecified ear: Secondary | ICD-10-CM | POA: Diagnosis not present

## 2018-02-23 DIAGNOSIS — H81399 Other peripheral vertigo, unspecified ear: Secondary | ICD-10-CM | POA: Diagnosis not present

## 2018-03-28 DIAGNOSIS — E559 Vitamin D deficiency, unspecified: Secondary | ICD-10-CM | POA: Diagnosis not present

## 2018-03-28 DIAGNOSIS — M810 Age-related osteoporosis without current pathological fracture: Secondary | ICD-10-CM | POA: Diagnosis not present

## 2018-03-30 ENCOUNTER — Emergency Department (INDEPENDENT_AMBULATORY_CARE_PROVIDER_SITE_OTHER)
Admission: EM | Admit: 2018-03-30 | Discharge: 2018-03-30 | Disposition: A | Discharge status: Home / Self Care | Payer: Medicare Other | Source: Home / Self Care | Attending: Family Medicine | Admitting: Family Medicine

## 2018-03-30 ENCOUNTER — Other Ambulatory Visit: Payer: Self-pay

## 2018-03-30 DIAGNOSIS — H6121 Impacted cerumen, right ear: Secondary | ICD-10-CM | POA: Diagnosis not present

## 2018-03-30 MED ORDER — CARBAMIDE PEROXIDE 6.5 % OT SOLN: 5.0000 [drp] | Freq: Two times a day (BID) | OTIC | 0 refills | Status: DC | PRN

## 2018-03-30 NOTE — Discharge Instructions (Signed)
°  You may try the Debrox ear drops to help with earwax buildup and water sensation in your ear canals. Please follow up with family medicine or your ENT as needed.

## 2018-03-30 NOTE — ED Triage Notes (Signed)
Pt was at sons house in [place] last weekend. Went swimming in their inground pool. Noticed fluid in RT ear. Has no tried any OTC drops. Denies any pain.

## 2018-03-30 NOTE — ED Provider Notes (Signed)
[facility]    CSN: [id number] Arrival date & time: 03/30/18  1018     History   Chief Complaint Chief Complaint  Patient presents with  . Ear Fullness    HPI Crystal Herrera is a 77 y.o. female.   HPI  Crystal Herrera is a 77 y.o. female presenting to UC with c/o Right ear fullness for the last few days after going swimming in a pool while visiting her son in [place].  She has tried lying on her Right side in hopes any water in her ear would drain on its own, but no relief.  Denies pain at this time or change in hearing, just "fullness".  Denies cough, congestion, sore throat, or sinus pain or pressure. No pain or fullness in left ear. She has had cerumen impaction several years ago. She is leaving for out of town soon and hopes to have this sensation resolved.    Past Medical History:  Diagnosis Date  . Allergic rhinitis, cause unspecified 01/29/2013  . Anemia 1948?  Marland Kitchen BACK PAIN, CHRONIC 02/09/2010  . Cancer ([place])    SKIN  . Cataract   . COLONIC POLYPS, HX OF 02/09/2010  . COMMON MIGRAINE 02/09/2010   neuritis in R side of neck & head  . DEGENERATIVE JOINT DISEASE 02/09/2010   hands , lumbar stenosis   . DEPRESSION 02/09/2010   pt. takes Cymbalta for pain issues   . GERD 02/09/2010  . HYPERLIPIDEMIA 02/09/2010  . HYPERTENSION 02/09/2010  . Lumbar disc disease   . Neuromuscular disorder ([place])    neuritis in head & neck  . OCCIPITAL NEURALGIA 06/09/2010  . OSTEOPENIA 02/09/2010  . Pneumonia    "as an infant" (05/15/2012)  . Varicose vein of leg    BLE    Patient Active Problem List   Diagnosis Date Noted  . Thumb pain, left 02/21/2018  . Palpitations 01/14/2015  . Gastritis and gastroduodenitis 10/31/2014  . Chronic mesenteric ischemia ([place]) 03/12/2014  . Vertigo 01/29/2013  . Allergic rhinitis, cause unspecified 01/29/2013  . OCCIPITAL NEURALGIA 06/09/2010  . Hyperlipidemia 02/09/2010  . Depression 02/09/2010  . COMMON MIGRAINE 02/09/2010  .  Essential hypertension 02/09/2010  . GERD 02/09/2010  . DEGENERATIVE JOINT DISEASE 02/09/2010  . BACK PAIN, CHRONIC 02/09/2010  . OSTEOPENIA 02/09/2010  . FATIGUE 02/09/2010  . COLONIC POLYPS, HX OF 02/09/2010    Past Surgical History:  Procedure Laterality Date  . BREAST BIOPSY  1988   left; Neg  . CHOLECYSTECTOMY  ? 2009  . chymopapain injection  1986   Lumbar  . COLONOSCOPY    . DILATION AND CURETTAGE OF UTERUS  1960's  . HERNIA REPAIR  05/15/2012   ventral incisional   . LUMBAR FUSION  05/22/2013   L2  L3 decompression /fusion      Dr Lynann Bologna  . LUMBAR LAMINECTOMY  1986  . OVARIAN CYST REMOVAL  2006  . Stimulator placed   07/2014   placed 2 years ago for pain  . TRANSFORAMINAL LUMBAR INTERBODY FUSION (TLIF) WITH PEDICLE SCREW FIXATION 1 LEVEL Left 05/22/2013   Procedure: TRANSFORAMINAL LUMBAR INTERBODY FUSION (TLIF) WITH PEDICLE SCREW FIXATION 1 LEVEL;  Surgeon: Sinclair Ship, MD;  Location: [facility];  Service: Orthopedics;  Laterality: Left;  Left sided lumbar 2-3 Transforaminal lumbar interbody fusion with instrumentation, allograft  . TUBAL LIGATION  1970's  . VARICOSE VEIN SURGERY  ~ 2008   bilaterally  . VENTRAL HERNIA REPAIR  05/15/2012   Procedure: LAPAROSCOPIC  VENTRAL HERNIA;  Surgeon: Gayland Curry, MD,FACS;  Location: [facility];  Service: General;  Laterality: N/A;  laparoscopic ventral incisional hernia repair with mesh    OB History   None      Home Medications    Prior to Admission medications   Medication Sig Start Date End Date Taking? Authorizing Provider  Biotin 1000 MCG tablet Take by mouth.    [provider]  calcium gluconate 500 MG tablet Take 500 mg by mouth 2 (two) times daily.      [provider]  carbamide peroxide (DEBROX) 6.5 % OTIC solution Place 5 drops into both ears 2 (two) times daily as needed. 03/30/18   Noe Gens, PA-C  diclofenac sodium (VOLTAREN) 1 % GEL Apply 2 g topically 4 (four) times daily as  needed. 02/21/18   Biagio Borg, MD  hydrochlorothiazide (MICROZIDE) 12.5 MG capsule Take 1 capsule by mouth daily 12/28/17   Biagio Borg, MD  meclizine (ANTIVERT) 25 MG tablet Take 25 mg by mouth 3 (three) times daily as needed for dizziness.    [provider]  simvastatin (ZOCOR) 20 MG tablet Take 1 tablet by mouth every day at 6pm 12/28/17   Biagio Borg, MD    Family History Family History  Problem Relation Age of Onset  . Lung cancer Mother   . Cancer Mother        Lung  . COPD Mother   . Varicose Veins Mother   . Diabetes Father   . Cancer Son        Melanoma  . Breast cancer Maternal Grandmother   . Cancer Maternal Grandmother        breast  . Colon cancer Maternal Aunt     Social History Social History   Tobacco Use  . Smoking status: Former Smoker    Packs/day: 1.00    Years: 10.00    Pack years: 10.00    Types: Cigarettes    Last attempt to quit: 05/11/1971    Years since quitting: 46.9  . Smokeless tobacco: Never Used  Substance Use Topics  . Alcohol use: No    Alcohol/week: 3.0 standard drinks    Types: 3 Glasses of wine per week    Comment: 05/15/2012 "wine 2-4 glasses/wk"  . Drug use: No     Allergies   Cefuroxime; Cephalosporins; Latex; Celexa [citalopram hydrobromide]; Dexlansoprazole; Lexapro [escitalopram oxalate]; Penicillins; Sulfonamide derivatives; and Vancomycin   Review of Systems Review of Systems  Constitutional: Negative for chills and fever.  HENT: Positive for ear pain (Right ear fullness). Negative for congestion, ear discharge and hearing loss.   Respiratory: Negative for cough.   Neurological: Negative for dizziness and headaches.     Physical Exam Triage Vital Signs ED Triage Vitals  Enc Vitals Group     BP 03/30/18 1031 132/81     Pulse Rate 03/30/18 1031 80     Resp --      Temp 03/30/18 1031 98.3 F (36.8 C)     Temp Source 03/30/18 1031 Oral     SpO2 03/30/18 1031 97 %     Weight 03/30/18 1032 144 lb  (65.3 kg)     Height 03/30/18 1032 5\' 5"  (1.651 m)     Head Circumference --      Peak Flow --      Pain Score 03/30/18 1032 0     Pain Loc --      Pain Edu? --  Excl. in GC? --    No data found.  Updated Vital Signs BP 132/81 (BP Location: Right Arm)   Pulse 80   Temp 98.3 F (36.8 C) (Oral)   Ht 5\' 5"  (1.651 m)   Wt 144 lb (65.3 kg)   SpO2 97%   BMI 23.96 kg/m   Visual Acuity Right Eye Distance:   Left Eye Distance:   Bilateral Distance:    Right Eye Near:   Left Eye Near:    Bilateral Near:     Physical Exam  Constitutional: She is oriented to person, place, and time. She appears well-developed and well-nourished.  HENT:  Head: Normocephalic and atraumatic.  Right Ear: Tympanic membrane normal.  Left Ear: Tympanic membrane normal.  Nose: Nose normal. Right sinus exhibits no maxillary sinus tenderness and no frontal sinus tenderness. Left sinus exhibits no maxillary sinus tenderness and no frontal sinus tenderness.  Mouth/Throat: Uvula is midline, oropharynx is clear and moist and mucous membranes are normal.  Right ear: moderate amount of yellow wax in canal. No bleeding. TM- normal Left ear: small amount of wax. TM- normal.  Eyes: EOM are normal.  Neck: Normal range of motion. Neck supple.  Cardiovascular: Normal rate.  Pulmonary/Chest: Effort normal.  Musculoskeletal: Normal range of motion.  Lymphadenopathy:    She has no cervical adenopathy.  Neurological: She is alert and oriented to person, place, and time.  Skin: Skin is warm and dry.  Psychiatric: She has a normal mood and affect. Her behavior is normal.  Nursing note and vitals reviewed.    UC Treatments / Results  Labs (all labs ordered are listed, but only abnormal results are displayed) Labs Reviewed - No data to display  EKG None  Radiology No results found.  Procedures Procedures (including critical care time)  Medications Ordered in UC Medications - No data to  display  Initial Impression / Assessment and Plan / UC Course  I have reviewed the triage vital signs and the nursing notes.  Pertinent labs & imaging results that were available during my care of the patient were reviewed by me and considered in my medical decision making (see chart for details).     Right ear fullness c/w cerumen impaction Cerumen removed by CMA without complication No underlying infection noted. Home care instructions provided.  Final Clinical Impressions(s) / UC Diagnoses   Final diagnoses:  Impacted cerumen of right ear     Discharge Instructions      You may try the Debrox ear drops to help with earwax buildup and water sensation in your ear canals. Please follow up with family medicine or your ENT as needed.    ED Prescriptions    Medication Sig Dispense Auth. Provider   carbamide peroxide (DEBROX) 6.5 % OTIC solution Place 5 drops into both ears 2 (two) times daily as needed. 15 mL Noe Gens, PA-C     Controlled Substance Prescriptions [place] Controlled Substance Registry consulted? Not Applicable   Noe Gens, PA-C 03/30/18 1155

## 2018-04-13 DIAGNOSIS — L821 Other seborrheic keratosis: Secondary | ICD-10-CM | POA: Diagnosis not present

## 2018-04-13 DIAGNOSIS — D1801 Hemangioma of skin and subcutaneous tissue: Secondary | ICD-10-CM | POA: Diagnosis not present

## 2018-04-13 DIAGNOSIS — Z08 Encounter for follow-up examination after completed treatment for malignant neoplasm: Secondary | ICD-10-CM | POA: Diagnosis not present

## 2018-04-13 DIAGNOSIS — Z85828 Personal history of other malignant neoplasm of skin: Secondary | ICD-10-CM | POA: Diagnosis not present

## 2018-04-13 DIAGNOSIS — L570 Actinic keratosis: Secondary | ICD-10-CM | POA: Diagnosis not present

## 2018-04-24 DIAGNOSIS — M810 Age-related osteoporosis without current pathological fracture: Secondary | ICD-10-CM | POA: Diagnosis not present

## 2018-05-12 DIAGNOSIS — Z23 Encounter for immunization: Secondary | ICD-10-CM | POA: Diagnosis not present

## 2018-06-05 ENCOUNTER — Telehealth: Payer: Self-pay

## 2018-06-05 NOTE — Telephone Encounter (Signed)
Vaccine been documented in immunizations tab.   Copied from [place] [phone]. Topic: General - Other >> Jun 05, 2018  2:58 PM Rayann Heman wrote: Reason for CRM: pt received flu shot 04/24/18 at [facility]

## 2018-06-07 ENCOUNTER — Other Ambulatory Visit: Payer: Self-pay | Admitting: Internal Medicine

## 2018-06-18 DIAGNOSIS — Z1231 Encounter for screening mammogram for malignant neoplasm of breast: Secondary | ICD-10-CM | POA: Diagnosis not present

## 2018-08-14 DIAGNOSIS — I10 Essential (primary) hypertension: Secondary | ICD-10-CM | POA: Diagnosis not present

## 2018-08-14 DIAGNOSIS — M1711 Unilateral primary osteoarthritis, right knee: Secondary | ICD-10-CM | POA: Diagnosis not present

## 2018-09-12 ENCOUNTER — Other Ambulatory Visit: Payer: Self-pay | Admitting: Internal Medicine

## 2018-09-12 DIAGNOSIS — M1711 Unilateral primary osteoarthritis, right knee: Secondary | ICD-10-CM | POA: Diagnosis not present

## 2018-10-02 DIAGNOSIS — M1711 Unilateral primary osteoarthritis, right knee: Secondary | ICD-10-CM | POA: Diagnosis not present

## 2018-10-05 ENCOUNTER — Other Ambulatory Visit: Payer: Self-pay | Admitting: Internal Medicine

## 2018-12-25 ENCOUNTER — Other Ambulatory Visit: Payer: Self-pay | Admitting: Internal Medicine

## 2018-12-26 ENCOUNTER — Ambulatory Visit (INDEPENDENT_AMBULATORY_CARE_PROVIDER_SITE_OTHER): Payer: Medicare Other | Admitting: Internal Medicine

## 2018-12-26 ENCOUNTER — Other Ambulatory Visit: Payer: Self-pay

## 2018-12-26 ENCOUNTER — Encounter: Payer: Self-pay | Admitting: Internal Medicine

## 2018-12-26 DIAGNOSIS — E538 Deficiency of other specified B group vitamins: Secondary | ICD-10-CM | POA: Diagnosis not present

## 2018-12-26 DIAGNOSIS — E559 Vitamin D deficiency, unspecified: Secondary | ICD-10-CM | POA: Diagnosis not present

## 2018-12-26 DIAGNOSIS — I10 Essential (primary) hypertension: Secondary | ICD-10-CM | POA: Diagnosis not present

## 2018-12-26 DIAGNOSIS — E611 Iron deficiency: Secondary | ICD-10-CM

## 2018-12-26 DIAGNOSIS — F329 Major depressive disorder, single episode, unspecified: Secondary | ICD-10-CM | POA: Diagnosis not present

## 2018-12-26 DIAGNOSIS — E785 Hyperlipidemia, unspecified: Secondary | ICD-10-CM | POA: Diagnosis not present

## 2018-12-26 DIAGNOSIS — F32A Depression, unspecified: Secondary | ICD-10-CM

## 2018-12-26 MED ORDER — SIMVASTATIN 20 MG PO TABS: ORAL_TABLET | ORAL | 3 refills | Status: DC

## 2018-12-26 NOTE — Assessment & Plan Note (Signed)
stable overall by history and exam, recent data reviewed with pt, and pt to continue medical treatment as before,  to f/u any worsening symptoms or concerns  

## 2018-12-26 NOTE — Patient Instructions (Signed)
Please continue all other medications as before, and refills have been done if requested.  Please have the pharmacy call with any other refills you may need.  Please continue your efforts at being more active, low cholesterol diet, and weight control.  You are otherwise up to date with prevention measures today.  Please keep your appointments with your specialists as you may have planned  Please go to the LAB in the Basement (turn left off the elevator) for the tests to be done tomorrow as you mentioned  You will be contacted by phone if any changes need to be made immediately.  Otherwise, you will receive a letter about your results with an explanation, but please check with MyChart first.  Please remember to sign up for MyChart if you have not done so, as this will be important to you in the future with finding out test results, communicating by private email, and scheduling acute appointments online when needed.  Please return in 1 year for your yearly visit, or sooner if needed

## 2018-12-26 NOTE — Progress Notes (Signed)
Patient ID: Crystal Herrera, female   DOB: 1941/06/13, 78 y.o.   MRN: [id number]  Virtual Visit via Video Note  I connected with Ambrose Mantle on 12/26/18 at  4:00 PM EDT by a video enabled telemedicine application and verified that I am speaking with the correct person using two identifiers.  Location: Patient: at home Provider: at office   I discussed the limitations of evaluation and management by telemedicine and the availability of in person appointments. The patient expressed understanding and agreed to proceed.  History of Present Illness: Here for yearly f/u;  Overall doing ok;  Pt denies Chest pain, worsening SOB, DOE, wheezing, orthopnea, PND, worsening LE edema, palpitations, dizziness or syncope.  Pt denies neurological change such as new headache, facial or extremity weakness.  Pt denies polydipsia, polyuria, or low sugar symptoms. Pt states overall good compliance with treatment and medications, good tolerability, and has been trying to follow appropriate diet.  Pt denies worsening depressive symptoms, suicidal ideation or panic. No fever, night sweats, wt loss, loss of appetite, or other constitutional symptoms.  Pt states good ability with ADL's, has low fall risk, home safety reviewed and adequate, no other significant changes in hearing or vision, and only occasionally active with exercise. No new complaints.  BP 124/87 and HR 84 at home today Past Medical History:  Diagnosis Date  . Allergic rhinitis, cause unspecified 01/29/2013  . Anemia 1948?  Marland Kitchen BACK PAIN, CHRONIC 02/09/2010  . Cancer ([place])    SKIN  . Cataract   . COLONIC POLYPS, HX OF 02/09/2010  . COMMON MIGRAINE 02/09/2010   neuritis in R side of neck & head  . DEGENERATIVE JOINT DISEASE 02/09/2010   hands , lumbar stenosis   . DEPRESSION 02/09/2010   pt. takes Cymbalta for pain issues   . GERD 02/09/2010  . HYPERLIPIDEMIA 02/09/2010  . HYPERTENSION 02/09/2010  . Lumbar disc disease   . Neuromuscular disorder  ([place])    neuritis in head & neck  . OCCIPITAL NEURALGIA 06/09/2010  . OSTEOPENIA 02/09/2010  . Pneumonia    "as an infant" (05/15/2012)  . Varicose vein of leg    BLE   Past Surgical History:  Procedure Laterality Date  . BREAST BIOPSY  1988   left; Neg  . CHOLECYSTECTOMY  ? 2009  . chymopapain injection  1986   Lumbar  . COLONOSCOPY    . DILATION AND CURETTAGE OF UTERUS  1960's  . HERNIA REPAIR  05/15/2012   ventral incisional   . LUMBAR FUSION  05/22/2013   L2  L3 decompression /fusion      Dr Lynann Bologna  . LUMBAR LAMINECTOMY  1986  . OVARIAN CYST REMOVAL  2006  . Stimulator placed   07/2014   placed 2 years ago for pain  . TRANSFORAMINAL LUMBAR INTERBODY FUSION (TLIF) WITH PEDICLE SCREW FIXATION 1 LEVEL Left 05/22/2013   Procedure: TRANSFORAMINAL LUMBAR INTERBODY FUSION (TLIF) WITH PEDICLE SCREW FIXATION 1 LEVEL;  Surgeon: Sinclair Ship, MD;  Location: [facility];  Service: Orthopedics;  Laterality: Left;  Left sided lumbar 2-3 Transforaminal lumbar interbody fusion with instrumentation, allograft  . TUBAL LIGATION  1970's  . VARICOSE VEIN SURGERY  ~ 2008   bilaterally  . VENTRAL HERNIA REPAIR  05/15/2012   Procedure: LAPAROSCOPIC VENTRAL HERNIA;  Surgeon: Gayland Curry, MD,FACS;  Location: [facility];  Service: General;  Laterality: N/A;  laparoscopic ventral incisional hernia repair with mesh    reports that she quit smoking about 47 years  ago. Her smoking use included cigarettes. She has a 10.00 pack-year smoking history. She has never used smokeless tobacco. She reports that she does not drink alcohol or use drugs. family history includes Breast cancer in her maternal grandmother; COPD in her mother; Cancer in her maternal grandmother, mother, and son; Colon cancer in her maternal aunt; Diabetes in her father; Lung cancer in her mother; Varicose Veins in her mother. Allergies  Allergen Reactions  . Cefuroxime     Might cause hives  . Cephalosporins     Might cause hives    . Latex Nausea Only  . Celexa [Citalopram Hydrobromide] Nausea Only  . Dexlansoprazole Anxiety    Sleeplessness heart racing headaches chiulls  . Lexapro [Escitalopram Oxalate] Nausea Only  . Penicillins Hives  . Sulfonamide Derivatives Hives  . Vancomycin Rash   Current Outpatient Medications on File Prior to Visit  Medication Sig Dispense Refill  . Biotin 1000 MCG tablet Take by mouth.    . calcium gluconate 500 MG tablet Take 500 mg by mouth 2 (two) times daily.      . carbamide peroxide (DEBROX) 6.5 % OTIC solution Place 5 drops into both ears 2 (two) times daily as needed. 15 mL 0  . diclofenac sodium (VOLTAREN) 1 % GEL Apply 2 g topically 4 (four) times daily as needed. 100 g 5  . hydrochlorothiazide (MICROZIDE) 12.5 MG capsule Take 1 capsule (12.5 mg total) by mouth daily. Annual appt due in April must see provider for future refills 90 capsule 0  . meclizine (ANTIVERT) 25 MG tablet Take 25 mg by mouth 3 (three) times daily as needed for dizziness.     No current facility-administered medications on file prior to visit.     Observations/Objective: Alert, NAD, appropriate mood and affect, resps normal, cn 2-12 intact, moves all 4s, no visible rash or swelling Lab Results  Component Value Date   WBC 8.0 10/30/2017   HGB 15.0 10/30/2017   HCT 44.0 10/30/2017   PLT 248.0 10/30/2017   GLUCOSE 101 (H) 10/30/2017   CHOL 168 10/30/2017   TRIG 115.0 10/30/2017   HDL 64.60 10/30/2017   LDLDIRECT 122.2 02/09/2010   LDLCALC 81 10/30/2017   ALT 13 10/30/2017   AST 15 10/30/2017   NA 134 (L) 10/30/2017   K 3.9 10/30/2017   CL 97 10/30/2017   CREATININE 0.72 10/30/2017   BUN 14 10/30/2017   CO2 27 10/30/2017   TSH 1.55 10/30/2017   INR 0.97 05/22/2013   Assessment and Plan: See notes  Follow Up Instructions: See notes   I discussed the assessment and treatment plan with the patient. The patient was provided an opportunity to ask questions and all were answered. The  patient agreed with the plan and demonstrated an understanding of the instructions.   The patient was advised to call back or seek an in-person evaluation if the symptoms worsen or if the condition fails to improve as anticipated.  Cathlean Cower, MD

## 2018-12-26 NOTE — Assessment & Plan Note (Signed)
stable overall by history and exam, recent data reviewed with pt, and pt to continue medical treatment as before,  to f/u any worsening symptoms or concerns, for lipids with labs 

## 2018-12-28 ENCOUNTER — Other Ambulatory Visit (INDEPENDENT_AMBULATORY_CARE_PROVIDER_SITE_OTHER): Payer: Medicare Other

## 2018-12-28 DIAGNOSIS — E785 Hyperlipidemia, unspecified: Secondary | ICD-10-CM

## 2018-12-28 DIAGNOSIS — E611 Iron deficiency: Secondary | ICD-10-CM

## 2018-12-28 DIAGNOSIS — E559 Vitamin D deficiency, unspecified: Secondary | ICD-10-CM | POA: Diagnosis not present

## 2018-12-28 DIAGNOSIS — E538 Deficiency of other specified B group vitamins: Secondary | ICD-10-CM | POA: Diagnosis not present

## 2018-12-28 LAB — CBC WITH DIFFERENTIAL/PLATELET
Basophils Absolute: 0 10*3/uL (ref 0.0–0.1)
Basophils Relative: 0.6 % (ref 0.0–3.0)
Eosinophils Absolute: 0 10*3/uL (ref 0.0–0.7)
Eosinophils Relative: 0.7 % (ref 0.0–5.0)
HCT: 42.8 % (ref 36.0–46.0)
Hemoglobin: 14.9 g/dL (ref 12.0–15.0)
Lymphocytes Relative: 30.7 % (ref 12.0–46.0)
Lymphs Abs: 1.8 10*3/uL (ref 0.7–4.0)
MCHC: 34.8 g/dL (ref 30.0–36.0)
MCV: 94.4 fl (ref 78.0–100.0)
Monocytes Absolute: 0.5 10*3/uL (ref 0.1–1.0)
Monocytes Relative: 8.9 % (ref 3.0–12.0)
Neutro Abs: 3.4 10*3/uL (ref 1.4–7.7)
Neutrophils Relative %: 59.1 % (ref 43.0–77.0)
Platelets: 251 10*3/uL (ref 150.0–400.0)
RBC: 4.53 Mil/uL (ref 3.87–5.11)
RDW: 12.9 % (ref 11.5–15.5)
WBC: 5.8 10*3/uL (ref 4.0–10.5)

## 2018-12-28 LAB — BASIC METABOLIC PANEL
BUN: 14 mg/dL (ref 6–23)
CO2: 28 mEq/L (ref 19–32)
Calcium: 9.3 mg/dL (ref 8.4–10.5)
Chloride: 98 mEq/L (ref 96–112)
Creatinine, Ser: 0.85 mg/dL (ref 0.40–1.20)
GFR: 64.56 mL/min (ref >60.00)
Glucose, Bld: 100 mg/dL — ABNORMAL HIGH (ref 70–99)
Potassium: 3.9 mEq/L (ref 3.5–5.1)
Sodium: 135 mEq/L (ref 135–145)

## 2018-12-28 LAB — VITAMIN D 25 HYDROXY (VIT D DEFICIENCY, FRACTURES): VITD: 49.23 ng/mL (ref 30.00–100.00)

## 2018-12-28 LAB — HEPATIC FUNCTION PANEL
ALT: 12 U/L (ref 0–35)
AST: 14 U/L (ref 0–37)
Albumin: 4.4 g/dL (ref 3.5–5.2)
Alkaline Phosphatase: 43 U/L (ref 39–117)
Bilirubin, Direct: 0.1 mg/dL (ref 0.0–0.3)
Total Bilirubin: 0.7 mg/dL (ref 0.2–1.2)
Total Protein: 6.8 g/dL (ref 6.0–8.3)

## 2018-12-28 LAB — LIPID PANEL
Cholesterol: 169 mg/dL (ref 0–200)
HDL: 68.2 mg/dL (ref >39.00)
LDL Cholesterol: 82 mg/dL (ref 0–99)
NonHDL: 101.26
Total CHOL/HDL Ratio: 2
Triglycerides: 94 mg/dL (ref 0.0–149.0)
VLDL: 18.8 mg/dL (ref 0.0–40.0)

## 2018-12-28 LAB — URINALYSIS, ROUTINE W REFLEX MICROSCOPIC
Bilirubin Urine: NEGATIVE
Ketones, ur: NEGATIVE
Nitrite: NEGATIVE
Specific Gravity, Urine: 1.01 (ref 1.000–1.030)
Total Protein, Urine: NEGATIVE
Urine Glucose: NEGATIVE
Urobilinogen, UA: 0.2 (ref 0.0–1.0)
pH: 7 (ref 5.0–8.0)

## 2018-12-28 LAB — TSH: TSH: 2.12 u[IU]/mL (ref 0.35–4.50)

## 2018-12-28 LAB — IBC PANEL
Iron: 158 ug/dL — ABNORMAL HIGH (ref 42–145)
Saturation Ratios: 42.6 % (ref 20.0–50.0)
Transferrin: 265 mg/dL (ref 212.0–360.0)

## 2018-12-28 LAB — VITAMIN B12: Vitamin B-12: 287 pg/mL (ref 211–911)

## 2019-01-18 ENCOUNTER — Other Ambulatory Visit: Payer: Self-pay | Admitting: Internal Medicine

## 2019-04-15 DIAGNOSIS — M1711 Unilateral primary osteoarthritis, right knee: Secondary | ICD-10-CM | POA: Diagnosis not present

## 2019-04-15 DIAGNOSIS — I10 Essential (primary) hypertension: Secondary | ICD-10-CM | POA: Diagnosis not present

## 2019-04-18 DIAGNOSIS — M533 Sacrococcygeal disorders, not elsewhere classified: Secondary | ICD-10-CM | POA: Diagnosis not present

## 2019-05-02 DIAGNOSIS — M1711 Unilateral primary osteoarthritis, right knee: Secondary | ICD-10-CM | POA: Diagnosis not present

## 2019-05-07 DIAGNOSIS — M79672 Pain in left foot: Secondary | ICD-10-CM | POA: Diagnosis not present

## 2019-05-07 DIAGNOSIS — G5762 Lesion of plantar nerve, left lower limb: Secondary | ICD-10-CM | POA: Diagnosis not present

## 2019-05-07 DIAGNOSIS — G5782 Other specified mononeuropathies of left lower limb: Secondary | ICD-10-CM | POA: Diagnosis not present

## 2019-05-07 DIAGNOSIS — M7742 Metatarsalgia, left foot: Secondary | ICD-10-CM | POA: Diagnosis not present

## 2019-05-20 DIAGNOSIS — Z23 Encounter for immunization: Secondary | ICD-10-CM | POA: Diagnosis not present

## 2019-05-21 DIAGNOSIS — M810 Age-related osteoporosis without current pathological fracture: Secondary | ICD-10-CM | POA: Diagnosis not present

## 2019-05-21 DIAGNOSIS — Z87891 Personal history of nicotine dependence: Secondary | ICD-10-CM | POA: Diagnosis not present

## 2019-05-21 DIAGNOSIS — Z79899 Other long term (current) drug therapy: Secondary | ICD-10-CM | POA: Diagnosis not present

## 2019-05-21 DIAGNOSIS — E559 Vitamin D deficiency, unspecified: Secondary | ICD-10-CM | POA: Diagnosis not present

## 2019-06-05 DIAGNOSIS — Z85828 Personal history of other malignant neoplasm of skin: Secondary | ICD-10-CM | POA: Diagnosis not present

## 2019-06-05 DIAGNOSIS — Z08 Encounter for follow-up examination after completed treatment for malignant neoplasm: Secondary | ICD-10-CM | POA: Diagnosis not present

## 2019-06-05 DIAGNOSIS — D1801 Hemangioma of skin and subcutaneous tissue: Secondary | ICD-10-CM | POA: Diagnosis not present

## 2019-06-05 DIAGNOSIS — L821 Other seborrheic keratosis: Secondary | ICD-10-CM | POA: Diagnosis not present

## 2019-06-07 DIAGNOSIS — Z87891 Personal history of nicotine dependence: Secondary | ICD-10-CM | POA: Diagnosis not present

## 2019-06-07 DIAGNOSIS — E559 Vitamin D deficiency, unspecified: Secondary | ICD-10-CM | POA: Diagnosis not present

## 2019-06-07 DIAGNOSIS — M8588 Other specified disorders of bone density and structure, other site: Secondary | ICD-10-CM | POA: Diagnosis not present

## 2019-06-07 DIAGNOSIS — M85852 Other specified disorders of bone density and structure, left thigh: Secondary | ICD-10-CM | POA: Diagnosis not present

## 2019-06-07 DIAGNOSIS — M810 Age-related osteoporosis without current pathological fracture: Secondary | ICD-10-CM | POA: Diagnosis not present

## 2019-06-11 DIAGNOSIS — E559 Vitamin D deficiency, unspecified: Secondary | ICD-10-CM | POA: Diagnosis not present

## 2019-06-24 DIAGNOSIS — M810 Age-related osteoporosis without current pathological fracture: Secondary | ICD-10-CM | POA: Diagnosis not present

## 2019-06-24 DIAGNOSIS — Z01419 Encounter for gynecological examination (general) (routine) without abnormal findings: Secondary | ICD-10-CM | POA: Diagnosis not present

## 2019-06-28 DIAGNOSIS — Z1231 Encounter for screening mammogram for malignant neoplasm of breast: Secondary | ICD-10-CM | POA: Diagnosis not present

## 2019-08-13 DIAGNOSIS — M84375D Stress fracture, left foot, subsequent encounter for fracture with routine healing: Secondary | ICD-10-CM | POA: Diagnosis not present

## 2019-08-29 DIAGNOSIS — M84375D Stress fracture, left foot, subsequent encounter for fracture with routine healing: Secondary | ICD-10-CM | POA: Diagnosis not present

## 2019-09-26 DIAGNOSIS — M1711 Unilateral primary osteoarthritis, right knee: Secondary | ICD-10-CM | POA: Diagnosis not present

## 2019-09-26 DIAGNOSIS — I10 Essential (primary) hypertension: Secondary | ICD-10-CM | POA: Diagnosis not present

## 2019-10-03 DIAGNOSIS — M1711 Unilateral primary osteoarthritis, right knee: Secondary | ICD-10-CM | POA: Diagnosis not present

## 2019-10-18 DIAGNOSIS — M545 Low back pain: Secondary | ICD-10-CM | POA: Diagnosis not present

## 2019-10-18 DIAGNOSIS — N12 Tubulo-interstitial nephritis, not specified as acute or chronic: Secondary | ICD-10-CM | POA: Diagnosis not present

## 2019-10-18 DIAGNOSIS — I10 Essential (primary) hypertension: Secondary | ICD-10-CM | POA: Diagnosis not present

## 2019-10-18 DIAGNOSIS — M62838 Other muscle spasm: Secondary | ICD-10-CM | POA: Diagnosis not present

## 2019-10-18 DIAGNOSIS — Z87891 Personal history of nicotine dependence: Secondary | ICD-10-CM | POA: Diagnosis not present

## 2019-10-28 DIAGNOSIS — L821 Other seborrheic keratosis: Secondary | ICD-10-CM | POA: Diagnosis not present

## 2019-10-28 DIAGNOSIS — L820 Inflamed seborrheic keratosis: Secondary | ICD-10-CM | POA: Diagnosis not present

## 2019-10-28 DIAGNOSIS — D1801 Hemangioma of skin and subcutaneous tissue: Secondary | ICD-10-CM | POA: Diagnosis not present

## 2019-10-31 DIAGNOSIS — M25774 Osteophyte, right foot: Secondary | ICD-10-CM | POA: Diagnosis not present

## 2019-10-31 DIAGNOSIS — L600 Ingrowing nail: Secondary | ICD-10-CM | POA: Diagnosis not present

## 2019-11-06 ENCOUNTER — Ambulatory Visit: Payer: Medicare Other | Admitting: Internal Medicine

## 2019-11-14 ENCOUNTER — Other Ambulatory Visit: Payer: Self-pay

## 2019-11-14 ENCOUNTER — Ambulatory Visit: Payer: Medicare Other | Admitting: Internal Medicine

## 2019-11-14 ENCOUNTER — Ambulatory Visit (INDEPENDENT_AMBULATORY_CARE_PROVIDER_SITE_OTHER): Payer: Medicare Other | Admitting: Internal Medicine

## 2019-11-14 ENCOUNTER — Encounter: Payer: Self-pay | Admitting: Internal Medicine

## 2019-11-14 VITALS — BP 160/100 | HR 80 | Temp 98.5°F | Ht 65.0 in | Wt 138.0 lb

## 2019-11-14 DIAGNOSIS — I10 Essential (primary) hypertension: Secondary | ICD-10-CM

## 2019-11-14 DIAGNOSIS — J309 Allergic rhinitis, unspecified: Secondary | ICD-10-CM | POA: Diagnosis not present

## 2019-11-14 DIAGNOSIS — E538 Deficiency of other specified B group vitamins: Secondary | ICD-10-CM | POA: Diagnosis not present

## 2019-11-14 DIAGNOSIS — K219 Gastro-esophageal reflux disease without esophagitis: Secondary | ICD-10-CM

## 2019-11-14 DIAGNOSIS — E559 Vitamin D deficiency, unspecified: Secondary | ICD-10-CM

## 2019-11-14 DIAGNOSIS — R739 Hyperglycemia, unspecified: Secondary | ICD-10-CM

## 2019-11-14 DIAGNOSIS — E785 Hyperlipidemia, unspecified: Secondary | ICD-10-CM

## 2019-11-14 LAB — HEPATIC FUNCTION PANEL
ALT: 17 U/L (ref 0–35)
AST: 22 U/L (ref 0–37)
Albumin: 4.6 g/dL (ref 3.5–5.2)
Alkaline Phosphatase: 46 U/L (ref 39–117)
Bilirubin, Direct: 0.1 mg/dL (ref 0.0–0.3)
Total Bilirubin: 0.4 mg/dL (ref 0.2–1.2)
Total Protein: 7.2 g/dL (ref 6.0–8.3)

## 2019-11-14 LAB — URINALYSIS, ROUTINE W REFLEX MICROSCOPIC
Bilirubin Urine: NEGATIVE
Ketones, ur: NEGATIVE
Leukocytes,Ua: NEGATIVE
Nitrite: NEGATIVE
Specific Gravity, Urine: 1.015 (ref 1.000–1.030)
Total Protein, Urine: NEGATIVE
Urine Glucose: NEGATIVE
Urobilinogen, UA: 0.2 (ref 0.0–1.0)
pH: 7 (ref 5.0–8.0)

## 2019-11-14 LAB — LIPID PANEL
Cholesterol: 161 mg/dL (ref 0–200)
HDL: 73.8 mg/dL (ref >39.00)
LDL Cholesterol: 69 mg/dL (ref 0–99)
NonHDL: 87.25
Total CHOL/HDL Ratio: 2
Triglycerides: 93 mg/dL (ref 0.0–149.0)
VLDL: 18.6 mg/dL (ref 0.0–40.0)

## 2019-11-14 LAB — BASIC METABOLIC PANEL
BUN: 13 mg/dL (ref 6–23)
CO2: 31 mEq/L (ref 19–32)
Calcium: 10.2 mg/dL (ref 8.4–10.5)
Chloride: 96 mEq/L (ref 96–112)
Creatinine, Ser: 0.73 mg/dL (ref 0.40–1.20)
GFR: 76.79 mL/min (ref >60.00)
Glucose, Bld: 96 mg/dL (ref 70–99)
Potassium: 4.1 mEq/L (ref 3.5–5.1)
Sodium: 135 mEq/L (ref 135–145)

## 2019-11-14 LAB — CBC WITH DIFFERENTIAL/PLATELET
Basophils Absolute: 0.1 10*3/uL (ref 0.0–0.1)
Basophils Relative: 0.8 % (ref 0.0–3.0)
Eosinophils Absolute: 0.1 10*3/uL (ref 0.0–0.7)
Eosinophils Relative: 0.6 % (ref 0.0–5.0)
HCT: 45.5 % (ref 36.0–46.0)
Hemoglobin: 15.3 g/dL — ABNORMAL HIGH (ref 12.0–15.0)
Lymphocytes Relative: 21.9 % (ref 12.0–46.0)
Lymphs Abs: 1.8 10*3/uL (ref 0.7–4.0)
MCHC: 33.6 g/dL (ref 30.0–36.0)
MCV: 95.1 fl (ref 78.0–100.0)
Monocytes Absolute: 0.6 10*3/uL (ref 0.1–1.0)
Monocytes Relative: 7.7 % (ref 3.0–12.0)
Neutro Abs: 5.6 10*3/uL (ref 1.4–7.7)
Neutrophils Relative %: 69 % (ref 43.0–77.0)
Platelets: 247 10*3/uL (ref 150.0–400.0)
RBC: 4.79 Mil/uL (ref 3.87–5.11)
RDW: 13.4 % (ref 11.5–15.5)
WBC: 8.1 10*3/uL (ref 4.0–10.5)

## 2019-11-14 LAB — HEMOGLOBIN A1C: Hgb A1c MFr Bld: 5.4 % (ref 4.6–6.5)

## 2019-11-14 LAB — VITAMIN D 25 HYDROXY (VIT D DEFICIENCY, FRACTURES): VITD: 60.76 ng/mL (ref 30.00–100.00)

## 2019-11-14 LAB — TSH: TSH: 1.95 u[IU]/mL (ref 0.35–4.50)

## 2019-11-14 LAB — VITAMIN B12: Vitamin B-12: 384 pg/mL (ref 211–911)

## 2019-11-14 MED ORDER — HYDROCHLOROTHIAZIDE 12.5 MG PO CAPS: ORAL_CAPSULE | ORAL | 3 refills | Status: DC

## 2019-11-14 MED ORDER — SIMVASTATIN 20 MG PO TABS: ORAL_TABLET | ORAL | 3 refills | Status: DC

## 2019-11-14 NOTE — Patient Instructions (Signed)
Please check the BP daily for at least 2 wks, and call for BP > 140/90  Please continue all other medications as before, and refills have been done if requested.  Please have the pharmacy call with any other refills you may need.  Please continue your efforts at being more active, low cholesterol diet, and weight control.  You are otherwise up to date with prevention measures today.  Please keep your appointments with your specialists as you may have planned  Please go to the LAB at the blood drawing area for the tests to be done  You will be contacted by phone if any changes need to be made immediately.  Otherwise, you will receive a letter about your results with an explanation, but please check with MyChart first.  Please remember to sign up for MyChart if you have not done so, as this will be important to you in the future with finding out test results, communicating by private email, and scheduling acute appointments online when needed.  Please make an Appointment to return in 6 months, or sooner if needed

## 2019-11-14 NOTE — Progress Notes (Signed)
Subjective:    Patient ID: Crystal Herrera, female    DOB: June 20, 1941, 79 y.o.   MRN: [id number]  HPI:  Here for yearly f/u;  Overall doing ok;  Pt denies Chest pain, worsening SOB, DOE, wheezing, orthopnea, PND, worsening LE edema, palpitations, dizziness or syncope.  Pt denies neurological change such as new headache, facial or extremity weakness.  Pt denies polydipsia, polyuria, or low sugar symptoms. Pt states overall good compliance with treatment and medications, good tolerability, and has been trying to follow appropriate diet.  Pt denies worsening depressive symptoms, suicidal ideation or panic. No fever, night sweats, wt loss, loss of appetite, or other constitutional symptoms.  Pt states good ability with ADL's, has low fall risk, home safety reviewed and adequate, no other significant changes in hearing or vision, and only occasionally active with exercise. BP at home < 140/90 per pt  Started vit d x 6 mo. Had an ED visit for left upper back muscle spasms, but had UTI in [facility] recently about 09/2019, usually spends 4 mo per yr at [facility].  Denies urinary symptoms such as dysuria, frequency, urgency, flank pain, hematuria or n/v, fever, chills. Had left foot stress fracture recnelty now improved BP Readings from Last 3 Encounters:  11/14/19 (!) 160/100  03/30/18 132/81  02/21/18 126/74   Past Medical History:  Diagnosis Date  . Allergic rhinitis, cause unspecified 01/29/2013  . Anemia 1948?  Marland Kitchen BACK PAIN, CHRONIC 02/09/2010  . Cancer ([place])    SKIN  . Cataract   . COLONIC POLYPS, HX OF 02/09/2010  . COMMON MIGRAINE 02/09/2010   neuritis in R side of neck & head  . DEGENERATIVE JOINT DISEASE 02/09/2010   hands , lumbar stenosis   . DEPRESSION 02/09/2010   pt. takes Cymbalta for pain issues   . GERD 02/09/2010  . HYPERLIPIDEMIA 02/09/2010  . HYPERTENSION 02/09/2010  . Lumbar disc disease   . Neuromuscular disorder ([place])    neuritis in head & neck  . OCCIPITAL NEURALGIA 06/09/2010    . OSTEOPENIA 02/09/2010  . Pneumonia    "as an infant" (05/15/2012)  . Varicose vein of leg    BLE   Past Surgical History:  Procedure Laterality Date  . BREAST BIOPSY  1988   left; Neg  . CHOLECYSTECTOMY  ? 2009  . chymopapain injection  1986   Lumbar  . COLONOSCOPY    . DILATION AND CURETTAGE OF UTERUS  1960's  . HERNIA REPAIR  05/15/2012   ventral incisional   . LUMBAR FUSION  05/22/2013   L2  L3 decompression /fusion      Dr Lynann Bologna  . LUMBAR LAMINECTOMY  1986  . OVARIAN CYST REMOVAL  2006  . Stimulator placed   07/2014   placed 2 years ago for pain  . TRANSFORAMINAL LUMBAR INTERBODY FUSION (TLIF) WITH PEDICLE SCREW FIXATION 1 LEVEL Left 05/22/2013   Procedure: TRANSFORAMINAL LUMBAR INTERBODY FUSION (TLIF) WITH PEDICLE SCREW FIXATION 1 LEVEL;  Surgeon: Sinclair Ship, MD;  Location: [place];  Service: Orthopedics;  Laterality: Left;  Left sided lumbar 2-3 Transforaminal lumbar interbody fusion with instrumentation, allograft  . TUBAL LIGATION  1970's  . VARICOSE VEIN SURGERY  ~ 2008   bilaterally  . VENTRAL HERNIA REPAIR  05/15/2012   Procedure: LAPAROSCOPIC VENTRAL HERNIA;  Surgeon: Gayland Curry, MD,FACS;  Location: [place];  Service: General;  Laterality: N/A;  laparoscopic ventral incisional hernia repair with mesh    reports that she quit smoking about  48 years ago. Her smoking use included cigarettes. She has a 10.00 pack-year smoking history. She has never used smokeless tobacco. She reports that she does not drink alcohol or use drugs. family history includes Breast cancer in her maternal grandmother; COPD in her mother; Cancer in her maternal grandmother, mother, and son; Colon cancer in her maternal aunt; Diabetes in her father; Lung cancer in her mother; Varicose Veins in her mother. Allergies  Allergen Reactions  . Cefuroxime     Might cause hives  . Cephalosporins     Might cause hives   . Latex Nausea Only  . Celexa [Citalopram Hydrobromide] Nausea Only   . Dexlansoprazole Anxiety    Sleeplessness heart racing headaches chiulls  . Lexapro [Escitalopram Oxalate] Nausea Only  . Penicillins Hives  . Sulfonamide Derivatives Hives  . Vancomycin Rash   Current Outpatient Medications on File Prior to Visit  Medication Sig Dispense Refill  . Biotin 1000 MCG tablet Take by mouth.    . calcium carbonate (OSCAL) 1500 (600 Ca) MG TABS tablet Take by mouth.    . calcium gluconate 500 MG tablet Take 500 mg by mouth 2 (two) times daily.      . carbamide peroxide (DEBROX) 6.5 % OTIC solution Place 5 drops into both ears 2 (two) times daily as needed. 15 mL 0  . Cholecalciferol (VITAMIN D3 PO) Take by mouth.    . meclizine (ANTIVERT) 25 MG tablet Take 25 mg by mouth 3 (three) times daily as needed for dizziness.    . diclofenac sodium (VOLTAREN) 1 % GEL Apply 2 g topically 4 (four) times daily as needed. (Patient not taking: Reported on 11/14/2019) 100 g 5  . zoledronic acid (RECLAST) 5 MG/100ML SOLN injection Inject into the vein.     No current facility-administered medications on file prior to visit.   Review of Systems All otherwise neg per pt     Objective:   Physical Exam BP (!) 160/100 (BP Location: Left Arm, Patient Position: Sitting, Cuff Size: Small)   Pulse 80   Temp 98.5 F (36.9 C) (Oral)   Ht 5\' 5"  (1.651 m)   Wt 138 lb (62.6 kg)   SpO2 98%   BMI 22.96 kg/m  VS noted,  Constitutional: Pt appears in NAD HENT: Head: NCAT.  Right Ear: External ear normal.  Left Ear: External ear normal.  Eyes: . Pupils are equal, round, and reactive to light. Conjunctivae and EOM are normal Nose: without d/c or deformity Neck: Neck supple. Gross normal ROM Cardiovascular: Normal rate and regular rhythm.   Pulmonary/Chest: Effort normal and breath sounds without rales or wheezing.  Abd:  Soft, NT, ND, + BS, no organomegaly Neurological: Pt is alert. At baseline orientation, motor grossly intact Skin: Skin is warm. No rashes, other new lesions,  no LE edema Psychiatric: Pt behavior is normal without agitation  All otherwise neg per pt Lab Results  Component Value Date   WBC 8.1 11/14/2019   HGB 15.3 (H) 11/14/2019   HCT 45.5 11/14/2019   PLT 247.0 11/14/2019   GLUCOSE 96 11/14/2019   CHOL 161 11/14/2019   TRIG 93.0 11/14/2019   HDL 73.80 11/14/2019   LDLDIRECT 122.2 02/09/2010   LDLCALC 69 11/14/2019   ALT 17 11/14/2019   AST 22 11/14/2019   NA 135 11/14/2019   K 4.1 11/14/2019   CL 96 11/14/2019   CREATININE 0.73 11/14/2019   BUN 13 11/14/2019   CO2 31 11/14/2019   TSH 1.95 11/14/2019  INR 0.97 05/22/2013   HGBA1C 5.4 11/14/2019      Assessment & Plan:

## 2019-11-16 ENCOUNTER — Encounter: Payer: Self-pay | Admitting: Internal Medicine

## 2019-11-16 NOTE — Assessment & Plan Note (Signed)
Continue oral replacement.  

## 2019-11-16 NOTE — Assessment & Plan Note (Signed)
stable overall by history and exam, recent data reviewed with pt, and pt to continue medical treatment as before,  to f/u any worsening symptoms or concerns  

## 2019-12-04 ENCOUNTER — Other Ambulatory Visit: Payer: Self-pay

## 2019-12-04 ENCOUNTER — Ambulatory Visit (INDEPENDENT_AMBULATORY_CARE_PROVIDER_SITE_OTHER): Payer: Medicare Other | Admitting: Internal Medicine

## 2019-12-04 ENCOUNTER — Encounter: Payer: Self-pay | Admitting: Internal Medicine

## 2019-12-04 DIAGNOSIS — G5762 Lesion of plantar nerve, left lower limb: Secondary | ICD-10-CM | POA: Diagnosis not present

## 2019-12-04 DIAGNOSIS — R29818 Other symptoms and signs involving the nervous system: Secondary | ICD-10-CM | POA: Diagnosis not present

## 2019-12-04 DIAGNOSIS — R413 Other amnesia: Secondary | ICD-10-CM

## 2019-12-04 DIAGNOSIS — I10 Essential (primary) hypertension: Secondary | ICD-10-CM

## 2019-12-04 DIAGNOSIS — M25511 Pain in right shoulder: Secondary | ICD-10-CM

## 2019-12-04 MED ORDER — AMLODIPINE BESYLATE 2.5 MG PO TABS: 2.5000 mg | ORAL_TABLET | Freq: Every day | ORAL | 3 refills | Status: DC

## 2019-12-04 NOTE — Assessment & Plan Note (Signed)
Mild, exam benign, for tylenol prn, declines sport med referral

## 2019-12-04 NOTE — Assessment & Plan Note (Signed)
Mild uncontrolled, for amlodipine 2.5 qd

## 2019-12-04 NOTE — Progress Notes (Signed)
Subjective:    Patient ID: Crystal Herrera, female    DOB: May 02, 1941, 79 y.o.   MRN: [id number]  HPI  .here with husband who helps with hx; Here to f/u; overall doing ok,  Pt denies chest pain, increasing sob or doe, wheezing, orthopnea, PND, increased LE swelling, palpitations, dizziness or syncope.  Pt denies new neurological symptoms such as new headache, or facial or extremity weakness or numbness.  Pt denies polydipsia, polyuria, or low sugar episode.  Pt states overall good compliance with meds, mostly trying to follow appropriate diet, with wt overall stable, also has intermittent right shoudler pain sharp worse to abduct mild but has FROM.  Also husband confirms pt having increased ST Memory loss in last 3-6 mo more than forgetfulness; she drove here but requires instructions on where to go, which he provides as he personally is unable to drive today due to right leg in soft leg cast.   Past Medical History:  Diagnosis Date  . Allergic rhinitis, cause unspecified 01/29/2013  . Anemia 1948?  Marland Kitchen BACK PAIN, CHRONIC 02/09/2010  . Cancer ([place])    SKIN  . Cataract   . COLONIC POLYPS, HX OF 02/09/2010  . COMMON MIGRAINE 02/09/2010   neuritis in R side of neck & head  . DEGENERATIVE JOINT DISEASE 02/09/2010   hands , lumbar stenosis   . DEPRESSION 02/09/2010   pt. takes Cymbalta for pain issues   . GERD 02/09/2010  . HYPERLIPIDEMIA 02/09/2010  . HYPERTENSION 02/09/2010  . Lumbar disc disease   . Neuromuscular disorder ([place])    neuritis in head & neck  . OCCIPITAL NEURALGIA 06/09/2010  . OSTEOPENIA 02/09/2010  . Pneumonia    "as an infant" (05/15/2012)  . Varicose vein of leg    BLE   Past Surgical History:  Procedure Laterality Date  . BREAST BIOPSY  1988   left; Neg  . CHOLECYSTECTOMY  ? 2009  . chymopapain injection  1986   Lumbar  . COLONOSCOPY    . DILATION AND CURETTAGE OF UTERUS  1960's  . HERNIA REPAIR  05/15/2012   ventral incisional   . LUMBAR FUSION  05/22/2013   L2   L3 decompression /fusion      Dr Lynann Bologna  . LUMBAR LAMINECTOMY  1986  . OVARIAN CYST REMOVAL  2006  . Stimulator placed   07/2014   placed 2 years ago for pain  . TRANSFORAMINAL LUMBAR INTERBODY FUSION (TLIF) WITH PEDICLE SCREW FIXATION 1 LEVEL Left 05/22/2013   Procedure: TRANSFORAMINAL LUMBAR INTERBODY FUSION (TLIF) WITH PEDICLE SCREW FIXATION 1 LEVEL;  Surgeon: Sinclair Ship, MD;  Location: [place];  Service: Orthopedics;  Laterality: Left;  Left sided lumbar 2-3 Transforaminal lumbar interbody fusion with instrumentation, allograft  . TUBAL LIGATION  1970's  . VARICOSE VEIN SURGERY  ~ 2008   bilaterally  . VENTRAL HERNIA REPAIR  05/15/2012   Procedure: LAPAROSCOPIC VENTRAL HERNIA;  Surgeon: Gayland Curry, MD,FACS;  Location: [place];  Service: General;  Laterality: N/A;  laparoscopic ventral incisional hernia repair with mesh    reports that she quit smoking about 48 years ago. Her smoking use included cigarettes. She has a 10.00 pack-year smoking history. She has never used smokeless tobacco. She reports that she does not drink alcohol or use drugs. family history includes Breast cancer in her maternal grandmother; COPD in her mother; Cancer in her maternal grandmother, mother, and son; Colon cancer in her maternal aunt; Diabetes in her father; Lung cancer in  her mother; Varicose Veins in her mother. Allergies  Allergen Reactions  . Cefuroxime     Might cause hives  . Cephalosporins     Might cause hives   . Latex Nausea Only  . Celexa [Citalopram Hydrobromide] Nausea Only  . Dexlansoprazole Anxiety    Sleeplessness heart racing headaches chiulls  . Lexapro [Escitalopram Oxalate] Nausea Only  . Penicillins Hives  . Sulfonamide Derivatives Hives  . Vancomycin Rash   Current Outpatient Medications on File Prior to Visit  Medication Sig Dispense Refill  . Biotin 1000 MCG tablet Take by mouth.    . calcium carbonate (OSCAL) 1500 (600 Ca) MG TABS tablet Take by mouth.    .  calcium gluconate 500 MG tablet Take 500 mg by mouth 2 (two) times daily.      . carbamide peroxide (DEBROX) 6.5 % OTIC solution Place 5 drops into both ears 2 (two) times daily as needed. 15 mL 0  . Cholecalciferol (VITAMIN D3 PO) Take by mouth.    . diclofenac sodium (VOLTAREN) 1 % GEL Apply 2 g topically 4 (four) times daily as needed. 100 g 5  . hydrochlorothiazide (MICROZIDE) 12.5 MG capsule Take 1 capsule by mouth daily 90 capsule 3  . meclizine (ANTIVERT) 25 MG tablet Take 25 mg by mouth 3 (three) times daily as needed for dizziness.    . simvastatin (ZOCOR) 20 MG tablet Take 1 tablet by mouth every day at 6pm 90 tablet 3  . zoledronic acid (RECLAST) 5 MG/100ML SOLN injection Inject into the vein.     No current facility-administered medications on file prior to visit.   Ros: All otherwise neg per pt    Objective:   Physical Exam BP 140/80 (BP Location: Left Arm, Patient Position: Sitting, Cuff Size: Large)   Pulse 82   Temp 98.1 F (36.7 C) (Oral)   Ht 5\' 5"  (1.651 m)   Wt 137 lb 6.4 oz (62.3 kg)   SpO2 97%   BMI 22.86 kg/m  VS noted,  Constitutional: Pt appears in NAD HENT: Head: NCAT.  Right Ear: External ear normal.  Left Ear: External ear normal.  Eyes: . Pupils are equal, round, and reactive to light. Conjunctivae and EOM are normal Nose: without d/c or deformity Neck: Neck supple. Gross normal ROM Cardiovascular: Normal rate and regular rhythm.   Pulmonary/Chest: Effort normal and breath sounds without rales or wheezing.  Abd:  Soft, NT, ND, + BS, no organomegaly Neurological: Pt is alert. At baseline orientation, motor grossly intact Skin: Skin is warm. No rashes, other new lesions, no LE edema Psychiatric: Pt behavior is normal without agitation  All otherwise neg per pt Lab Results  Component Value Date   WBC 8.1 11/14/2019   HGB 15.3 (H) 11/14/2019   HCT 45.5 11/14/2019   PLT 247.0 11/14/2019   GLUCOSE 96 11/14/2019   CHOL 161 11/14/2019   TRIG 93.0  11/14/2019   HDL 73.80 11/14/2019   LDLDIRECT 122.2 02/09/2010   LDLCALC 69 11/14/2019   ALT 17 11/14/2019   AST 22 11/14/2019   NA 135 11/14/2019   K 4.1 11/14/2019   CL 96 11/14/2019   CREATININE 0.73 11/14/2019   BUN 13 11/14/2019   CO2 31 11/14/2019   TSH 1.95 11/14/2019   INR 0.97 05/22/2013   HGBA1C 5.4 11/14/2019      Assessment & Plan:

## 2019-12-04 NOTE — Assessment & Plan Note (Addendum)
Likely mild cognitive impairment, for MRI brain, and refer neurology per request  I spent 31 minutes in preparing to see the patient by review of recent labs, imaging and procedures, obtaining and reviewing separately obtained history, communicating with the patient and family or caregiver, ordering medications, tests or procedures, and documenting clinical information in the EHR including the differential Dx, treatment, and any further evaluation and other management of memory changes, htn, right shoulder pain

## 2019-12-04 NOTE — Patient Instructions (Addendum)
Please take all new medication as prescribed - the amlodipine 2.5 mg per day  Please see Sports Medicine on the first floor if you have worsening right shoulder pain  You will be contacted regarding the referral for: MRI brain and Neurology  Please continue all other medications as before, and refills have been done if requested.  Please have the pharmacy call with any other refills you may need.  Please continue your efforts at being more active, low cholesterol diet, and weight control.  Please keep your appointments with your specialists as you may have planned  Please make an Appointment to return in 3 months, or sooner if needed

## 2019-12-11 DIAGNOSIS — G5762 Lesion of plantar nerve, left lower limb: Secondary | ICD-10-CM | POA: Diagnosis not present

## 2019-12-12 ENCOUNTER — Telehealth: Payer: Self-pay | Admitting: Internal Medicine

## 2019-12-12 NOTE — Telephone Encounter (Signed)
Referral was sent to [facility] and due to the device in her leg she was told "  WE ARE UNABLE TO DO THIS MRI AT OUR LOCATION-PATIENT MAY NEED TO HAVE IT DONE AT THE HOSPITAL/BG". Requesting MRI to be set-up at the hosp.Marland KitchenJohny Chess

## 2019-12-12 NOTE — Telephone Encounter (Signed)
New Message:   Pt is calling and states that her referral for a MRI needs to be sent to [facility] due to the other facility being unable to do it due to a device in her hip. Please advise.

## 2019-12-18 DIAGNOSIS — G5762 Lesion of plantar nerve, left lower limb: Secondary | ICD-10-CM | POA: Diagnosis not present

## 2019-12-18 DIAGNOSIS — M7752 Other enthesopathy of left foot: Secondary | ICD-10-CM | POA: Diagnosis not present

## 2020-01-06 ENCOUNTER — Other Ambulatory Visit: Payer: Self-pay

## 2020-01-06 ENCOUNTER — Ambulatory Visit (HOSPITAL_COMMUNITY)
Admission: RE | Admit: 2020-01-06 | Discharge: 2020-01-06 | Disposition: A | Discharge status: Home / Self Care | Payer: Medicare Other | Source: Ambulatory Visit | Attending: Internal Medicine | Admitting: Internal Medicine

## 2020-01-06 ENCOUNTER — Encounter (HOSPITAL_COMMUNITY): Payer: Self-pay

## 2020-01-06 DIAGNOSIS — R29818 Other symptoms and signs involving the nervous system: Secondary | ICD-10-CM

## 2020-01-06 NOTE — Progress Notes (Signed)
Was able to speak a rep from [facility], who sent lead information. Pt has two percutaneous leads Model #: 1058-50B  Prior to MRI pt should call rep to schedule an impedence check. They will make sure device is turned off, they do not have to stay during the study and pt will decide if to keep device on or off.  To call for the impedence check she can call [phone] or tech support at [phone] and they will send a page to someone. Tried calling pt to provide her with information but voicemail was full.  Pt is whole body eligible. Paperwork sent and scanned into chart.

## 2020-01-09 ENCOUNTER — Telehealth: Payer: Self-pay | Admitting: Internal Medicine

## 2020-01-09 NOTE — Telephone Encounter (Signed)
[name] with [facility] called and asked about an MRI order. The patient is scheduled to have one this weekend. The fax number is [phone]

## 2020-01-10 NOTE — Telephone Encounter (Signed)
Done hardcopy 

## 2020-01-10 NOTE — Telephone Encounter (Signed)
Sent to Dr. John. 

## 2020-01-10 NOTE — Telephone Encounter (Signed)
Fax has been sent to [facility] as URGENT!!

## 2020-01-13 DIAGNOSIS — G319 Degenerative disease of nervous system, unspecified: Secondary | ICD-10-CM | POA: Diagnosis not present

## 2020-01-16 ENCOUNTER — Encounter: Payer: Self-pay | Admitting: *Deleted

## 2020-01-20 ENCOUNTER — Encounter: Payer: Self-pay | Admitting: Neurology

## 2020-01-20 ENCOUNTER — Other Ambulatory Visit: Payer: Self-pay

## 2020-01-20 ENCOUNTER — Ambulatory Visit (INDEPENDENT_AMBULATORY_CARE_PROVIDER_SITE_OTHER): Payer: Medicare Other | Admitting: Neurology

## 2020-01-20 VITALS — BP 128/83 | HR 75 | Ht 65.0 in | Wt 133.5 lb

## 2020-01-20 DIAGNOSIS — R42 Dizziness and giddiness: Secondary | ICD-10-CM | POA: Diagnosis not present

## 2020-01-20 DIAGNOSIS — G3184 Mild cognitive impairment, so stated: Secondary | ICD-10-CM | POA: Diagnosis not present

## 2020-01-20 MED ORDER — MEMANTINE HCL 5 MG PO TABS: 5.0000 mg | ORAL_TABLET | Freq: Two times a day (BID) | ORAL | 11 refills | Status: DC

## 2020-01-20 NOTE — Progress Notes (Signed)
HISTORICAL  Crystal Herrera is a 79 year old female, seen in request by her primary care physician Dr. Cathlean Cower for evaluation of memory loss, she is accompanied by her husband at today's clinical visit on January 20, 2020.  I reviewed and summarized the referring note. 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, reading [employer], she denies family history of dementia  Since early 2021, she noticed mild memory loss, word finding difficulties, has difficulty focusing on her yearly task of making yearly Christmas ornament for 8 grandchildren, she also complains of low back pain, has to break from her routine of walking regularly, she spent a lot of time sitting down, reading, denies difficulty keeping up with her reading  She no longer feel confident driving because she has trouble figuring out the direction,  MRI of the brain without contrast in June 2021 from [facility] system, generalized atrophy, no acute abnormality  Laboratory evaluations, October 2020, normal vitamin D, CMP, CBC hemoglobin 15.3, TSH, B12, A1c, 5.4, fasting lipid panel, LDL 69  She had a history of intermittent dizziness in the past, often with improved quickly by repositioning maneuver, and vestibular exercise, this particular episode has been ongoing for 2 weeks, despite multiple attempts of Apley's maneuver, she continue complains of dizziness, it can happen in a sitting down position, especially when she gets up, she also complains of few years history of loud tinnitus, mild decreased hearing, has ENT appointment pending   REVIEW OF SYSTEMS: Full 14 system review of systems performed and notable only for as above All other review of systems were negative.  ALLERGIES: Allergies  Allergen Reactions   . Cefuroxime     Might cause hives  . Cephalosporins     Might cause hives   . Latex Nausea Only  . Celexa [Citalopram Hydrobromide] Nausea Only  . Dexlansoprazole Anxiety    Sleeplessness heart racing headaches chiulls  . Lexapro [Escitalopram Oxalate] Nausea Only  . Penicillins Hives  . Sulfonamide Derivatives Hives  . Vancomycin Rash    HOME MEDICATIONS: Current Outpatient Medications  Medication Sig Dispense Refill  . amLODipine (NORVASC) 2.5 MG tablet Take 1 tablet (2.5 mg total) by mouth daily. 90 tablet 3  . Biotin 1000 MCG tablet Take by mouth.    . calcium carbonate (OSCAL) 1500 (600 Ca) MG TABS tablet Take by mouth.    . Cholecalciferol (VITAMIN D3 PO) Take 2,000 Units by mouth daily.     . hydrochlorothiazide (MICROZIDE) 12.5 MG capsule Take 1 capsule by mouth daily 90 capsule 3  . simvastatin (ZOCOR) 20 MG tablet Take 1 tablet by mouth every day at 6pm 90 tablet 3   No current facility-administered medications for this visit.    PAST MEDICAL HISTORY: Past Medical History:  Diagnosis Date  . Allergic rhinitis, cause unspecified 01/29/2013  . Anemia 1948?  Marland Kitchen BACK PAIN, CHRONIC 02/09/2010  . Cancer ([place])    SKIN  . Cataract   . COLONIC POLYPS, HX OF 02/09/2010  . COMMON MIGRAINE 02/09/2010   neuritis in R side of neck & head  . DEGENERATIVE JOINT DISEASE 02/09/2010   hands , lumbar stenosis   . DEPRESSION 02/09/2010   pt. takes Cymbalta for pain issues   . GERD 02/09/2010  . HYPERLIPIDEMIA 02/09/2010  . HYPERTENSION  02/09/2010  . Lumbar disc disease   . Memory loss   . Neuromuscular disorder ([place])    neuritis in head & neck  . OCCIPITAL NEURALGIA 06/09/2010  . OSTEOPENIA 02/09/2010  . Pneumonia    "as an infant" (05/15/2012)  . Varicose vein of leg    BLE    PAST SURGICAL HISTORY: Past Surgical History:  Procedure Laterality Date  . BREAST BIOPSY  1988   left; Neg  . CHOLECYSTECTOMY  ? 2009  . chymopapain injection  1986   Lumbar  . COLONOSCOPY    .  DILATION AND CURETTAGE OF UTERUS  1960's  . HERNIA REPAIR  05/15/2012   ventral incisional   . LUMBAR FUSION  05/22/2013   L2  L3 decompression /fusion      Dr Lynann Bologna  . LUMBAR LAMINECTOMY  1986  . OVARIAN CYST REMOVAL  2006  . Stimulator placed   07/2014   placed 2 years ago for pain  . TRANSFORAMINAL LUMBAR INTERBODY FUSION (TLIF) WITH PEDICLE SCREW FIXATION 1 LEVEL Left 05/22/2013   Procedure: TRANSFORAMINAL LUMBAR INTERBODY FUSION (TLIF) WITH PEDICLE SCREW FIXATION 1 LEVEL;  Surgeon: Sinclair Ship, MD;  Location: [place];  Service: Orthopedics;  Laterality: Left;  Left sided lumbar 2-3 Transforaminal lumbar interbody fusion with instrumentation, allograft  . TUBAL LIGATION  1970's  . VARICOSE VEIN SURGERY  ~ 2008   bilaterally  . VENTRAL HERNIA REPAIR  05/15/2012   Procedure: LAPAROSCOPIC VENTRAL HERNIA;  Surgeon: Gayland Curry, MD,FACS;  Location: [place];  Service: General;  Laterality: N/A;  laparoscopic ventral incisional hernia repair with mesh    FAMILY HISTORY: Family History  Problem Relation Age of Onset  . Lung cancer Mother   . Cancer Mother        Lung  . COPD Mother   . Varicose Veins Mother   . Diabetes Father   . Cancer Son        Melanoma  . Breast cancer Maternal Grandmother   . Cancer Maternal Grandmother        breast  . Colon cancer Maternal Aunt     SOCIAL HISTORY: Social History   Socioeconomic History  . Marital status: Married    Spouse name: Not on file  . Number of children: 3  . Years of education: 78  . Highest education level: High school graduate  Occupational History  . Occupation: Retired  Tobacco Use  . Smoking status: Former Smoker    Packs/day: 1.00    Years: 10.00    Pack years: 10.00    Types: Cigarettes    Quit date: 05/11/1971    Years since quitting: 48.7  . Smokeless tobacco: Never Used  Vaping Use  . Vaping Use: Never used  Substance and Sexual Activity  . Alcohol use: No    Alcohol/week: 3.0 standard  drinks    Types: 3 Glasses of wine per week    Comment: glass of wine a few times each week  . Drug use: No  . Sexual activity: Not Currently  Other Topics Concern  . Not on file  Social History Narrative   Lives at home with husband.   Right-handed.   Occasional cup of tea.   Social Determinants of Health   Financial Resource Strain:   . Difficulty of Paying Living Expenses:   Food Insecurity:   . Worried About Charity fundraiser in the Last Year:   . [place] in the Last Year:  Transportation Needs:   . Film/video editor (Medical):   Marland Kitchen Lack of Transportation (Non-Medical):   Physical Activity:   . Days of Exercise per Week:   . Minutes of Exercise per Session:   Stress:   . Feeling of Stress :   Social Connections:   . Frequency of Communication with Friends and Family:   . Frequency of Social Gatherings with Friends and Family:   . Attends Religious Services:   . Active Member of Clubs or Organizations:   . Attends Archivist Meetings:   Marland Kitchen Marital Status:   Intimate Partner Violence:   . Fear of Current or Ex-Partner:   . Emotionally Abused:   Marland Kitchen Physically Abused:   . Sexually Abused:      PHYSICAL EXAM   Vitals:   01/20/20 1318  BP: 128/83  Pulse: 75  Weight: 133 lb 8 oz (60.6 kg)  Height: 5\' 5"  (1.651 m)   Not recorded     Body mass index is 22.22 kg/m.  PHYSICAL EXAMNIATION:  Gen: NAD, conversant, well nourised, well groomed                     Cardiovascular: Regular rate rhythm, no peripheral edema, warm, nontender. Eyes: Conjunctivae clear without exudates or hemorrhage Neck: Supple, no carotid bruits. Pulmonary: Clear to auscultation bilaterally   NEUROLOGICAL EXAM:  MENTAL STATUS: MMSE - Mini Mental State Exam 01/20/2020 10/30/2017 10/27/2016  Not completed: - Refused -  Orientation to time 5 - 5  Orientation to Place 5 - 5  Registration 3 - 3  Attention/ Calculation 5 - 5  Recall 2 - 1  Language- name 2 objects  2 - 2  Language- repeat 1 - 1  Language- follow 3 step command 3 - 3  Language- read & follow direction 1 - 1  Write a sentence 1 - 1  Copy design 1 - 1  Total score 29 - 28   Animal naming 6  CRANIAL NERVES: CN II: Visual fields are full to confrontation. Pupils are round equal and briskly reactive to light. CN III, IV, VI: extraocular movement are normal. No ptosis. CN V: Facial sensation is intact to light touch CN VII: Face is symmetric with normal eye closure  CN VIII: Hearing is normal to causal conversation. CN IX, X: Phonation is normal. CN XI: Head turning and shoulder shrug are intact  MOTOR: There is no pronator drift of out-stretched arms. Muscle bulk and tone are normal. Muscle strength is normal.  REFLEXES: Reflexes are 2+ and symmetric at the biceps, triceps; exaggerated hyperreflexia of bilateral knee , plantar responses were flexor SENSORY: Intact to light touch, pinprick and vibratory sensation are intact in fingers and toes.  COORDINATION: There is no trunk or limb dysmetria noted.  GAIT/STANCE: Need push-up to get up from seated position, steady   DIAGNOSTIC DATA (LABS, IMAGING, TESTING) - I reviewed patient records, labs, notes, testing and imaging myself where available.   ASSESSMENT AND PLAN  LADEN FIELDHOUSE is a 79 y.o. female   Mild cognitive impairment  Mini-Mental Status Examination was 29/30, animal naming of 6  MRI of the brain showed no acute abnormality, evidence of mild atrophy, from [facility] in June 2021  Laboratory evaluation failed to demonstrate treatable etiology  Differentiation diagnosis include normal aging versus early stage of central nervous system degenerative disorder  I have advised her to continue moderate exercise  After discussed with patient and her husband, we decided  to proceed with Namenda 5 mg twice a day  Persistent dizziness,  Failed to improve by repositioning maneuver, had a ENT appointment pending  on January 23, 2020   Marcial Pacas, M.D. Ph.D.  [facility] [address], [place], [place] [address] Ph: [phone] Fax: [phone]  CC:  Biagio Borg, MD [address] [place],  [place] [address]   Primary Care Physician Biagio Borg, MD

## 2020-01-21 ENCOUNTER — Telehealth: Payer: Self-pay | Admitting: Neurology

## 2020-01-21 MED ORDER — MEMANTINE HCL 5 MG PO TABS: 5.0000 mg | ORAL_TABLET | Freq: Two times a day (BID) | ORAL | 3 refills | Status: DC

## 2020-01-21 NOTE — Telephone Encounter (Signed)
Arbie Cookey with [facility] called stating that they need a PA for 180 for memantine. Their number is [phone] and the ref # she gave me was [id number].

## 2020-01-21 NOTE — Addendum Note (Signed)
Addended by: Noberto Retort C on: 01/21/2020 01:28 PM   Modules accepted: Orders

## 2020-01-21 NOTE — Telephone Encounter (Addendum)
I returned the call to [facility] and spoke to [facility]. They are filling this prescription for the patient. It does not require a PA. They are just asking for a 90-day supply because it is more cost effective for the patient. 90-day rx has been sent.

## 2020-01-23 DIAGNOSIS — H903 Sensorineural hearing loss, bilateral: Secondary | ICD-10-CM | POA: Diagnosis not present

## 2020-01-23 DIAGNOSIS — H9313 Tinnitus, bilateral: Secondary | ICD-10-CM | POA: Diagnosis not present

## 2020-01-23 DIAGNOSIS — H905 Unspecified sensorineural hearing loss: Secondary | ICD-10-CM | POA: Diagnosis not present

## 2020-01-23 DIAGNOSIS — H838X3 Other specified diseases of inner ear, bilateral: Secondary | ICD-10-CM | POA: Diagnosis not present

## 2020-01-23 DIAGNOSIS — R42 Dizziness and giddiness: Secondary | ICD-10-CM | POA: Diagnosis not present

## 2020-02-05 DIAGNOSIS — I6523 Occlusion and stenosis of bilateral carotid arteries: Secondary | ICD-10-CM | POA: Diagnosis not present

## 2020-02-05 DIAGNOSIS — R42 Dizziness and giddiness: Secondary | ICD-10-CM | POA: Diagnosis not present

## 2020-02-10 ENCOUNTER — Telehealth: Payer: Self-pay

## 2020-02-10 DIAGNOSIS — R251 Tremor, unspecified: Secondary | ICD-10-CM

## 2020-02-10 DIAGNOSIS — M1990 Unspecified osteoarthritis, unspecified site: Secondary | ICD-10-CM | POA: Diagnosis not present

## 2020-02-10 DIAGNOSIS — Z888 Allergy status to other drugs, medicaments and biological substances status: Secondary | ICD-10-CM | POA: Diagnosis not present

## 2020-02-10 DIAGNOSIS — Z87891 Personal history of nicotine dependence: Secondary | ICD-10-CM | POA: Diagnosis not present

## 2020-02-10 DIAGNOSIS — E7800 Pure hypercholesterolemia, unspecified: Secondary | ICD-10-CM | POA: Diagnosis not present

## 2020-02-10 DIAGNOSIS — Z882 Allergy status to sulfonamides status: Secondary | ICD-10-CM | POA: Diagnosis not present

## 2020-02-10 DIAGNOSIS — I10 Essential (primary) hypertension: Secondary | ICD-10-CM | POA: Diagnosis not present

## 2020-02-10 DIAGNOSIS — K219 Gastro-esophageal reflux disease without esophagitis: Secondary | ICD-10-CM | POA: Diagnosis not present

## 2020-02-10 DIAGNOSIS — Z91040 Latex allergy status: Secondary | ICD-10-CM | POA: Diagnosis not present

## 2020-02-10 DIAGNOSIS — Z880 Allergy status to penicillin: Secondary | ICD-10-CM | POA: Diagnosis not present

## 2020-02-10 DIAGNOSIS — R42 Dizziness and giddiness: Secondary | ICD-10-CM | POA: Diagnosis not present

## 2020-02-10 DIAGNOSIS — Z79899 Other long term (current) drug therapy: Secondary | ICD-10-CM | POA: Diagnosis not present

## 2020-02-10 NOTE — Telephone Encounter (Signed)
New message    Husband Jeneen Rinks calling   Darnel has had tremors over the last several weeks taken to [facility]. They did several tests, including CT negative results.   MD suggests she visit Dr. Krista Blue a neurologist as soon as possible. The husband has not contacted the Neurologist.

## 2020-02-10 NOTE — Telephone Encounter (Signed)
New message:   Pt's spouse is calling and states he would like to know when the referral will be placed for his wife. He states if she needs an appt with Dr. Jenny Reichmann first to let him know. Please advise.

## 2020-02-11 NOTE — Telephone Encounter (Signed)
Sent to Dr. John. 

## 2020-02-11 NOTE — Telephone Encounter (Signed)
Ok to have pt f/u with PCCs as she was referred to neurology in may 2021 with memory loss

## 2020-02-11 NOTE — Telephone Encounter (Signed)
Pt was seen by Dr. Krista Blue as a new patient on 6/28. Pt's husband just needs to call them.

## 2020-02-12 NOTE — Telephone Encounter (Signed)
Referral has been sent to [facility]

## 2020-02-12 NOTE — Telephone Encounter (Signed)
I have spoke to the pt's husband and he stated that was correct but he has already called trying to get an appointment and Dr. Greer Pickerel office stated that they needed a new referral from PCP.

## 2020-02-12 NOTE — Telephone Encounter (Signed)
Hey Dr. Jenny Reichmann,  So pt was seen on 6/28 at [facility] with past referral and they told pt's husband they are needing a new referral. Possibly because of the tremors? Can you please put new referral in?  Thank you

## 2020-02-12 NOTE — Telephone Encounter (Signed)
Ok this is done 

## 2020-02-12 NOTE — Addendum Note (Signed)
Addended by: Biagio Borg on: 02/12/2020 01:04 PM   Modules accepted: Orders

## 2020-03-06 DIAGNOSIS — G253 Myoclonus: Secondary | ICD-10-CM | POA: Diagnosis not present

## 2020-03-06 DIAGNOSIS — R42 Dizziness and giddiness: Secondary | ICD-10-CM | POA: Diagnosis not present

## 2020-03-31 DIAGNOSIS — G253 Myoclonus: Secondary | ICD-10-CM | POA: Diagnosis not present

## 2020-04-14 ENCOUNTER — Institutional Professional Consult (permissible substitution): Payer: Medicare Other | Admitting: Neurology

## 2020-04-17 DIAGNOSIS — Z23 Encounter for immunization: Secondary | ICD-10-CM | POA: Diagnosis not present

## 2020-04-20 DIAGNOSIS — Z23 Encounter for immunization: Secondary | ICD-10-CM | POA: Diagnosis not present

## 2020-05-04 DIAGNOSIS — R42 Dizziness and giddiness: Secondary | ICD-10-CM | POA: Diagnosis not present

## 2020-05-08 ENCOUNTER — Ambulatory Visit (INDEPENDENT_AMBULATORY_CARE_PROVIDER_SITE_OTHER): Payer: Medicare Other | Admitting: Internal Medicine

## 2020-05-08 ENCOUNTER — Other Ambulatory Visit: Payer: Self-pay

## 2020-05-08 ENCOUNTER — Encounter: Payer: Self-pay | Admitting: Internal Medicine

## 2020-05-08 VITALS — BP 132/82 | HR 73 | Temp 98.6°F | Ht 65.0 in | Wt 133.0 lb

## 2020-05-08 DIAGNOSIS — F32A Depression, unspecified: Secondary | ICD-10-CM | POA: Diagnosis not present

## 2020-05-08 DIAGNOSIS — I10 Essential (primary) hypertension: Secondary | ICD-10-CM

## 2020-05-08 DIAGNOSIS — R413 Other amnesia: Secondary | ICD-10-CM

## 2020-05-08 DIAGNOSIS — R42 Dizziness and giddiness: Secondary | ICD-10-CM | POA: Diagnosis not present

## 2020-05-08 DIAGNOSIS — E785 Hyperlipidemia, unspecified: Secondary | ICD-10-CM

## 2020-05-08 NOTE — Patient Instructions (Addendum)
Ok to stop the HCT fluid pill 12.5 mg  Please continue to monitor your BP at home on a regular basis, and please call in 1 wk if average is more than 140/90 to consider increased amlodipine to 5 mg per day  Please continue all other medications as before, and refills have been done if requested.  Please have the pharmacy call with any other refills you may need.  Please continue your efforts at being more active, low cholesterol diet, and weight control.  Please keep your appointments with your specialists as you may have planned  Please make an Appointment to return in 3 months

## 2020-05-08 NOTE — Progress Notes (Signed)
Subjective:    Patient ID: Crystal Herrera, female    DOB: Aug 30, 1940, 79 y.o.   MRN: [id number]  HPI  Here with me today to f/u unexplained dizziness mild intermittent since July 2021 persistent, nothing seems to make better or worse, has been seen per neurolog with extensive evaluation, husband brings multiple files of paperwork to review, all testing unable to determine cause.  Pt c/o dizziness sitting up in chair today.  Did see uc with [facility] noted and asked to bring that here to review.  Pt is on mild diuretic.  Pt denies chest pain, increased sob or doe, wheezing, orthopnea, PND, increased LE swelling, palpitations.  Pt denies new neurological symptoms such as new headache, or facial or extremity weakness or numbness   Pt denies polydipsia, polyuria,  Denies worsening depressive symptoms, suicidal ideation, or panic Dementia overall stable symptomatically, and not assoc with behavioral changes such as hallucinations, paranoia Past Medical History:  Diagnosis Date  . Allergic rhinitis, cause unspecified 01/29/2013  . Anemia 1948?  Marland Kitchen BACK PAIN, CHRONIC 02/09/2010  . Cancer ([place])    SKIN  . Cataract   . COLONIC POLYPS, HX OF 02/09/2010  . COMMON MIGRAINE 02/09/2010   neuritis in R side of neck & head  . DEGENERATIVE JOINT DISEASE 02/09/2010   hands , lumbar stenosis   . DEPRESSION 02/09/2010   pt. takes Cymbalta for pain issues   . GERD 02/09/2010  . HYPERLIPIDEMIA 02/09/2010  . HYPERTENSION 02/09/2010  . Lumbar disc disease   . Memory loss   . Neuromuscular disorder ([place])    neuritis in head & neck  . OCCIPITAL NEURALGIA 06/09/2010  . OSTEOPENIA 02/09/2010  . Pneumonia    "as an infant" (05/15/2012)  . Varicose vein of leg    BLE   Past Surgical History:  Procedure Laterality Date  . BREAST BIOPSY  1988   left; Neg  . CHOLECYSTECTOMY  ? 2009  . chymopapain injection  1986   Lumbar  . COLONOSCOPY    . DILATION AND CURETTAGE OF UTERUS  1960's  . HERNIA REPAIR   05/15/2012   ventral incisional   . LUMBAR FUSION  05/22/2013   L2  L3 decompression /fusion      Dr Lynann Bologna  . LUMBAR LAMINECTOMY  1986  . OVARIAN CYST REMOVAL  2006  . Stimulator placed   07/2014   placed 2 years ago for pain  . TRANSFORAMINAL LUMBAR INTERBODY FUSION (TLIF) WITH PEDICLE SCREW FIXATION 1 LEVEL Left 05/22/2013   Procedure: TRANSFORAMINAL LUMBAR INTERBODY FUSION (TLIF) WITH PEDICLE SCREW FIXATION 1 LEVEL;  Surgeon: Sinclair Ship, MD;  Location: [place];  Service: Orthopedics;  Laterality: Left;  Left sided lumbar 2-3 Transforaminal lumbar interbody fusion with instrumentation, allograft  . TUBAL LIGATION  1970's  . VARICOSE VEIN SURGERY  ~ 2008   bilaterally  . VENTRAL HERNIA REPAIR  05/15/2012   Procedure: LAPAROSCOPIC VENTRAL HERNIA;  Surgeon: Gayland Curry, MD,FACS;  Location: [place];  Service: General;  Laterality: N/A;  laparoscopic ventral incisional hernia repair with mesh    reports that she quit smoking about 49 years ago. Her smoking use included cigarettes. She has a 10.00 pack-year smoking history. She has never used smokeless tobacco. She reports that she does not drink alcohol and does not use drugs. family history includes Breast cancer in her maternal grandmother; COPD in her mother; Cancer in her maternal grandmother, mother, and son; Colon cancer in her maternal aunt; Diabetes in  her father; Lung cancer in her mother; Varicose Veins in her mother. Allergies  Allergen Reactions  . Cefuroxime     Might cause hives  . Cephalosporins     Might cause hives   . Latex Nausea Only  . Celexa [Citalopram Hydrobromide] Nausea Only  . Dexlansoprazole Anxiety    Sleeplessness heart racing headaches chiulls  . Lexapro [Escitalopram Oxalate] Nausea Only  . Penicillins Hives  . Sulfonamide Derivatives Hives  . Vancomycin Rash   Current Outpatient Medications on File Prior to Visit  Medication Sig Dispense Refill  . amLODipine (NORVASC) 2.5 MG tablet Take 1  tablet (2.5 mg total) by mouth daily. 90 tablet 3  . Biotin 1000 MCG tablet Take by mouth.    . calcium carbonate (OSCAL) 1500 (600 Ca) MG TABS tablet Take by mouth.    . Cholecalciferol (VITAMIN D3 PO) Take 2,000 Units by mouth daily.     . meclizine (ANTIVERT) 25 MG tablet Take 25 mg by mouth 3 (three) times daily as needed.    . memantine (NAMENDA) 5 MG tablet Take 1 tablet (5 mg total) by mouth 2 (two) times daily. 180 tablet 3  . simvastatin (ZOCOR) 20 MG tablet Take 1 tablet by mouth every day at 6pm 90 tablet 3   No current facility-administered medications on file prior to visit.   Review of Systems All otherwise neg per pt    Objective:   Physical Exam BP 132/82 (BP Location: Left Arm, Patient Position: Sitting, Cuff Size: Large)   Pulse 73   Temp 98.6 F (37 C) (Oral)   Ht 5\' 5"  (1.651 m)   Wt 133 lb (60.3 kg)   SpO2 94%   BMI 22.13 kg/m  VS noted,  Constitutional: Pt appears in NAD HENT: Head: NCAT.  Right Ear: External ear normal.  Left Ear: External ear normal.  Eyes: . Pupils are equal, round, and reactive to light. Conjunctivae and EOM are normal Nose: without d/c or deformity Neck: Neck supple. Gross normal ROM Cardiovascular: Normal rate and regular rhythm.   Pulmonary/Chest: Effort normal and breath sounds without rales or wheezing.  Abd:  Soft, NT, ND, + BS, no organomegaly Neurological: Pt is alert. At baseline orientation, motor grossly intact Skin: Skin is warm. No rashes, other new lesions, no LE edema Psychiatric: Pt behavior is normal without agitation  All otherwise neg per pt Lab Results  Component Value Date   WBC 8.1 11/14/2019   HGB 15.3 (H) 11/14/2019   HCT 45.5 11/14/2019   PLT 247.0 11/14/2019   GLUCOSE 96 11/14/2019   CHOL 161 11/14/2019   TRIG 93.0 11/14/2019   HDL 73.80 11/14/2019   LDLDIRECT 122.2 02/09/2010   LDLCALC 69 11/14/2019   ALT 17 11/14/2019   AST 22 11/14/2019   NA 135 11/14/2019   K 4.1 11/14/2019   CL 96  11/14/2019   CREATININE 0.73 11/14/2019   BUN 13 11/14/2019   CO2 31 11/14/2019   TSH 1.95 11/14/2019   INR 0.97 05/22/2013   HGBA1C 5.4 11/14/2019      Assessment & Plan:

## 2020-05-10 ENCOUNTER — Encounter: Payer: Self-pay | Admitting: Internal Medicine

## 2020-05-10 NOTE — Assessment & Plan Note (Signed)
stable overall by history and exam, recent data reviewed with pt, and pt to continue medical treatment as before,  to f/u any worsening symptoms or concerns  

## 2020-05-10 NOTE — Assessment & Plan Note (Signed)
Mild now chronic in past 6 months, follows with neurology

## 2020-05-10 NOTE — Assessment & Plan Note (Addendum)
Etiology unclear, ok to d/c the hct today,  to f/u any worsening symptoms or concerns  I spent 41 minutes in preparing to see the patient by review of recent labs, imaging and procedures, obtaining and reviewing separately obtained history, communicating with the patient and family or caregiver, ordering medications, tests or procedures, and documenting clinical information in the EHR including the differential Dx, treatment, and any further evaluation and other management of dizziness, memory loss, depression, htn, hld

## 2020-05-18 DIAGNOSIS — L821 Other seborrheic keratosis: Secondary | ICD-10-CM | POA: Diagnosis not present

## 2020-05-18 DIAGNOSIS — L820 Inflamed seborrheic keratosis: Secondary | ICD-10-CM | POA: Diagnosis not present

## 2020-05-18 DIAGNOSIS — D1801 Hemangioma of skin and subcutaneous tissue: Secondary | ICD-10-CM | POA: Diagnosis not present

## 2020-05-18 DIAGNOSIS — Z85828 Personal history of other malignant neoplasm of skin: Secondary | ICD-10-CM | POA: Diagnosis not present

## 2020-05-25 DIAGNOSIS — H5211 Myopia, right eye: Secondary | ICD-10-CM | POA: Diagnosis not present

## 2020-05-25 DIAGNOSIS — Z961 Presence of intraocular lens: Secondary | ICD-10-CM | POA: Diagnosis not present

## 2020-05-25 DIAGNOSIS — H3589 Other specified retinal disorders: Secondary | ICD-10-CM | POA: Diagnosis not present

## 2020-05-25 DIAGNOSIS — H26493 Other secondary cataract, bilateral: Secondary | ICD-10-CM | POA: Diagnosis not present

## 2020-06-08 DIAGNOSIS — H43813 Vitreous degeneration, bilateral: Secondary | ICD-10-CM | POA: Diagnosis not present

## 2020-06-08 DIAGNOSIS — H02831 Dermatochalasis of right upper eyelid: Secondary | ICD-10-CM | POA: Diagnosis not present

## 2020-06-08 DIAGNOSIS — Z961 Presence of intraocular lens: Secondary | ICD-10-CM | POA: Diagnosis not present

## 2020-06-08 DIAGNOSIS — H26493 Other secondary cataract, bilateral: Secondary | ICD-10-CM | POA: Diagnosis not present

## 2020-06-08 DIAGNOSIS — H02834 Dermatochalasis of left upper eyelid: Secondary | ICD-10-CM | POA: Diagnosis not present

## 2020-06-30 DIAGNOSIS — H26492 Other secondary cataract, left eye: Secondary | ICD-10-CM | POA: Diagnosis not present

## 2020-07-01 DIAGNOSIS — Z8639 Personal history of other endocrine, nutritional and metabolic disease: Secondary | ICD-10-CM | POA: Diagnosis not present

## 2020-07-01 DIAGNOSIS — Z79899 Other long term (current) drug therapy: Secondary | ICD-10-CM | POA: Diagnosis not present

## 2020-07-01 DIAGNOSIS — Z87891 Personal history of nicotine dependence: Secondary | ICD-10-CM | POA: Diagnosis not present

## 2020-07-01 DIAGNOSIS — M810 Age-related osteoporosis without current pathological fracture: Secondary | ICD-10-CM | POA: Diagnosis not present

## 2020-07-14 ENCOUNTER — Ambulatory Visit: Payer: Medicare Other | Admitting: Neurology

## 2020-07-16 ENCOUNTER — Encounter: Payer: Self-pay | Admitting: Neurology

## 2020-07-16 ENCOUNTER — Ambulatory Visit (INDEPENDENT_AMBULATORY_CARE_PROVIDER_SITE_OTHER): Payer: Medicare Other | Admitting: Neurology

## 2020-07-16 ENCOUNTER — Other Ambulatory Visit: Payer: Self-pay

## 2020-07-16 VITALS — BP 148/82 | HR 73 | Ht 65.0 in | Wt 139.0 lb

## 2020-07-16 DIAGNOSIS — G3184 Mild cognitive impairment, so stated: Secondary | ICD-10-CM | POA: Diagnosis not present

## 2020-07-16 MED ORDER — MEMANTINE HCL 10 MG PO TABS: 10.0000 mg | ORAL_TABLET | Freq: Two times a day (BID) | ORAL | 4 refills | Status: DC

## 2020-07-16 MED ORDER — DONEPEZIL HCL 10 MG PO TABS: 10.0000 mg | ORAL_TABLET | Freq: Every day | ORAL | 11 refills | Status: DC

## 2020-07-16 NOTE — Progress Notes (Signed)
HISTORICAL  Crystal Herrera is a 79 year old female, seen in request by her primary care physician Dr. Cathlean Cower for evaluation of memory loss, she is accompanied by her husband at today's clinical visit on January 20, 2020.  I reviewed and summarized the referring note. 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, reading [employer], she denies family history of dementia  Since early 2021, she noticed mild memory loss, word finding difficulties, has difficulty focusing on her yearly task of making yearly Christmas ornament for 8 grandchildren, she also complains of low back pain, has to break from her routine of walking regularly, she spent a lot of time sitting down, reading, denies difficulty keeping up with her reading  She no longer feel confident driving because she has trouble figuring out the direction,  MRI of the brain without contrast in June 2021 from [facility] system, generalized atrophy, no acute abnormality  Laboratory evaluations, October 2020, normal vitamin D, CMP, CBC hemoglobin 15.3, TSH, B12, A1c, 5.4, fasting lipid panel, LDL 69  She had a history of intermittent dizziness in the past, often with improved quickly by repositioning maneuver, and vestibular exercise, this particular episode has been ongoing for 2 weeks, despite multiple attempts of Apley's maneuver, she continue complains of dizziness, it can happen in a sitting down position, especially when she gets up, she also complains of few years history of loud tinnitus, mild decreased hearing, has ENT appointment pending  UPDATE Jul 16 2020: Accompanied by her husband at today's visit, tolerating Namenda 5 mg twice a day, no significant change in her memory,  She was also seen by neurologist Dr.  Jannifer Franklin in August 2021, EEG showed mild slowing, no evidence of epileptiform discharge  Ultrasound of carotid artery showed no hemodynamic stenosis  CT head without contrast showed no acute abnormalities  Laboratory evaluation in December 2021, vitamin D was 45, CMP, creatinine of 0.73, normal CBC hemoglobin of 14.9, LDL of 88  She was also seen by ENT, no clear etiology found, because her frequent complains of lightheadedness in a sitting or standing position, hypertension agent hydrochlorothiazide was replaced with very low-dose Norvasc 2.5 mg daily, no significant difference noted, she exercise regularly, swimming   REVIEW OF SYSTEMS: Full 14 system review of systems performed and notable only for as above All other review of systems were negative.  ALLERGIES: Allergies  Allergen Reactions  . Cefuroxime     Might cause hives  . Cephalosporins     Might cause hives   . Latex Nausea Only  . Celexa [Citalopram Hydrobromide] Nausea Only  . Dexlansoprazole Anxiety    Sleeplessness heart racing headaches chiulls  . Lexapro [Escitalopram Oxalate] Nausea Only  . Penicillins Hives  . Sulfonamide Derivatives Hives  . Vancomycin Rash    HOME MEDICATIONS: Current Outpatient Medications  Medication Sig Dispense Refill  . amLODipine (NORVASC) 2.5 MG tablet Take 1 tablet (2.5 mg total) by mouth daily. 90 tablet 3  . Biotin 1000 MCG tablet Take by mouth.    . calcium carbonate (OSCAL) 1500 (600 Ca) MG TABS tablet Take by mouth.    . Cholecalciferol (VITAMIN D3 PO) Take 2,000 Units by mouth daily.     . meclizine (ANTIVERT) 25 MG tablet Take 25 mg by mouth 3 (three) times  daily as needed.    . memantine (NAMENDA) 5 MG tablet Take 1 tablet (5 mg total) by mouth 2 (two) times daily. 180 tablet 3  . simvastatin (ZOCOR) 20 MG tablet Take 1 tablet by mouth every day at 6pm 90 tablet 3   No current facility-administered medications for this visit.    PAST MEDICAL HISTORY: Past Medical  History:  Diagnosis Date  . Allergic rhinitis, cause unspecified 01/29/2013  . Anemia 1948?  Marland Kitchen BACK PAIN, CHRONIC 02/09/2010  . Cancer ([place])    SKIN  . Cataract   . COLONIC POLYPS, HX OF 02/09/2010  . COMMON MIGRAINE 02/09/2010   neuritis in R side of neck & head  . DEGENERATIVE JOINT DISEASE 02/09/2010   hands , lumbar stenosis   . DEPRESSION 02/09/2010   pt. takes Cymbalta for pain issues   . GERD 02/09/2010  . HYPERLIPIDEMIA 02/09/2010  . HYPERTENSION 02/09/2010  . Lumbar disc disease   . Memory loss   . Neuromuscular disorder ([place])    neuritis in head & neck  . OCCIPITAL NEURALGIA 06/09/2010  . OSTEOPENIA 02/09/2010  . Pneumonia    "as an infant" (05/15/2012)  . Varicose vein of leg    BLE    PAST SURGICAL HISTORY: Past Surgical History:  Procedure Laterality Date  . BREAST BIOPSY  1988   left; Neg  . CHOLECYSTECTOMY  ? 2009  . chymopapain injection  1986   Lumbar  . COLONOSCOPY    . DILATION AND CURETTAGE OF UTERUS  1960's  . HERNIA REPAIR  05/15/2012   ventral incisional   . LUMBAR FUSION  05/22/2013   L2  L3 decompression /fusion      Dr Lynann Bologna  . LUMBAR LAMINECTOMY  1986  . OVARIAN CYST REMOVAL  2006  . Stimulator placed   07/2014   placed 2 years ago for pain  . TRANSFORAMINAL LUMBAR INTERBODY FUSION (TLIF) WITH PEDICLE SCREW FIXATION 1 LEVEL Left 05/22/2013   Procedure: TRANSFORAMINAL LUMBAR INTERBODY FUSION (TLIF) WITH PEDICLE SCREW FIXATION 1 LEVEL;  Surgeon: Sinclair Ship, MD;  Location: [place];  Service: Orthopedics;  Laterality: Left;  Left sided lumbar 2-3 Transforaminal lumbar interbody fusion with instrumentation, allograft  . TUBAL LIGATION  1970's  . VARICOSE VEIN SURGERY  ~ 2008   bilaterally  . VENTRAL HERNIA REPAIR  05/15/2012   Procedure: LAPAROSCOPIC VENTRAL HERNIA;  Surgeon: Gayland Curry, MD,FACS;  Location: [place];  Service: General;  Laterality: N/A;  laparoscopic ventral incisional hernia repair with mesh    FAMILY HISTORY: Family  History  Problem Relation Age of Onset  . Lung cancer Mother   . Cancer Mother        Lung  . COPD Mother   . Varicose Veins Mother   . Diabetes Father   . Cancer Son        Melanoma  . Breast cancer Maternal Grandmother   . Cancer Maternal Grandmother        breast  . Colon cancer Maternal Aunt     SOCIAL HISTORY: Social History   Socioeconomic History  . Marital status: Married    Spouse name: Not on file  . Number of children: 3  . Years of education: 70  . Highest education level: High school graduate  Occupational History  . Occupation: Retired  Tobacco Use  . Smoking status: Former Smoker    Packs/day: 1.00    Years: 10.00    Pack years: 10.00    Types: Cigarettes  Quit date: 05/11/1971    Years since quitting: 49.2  . Smokeless tobacco: Never Used  Vaping Use  . Vaping Use: Never used  Substance and Sexual Activity  . Alcohol use: No    Alcohol/week: 3.0 standard drinks    Types: 3 Glasses of wine per week    Comment: glass of wine a few times each week  . Drug use: No  . Sexual activity: Not Currently  Other Topics Concern  . Not on file  Social History Narrative   Lives at home with husband.   Right-handed.   Occasional cup of tea.   Social Determinants of Health   Financial Resource Strain: Not on file  Food Insecurity: Not on file  Transportation Needs: Not on file  Physical Activity: Not on file  Stress: Not on file  Social Connections: Not on file  Intimate Partner Violence: Not on file     PHYSICAL EXAM   Vitals:   07/16/20 1057  BP: (!) 148/82  Pulse: 73  Weight: 139 lb (63 kg)  Height: 5\' 5"  (1.651 m)   Not recorded     Body mass index is 23.13 kg/m.  PHYSICAL EXAMNIATION:  Gen: NAD, conversant, well nourised, well groomed                     Cardiovascular: Regular rate rhythm, no peripheral edema, warm, nontender. Eyes: Conjunctivae clear without exudates or hemorrhage Neck: Supple, no carotid bruits. Pulmonary:  Clear to auscultation bilaterally   NEUROLOGICAL EXAM:  MENTAL STATUS: MMSE - Mini Mental State Exam 07/16/2020 01/20/2020 10/30/2017  Not completed: - - Refused  Orientation to time 5 5 -  Orientation to Place 5 5 -  Registration 3 3 -  Attention/ Calculation 5 5 -  Recall 2 2 -  Language- name 2 objects 2 2 -  Language- repeat 1 1 -  Language- follow 3 step command 3 3 -  Language- read & follow direction 1 1 -  Write a sentence 1 1 -  Copy design 1 1 -  Total score 29 29 -   Animal naming 6  CRANIAL NERVES: CN II: Visual fields are full to confrontation. Pupils are round equal and briskly reactive to light. CN III, IV, VI: extraocular movement are normal. No ptosis. CN V: Facial sensation is intact to light touch CN VII: Face is symmetric with normal eye closure  CN VIII: Hearing is normal to causal conversation. CN IX, X: Phonation is normal. CN XI: Head turning and shoulder shrug are intact  MOTOR: There is no pronator drift of out-stretched arms. Muscle bulk and tone are normal. Muscle strength is normal.  REFLEXES: Reflexes are 2+ and symmetric at the biceps, triceps; patellar and ankle plantar responses were flexor SENSORY: Intact to light touch, pinprick and vibratory sensation are intact in fingers and toes.  COORDINATION: There is no trunk or limb dysmetria noted.  GAIT/STANCE: Need push-up to get up from seated position, steady   DIAGNOSTIC DATA (LABS, IMAGING, TESTING) - I reviewed patient records, labs, notes, testing and imaging myself where available.   ASSESSMENT AND PLAN  Crystal Herrera is a 79 y.o. female   Mild cognitive impairment  Mini-Mental Status Examination was 29/30, animal naming of 6  MRI of the brain showed no acute abnormality, evidence of mild atrophy, from outside hospital in June 2021  Laboratory evaluation failed to demonstrate treatable etiology  Differentiation diagnosis include normal aging versus early stage of central  nervous system degenerative disorder  I have advised her to continue moderate exercise  Increase Namenda to 10 mg twice a day, add on Aricept 10 mg daily   Marcial Pacas, M.D. Ph.D.  [facility] [address] [place], [place] [address] Ph: [phone] Fax: [phone]  CC:  Biagio Borg, MD [address] [place],  [place] [address]   Primary Care Physician Biagio Borg, MD

## 2020-07-21 ENCOUNTER — Ambulatory Visit: Payer: Medicare Other | Admitting: Neurology

## 2020-07-21 DIAGNOSIS — M79672 Pain in left foot: Secondary | ICD-10-CM | POA: Diagnosis not present

## 2020-07-21 DIAGNOSIS — G5762 Lesion of plantar nerve, left lower limb: Secondary | ICD-10-CM | POA: Diagnosis not present

## 2020-07-27 ENCOUNTER — Telehealth: Payer: Self-pay | Admitting: Neurology

## 2020-07-27 NOTE — Telephone Encounter (Signed)
Pt. states she asked for memantine (NAMENDA) 10 MG tablet not to be sent to [facility] pharmacy. She states she already has this prescription from [facility]. She states she received the notification last week that this pres. was being sent there & asks that it be cancelled.

## 2020-07-27 NOTE — Telephone Encounter (Signed)
Per Epic, this prescription was sent to [facility] on 07/16/20 and not [facility].

## 2020-08-11 DIAGNOSIS — M79672 Pain in left foot: Secondary | ICD-10-CM | POA: Diagnosis not present

## 2020-08-11 DIAGNOSIS — G5762 Lesion of plantar nerve, left lower limb: Secondary | ICD-10-CM | POA: Diagnosis not present

## 2020-08-27 DIAGNOSIS — R42 Dizziness and giddiness: Secondary | ICD-10-CM | POA: Diagnosis not present

## 2020-08-27 DIAGNOSIS — H9319 Tinnitus, unspecified ear: Secondary | ICD-10-CM | POA: Diagnosis not present

## 2020-08-27 DIAGNOSIS — H903 Sensorineural hearing loss, bilateral: Secondary | ICD-10-CM | POA: Diagnosis not present

## 2020-08-27 DIAGNOSIS — I10 Essential (primary) hypertension: Secondary | ICD-10-CM | POA: Diagnosis not present

## 2020-09-01 DIAGNOSIS — G5762 Lesion of plantar nerve, left lower limb: Secondary | ICD-10-CM | POA: Diagnosis not present

## 2020-09-01 DIAGNOSIS — M79672 Pain in left foot: Secondary | ICD-10-CM | POA: Diagnosis not present

## 2020-09-14 DIAGNOSIS — R030 Elevated blood-pressure reading, without diagnosis of hypertension: Secondary | ICD-10-CM | POA: Diagnosis not present

## 2020-09-14 DIAGNOSIS — R0789 Other chest pain: Secondary | ICD-10-CM | POA: Diagnosis not present

## 2020-09-14 DIAGNOSIS — R079 Chest pain, unspecified: Secondary | ICD-10-CM | POA: Diagnosis not present

## 2020-09-14 DIAGNOSIS — I10 Essential (primary) hypertension: Secondary | ICD-10-CM | POA: Diagnosis not present

## 2020-09-14 DIAGNOSIS — E785 Hyperlipidemia, unspecified: Secondary | ICD-10-CM | POA: Diagnosis not present

## 2020-09-14 DIAGNOSIS — F0390 Unspecified dementia without behavioral disturbance: Secondary | ICD-10-CM | POA: Diagnosis not present

## 2020-09-14 DIAGNOSIS — R251 Tremor, unspecified: Secondary | ICD-10-CM | POA: Diagnosis not present

## 2020-09-24 DIAGNOSIS — R2689 Other abnormalities of gait and mobility: Secondary | ICD-10-CM | POA: Diagnosis not present

## 2020-09-24 DIAGNOSIS — M6281 Muscle weakness (generalized): Secondary | ICD-10-CM | POA: Diagnosis not present

## 2020-09-24 DIAGNOSIS — R42 Dizziness and giddiness: Secondary | ICD-10-CM | POA: Diagnosis not present

## 2020-09-29 DIAGNOSIS — R42 Dizziness and giddiness: Secondary | ICD-10-CM | POA: Diagnosis not present

## 2020-09-29 DIAGNOSIS — R2689 Other abnormalities of gait and mobility: Secondary | ICD-10-CM | POA: Diagnosis not present

## 2020-09-29 DIAGNOSIS — M6281 Muscle weakness (generalized): Secondary | ICD-10-CM | POA: Diagnosis not present

## 2020-10-01 DIAGNOSIS — R42 Dizziness and giddiness: Secondary | ICD-10-CM | POA: Diagnosis not present

## 2020-10-01 DIAGNOSIS — R2689 Other abnormalities of gait and mobility: Secondary | ICD-10-CM | POA: Diagnosis not present

## 2020-10-01 DIAGNOSIS — M6281 Muscle weakness (generalized): Secondary | ICD-10-CM | POA: Diagnosis not present

## 2020-10-06 DIAGNOSIS — R2689 Other abnormalities of gait and mobility: Secondary | ICD-10-CM | POA: Diagnosis not present

## 2020-10-06 DIAGNOSIS — M6281 Muscle weakness (generalized): Secondary | ICD-10-CM | POA: Diagnosis not present

## 2020-10-06 DIAGNOSIS — R42 Dizziness and giddiness: Secondary | ICD-10-CM | POA: Diagnosis not present

## 2020-10-08 DIAGNOSIS — M6281 Muscle weakness (generalized): Secondary | ICD-10-CM | POA: Diagnosis not present

## 2020-10-08 DIAGNOSIS — R42 Dizziness and giddiness: Secondary | ICD-10-CM | POA: Diagnosis not present

## 2020-10-08 DIAGNOSIS — R2689 Other abnormalities of gait and mobility: Secondary | ICD-10-CM | POA: Diagnosis not present

## 2020-10-13 DIAGNOSIS — R42 Dizziness and giddiness: Secondary | ICD-10-CM | POA: Diagnosis not present

## 2020-10-13 DIAGNOSIS — R2689 Other abnormalities of gait and mobility: Secondary | ICD-10-CM | POA: Diagnosis not present

## 2020-10-13 DIAGNOSIS — M6281 Muscle weakness (generalized): Secondary | ICD-10-CM | POA: Diagnosis not present

## 2020-10-15 DIAGNOSIS — R2689 Other abnormalities of gait and mobility: Secondary | ICD-10-CM | POA: Diagnosis not present

## 2020-10-15 DIAGNOSIS — M6281 Muscle weakness (generalized): Secondary | ICD-10-CM | POA: Diagnosis not present

## 2020-10-15 DIAGNOSIS — R42 Dizziness and giddiness: Secondary | ICD-10-CM | POA: Diagnosis not present

## 2020-10-20 DIAGNOSIS — R42 Dizziness and giddiness: Secondary | ICD-10-CM | POA: Diagnosis not present

## 2020-10-20 DIAGNOSIS — M6281 Muscle weakness (generalized): Secondary | ICD-10-CM | POA: Diagnosis not present

## 2020-10-20 DIAGNOSIS — R2689 Other abnormalities of gait and mobility: Secondary | ICD-10-CM | POA: Diagnosis not present

## 2020-10-26 ENCOUNTER — Encounter: Payer: Self-pay | Admitting: Internal Medicine

## 2020-10-26 ENCOUNTER — Ambulatory Visit (INDEPENDENT_AMBULATORY_CARE_PROVIDER_SITE_OTHER): Payer: Medicare Other | Admitting: Internal Medicine

## 2020-10-26 ENCOUNTER — Other Ambulatory Visit: Payer: Self-pay

## 2020-10-26 VITALS — BP 118/78 | HR 72 | Temp 98.2°F | Ht 65.0 in | Wt 138.0 lb

## 2020-10-26 DIAGNOSIS — I10 Essential (primary) hypertension: Secondary | ICD-10-CM

## 2020-10-26 DIAGNOSIS — R42 Dizziness and giddiness: Secondary | ICD-10-CM | POA: Diagnosis not present

## 2020-10-26 DIAGNOSIS — E785 Hyperlipidemia, unspecified: Secondary | ICD-10-CM

## 2020-10-26 DIAGNOSIS — G3184 Mild cognitive impairment, so stated: Secondary | ICD-10-CM | POA: Diagnosis not present

## 2020-10-26 DIAGNOSIS — E538 Deficiency of other specified B group vitamins: Secondary | ICD-10-CM | POA: Diagnosis not present

## 2020-10-26 DIAGNOSIS — E559 Vitamin D deficiency, unspecified: Secondary | ICD-10-CM | POA: Diagnosis not present

## 2020-10-26 DIAGNOSIS — R739 Hyperglycemia, unspecified: Secondary | ICD-10-CM

## 2020-10-26 LAB — LIPID PANEL
Cholesterol: 164 mg/dL (ref 0–200)
HDL: 73.2 mg/dL (ref >39.00)
LDL Cholesterol: 75 mg/dL (ref 0–99)
NonHDL: 90.92
Total CHOL/HDL Ratio: 2
Triglycerides: 79 mg/dL (ref 0.0–149.0)
VLDL: 15.8 mg/dL (ref 0.0–40.0)

## 2020-10-26 LAB — CBC WITH DIFFERENTIAL/PLATELET
Basophils Absolute: 0 10*3/uL (ref 0.0–0.1)
Basophils Relative: 0.5 % (ref 0.0–3.0)
Eosinophils Absolute: 0 10*3/uL (ref 0.0–0.7)
Eosinophils Relative: 0.6 % (ref 0.0–5.0)
HCT: 45.5 % (ref 36.0–46.0)
Hemoglobin: 15.3 g/dL — ABNORMAL HIGH (ref 12.0–15.0)
Lymphocytes Relative: 19.8 % (ref 12.0–46.0)
Lymphs Abs: 1.2 10*3/uL (ref 0.7–4.0)
MCHC: 33.6 g/dL (ref 30.0–36.0)
MCV: 95 fl (ref 78.0–100.0)
Monocytes Absolute: 0.4 10*3/uL (ref 0.1–1.0)
Monocytes Relative: 7.4 % (ref 3.0–12.0)
Neutro Abs: 4.2 10*3/uL (ref 1.4–7.7)
Neutrophils Relative %: 71.7 % (ref 43.0–77.0)
Platelets: 234 10*3/uL (ref 150.0–400.0)
RBC: 4.79 Mil/uL (ref 3.87–5.11)
RDW: 13.9 % (ref 11.5–15.5)
WBC: 5.9 10*3/uL (ref 4.0–10.5)

## 2020-10-26 LAB — URINALYSIS, ROUTINE W REFLEX MICROSCOPIC
Bilirubin Urine: NEGATIVE
Hgb urine dipstick: NEGATIVE
Ketones, ur: NEGATIVE
Leukocytes,Ua: NEGATIVE
Nitrite: NEGATIVE
RBC / HPF: NONE SEEN (ref 0–?)
Specific Gravity, Urine: 1.02 (ref 1.000–1.030)
Total Protein, Urine: NEGATIVE
Urine Glucose: NEGATIVE
Urobilinogen, UA: 0.2 (ref 0.0–1.0)
WBC, UA: NONE SEEN (ref 0–?)
pH: 6 (ref 5.0–8.0)

## 2020-10-26 LAB — BASIC METABOLIC PANEL
BUN: 15 mg/dL (ref 6–23)
CO2: 29 mEq/L (ref 19–32)
Calcium: 9.9 mg/dL (ref 8.4–10.5)
Chloride: 103 mEq/L (ref 96–112)
Creatinine, Ser: 0.82 mg/dL (ref 0.40–1.20)
GFR: 67.5 mL/min (ref >60.00)
Glucose, Bld: 82 mg/dL (ref 70–99)
Potassium: 4.3 mEq/L (ref 3.5–5.1)
Sodium: 138 mEq/L (ref 135–145)

## 2020-10-26 LAB — VITAMIN B12: Vitamin B-12: 337 pg/mL (ref 211–911)

## 2020-10-26 LAB — HEPATIC FUNCTION PANEL
ALT: 14 U/L (ref 0–35)
AST: 18 U/L (ref 0–37)
Albumin: 4.5 g/dL (ref 3.5–5.2)
Alkaline Phosphatase: 44 U/L (ref 39–117)
Bilirubin, Direct: 0.1 mg/dL (ref 0.0–0.3)
Total Bilirubin: 0.5 mg/dL (ref 0.2–1.2)
Total Protein: 7 g/dL (ref 6.0–8.3)

## 2020-10-26 LAB — VITAMIN D 25 HYDROXY (VIT D DEFICIENCY, FRACTURES): VITD: 53.3 ng/mL (ref 30.00–100.00)

## 2020-10-26 LAB — TSH: TSH: 1.67 u[IU]/mL (ref 0.35–4.50)

## 2020-10-26 LAB — HEMOGLOBIN A1C: Hgb A1c MFr Bld: 5.5 % (ref 4.6–6.5)

## 2020-10-26 MED ORDER — MIDODRINE HCL 5 MG PO TABS: 5.0000 mg | ORAL_TABLET | Freq: Three times a day (TID) | ORAL | 1 refills | Status: DC

## 2020-10-26 MED ORDER — CARISOPRODOL 350 MG PO TABS: 350.0000 mg | ORAL_TABLET | Freq: Every evening | ORAL | 0 refills | Status: DC | PRN

## 2020-10-26 NOTE — Patient Instructions (Addendum)
Ok to try :Stop the amlodipine  Please take all new medication as prescribed - the midodrine 5 mg (which is taken to actively increase the blood pressure)  Please continue all other medications as before, and refills have been done if requested.  Please have the pharmacy call with any other refills you may need.  Please continue your efforts at being more active, low cholesterol diet, and weight control.  Please keep your appointments with your specialists as you may have planned  Please go to the LAB at the blood drawing area for the tests to be done  You will be contacted by phone if any changes need to be made immediately.  Otherwise, you will receive a letter about your results with an explanation, but please check with MyChart first.  Please remember to sign up for MyChart if you have not done so, as this will be important to you in the future with finding out test results, communicating by private email, and scheduling acute appointments online when needed.

## 2020-10-26 NOTE — Progress Notes (Signed)
Patient ID: CALLEE ROHRIG, female   DOB: 15-Jun-1941, 80 y.o.   MRN: [id number]         Chief Complaint:: yearly exam and Dizziness           HPI:  MARILIN KOFMAN is a 80 y.o. female here with husband with marked detailed discussion of evaluation for ongoing dizziness; was recently seen at [facility] for CP with negative evaluation and pain resolved since d/c Sep 14, 2020.  Stress testing neg by report.  With respect to dizziness, pt had onset symptoms since Dec 13 2019, MRI June 21 at [facility] neg, saw neurology Jn 28 Dr Krista Blue tx with memantine, saw ENT July who determined symptoms not vertigo, July 14 carotid doppler neg by report, July 19 tremors worse with CT head at ED - neg for acute, Sept 7 EEg per Dr Jannifer Franklin neurology neg, then seen per Dr Marta Antu [facility] audiology oct 11, then saw audiology vestibular eval per  Dr Damon Jul 27 2020 who advised PT but no help, and had neg oculomotor testing.  Husband now questions whether her postural lightheaded could be medication related.    Wt Readings from Last 3 Encounters:  10/26/20 138 lb (62.6 kg)  07/16/20 139 lb (63 kg)  05/08/20 133 lb (60.3 kg)   BP Readings from Last 3 Encounters:  10/26/20 118/78  07/16/20 (!) 148/82  05/08/20 132/82   Immunization History  Administered Date(s) Administered  . Influenza Split 05/24/2011  . Influenza Whole 06/09/2010  . Influenza, High Dose Seasonal PF 04/29/2016, 04/13/2017, 05/12/2018  . Influenza,inj,Quad PF,6+ Mos 04/08/2014, 05/08/2015  . Influenza-Unspecified 04/24/2013, 04/24/2018, 04/13/2020  . PFIZER(Purple Top)SARS-COV-2 Vaccination 08/15/2019, 08/29/2019, 04/20/2020  . Pneumococcal Conjugate-13 10/04/2013  . Pneumococcal Polysaccharide-23 07/25/2005  . Td 01/22/2002  . Tetanus 10/04/2013  . Zoster Recombinat (Shingrix) 05/31/2017  There are no preventive care reminders to display for this patient.    Past Medical History:  Diagnosis Date  . Allergic rhinitis, cause unspecified  01/29/2013  . Anemia 1948?  Marland Kitchen BACK PAIN, CHRONIC 02/09/2010  . Cancer ([place])    SKIN  . Cataract   . COLONIC POLYPS, HX OF 02/09/2010  . COMMON MIGRAINE 02/09/2010   neuritis in R side of neck & head  . DEGENERATIVE JOINT DISEASE 02/09/2010   hands , lumbar stenosis   . DEPRESSION 02/09/2010   pt. takes Cymbalta for pain issues   . GERD 02/09/2010  . HYPERLIPIDEMIA 02/09/2010  . HYPERTENSION 02/09/2010  . Lumbar disc disease   . Memory loss   . Neuromuscular disorder ([place])    neuritis in head & neck  . OCCIPITAL NEURALGIA 06/09/2010  . OSTEOPENIA 02/09/2010  . Pneumonia    "as an infant" (05/15/2012)  . Varicose vein of leg    BLE   Past Surgical History:  Procedure Laterality Date  . BREAST BIOPSY  1988   left; Neg  . CHOLECYSTECTOMY  ? 2009  . chymopapain injection  1986   Lumbar  . COLONOSCOPY    . DILATION AND CURETTAGE OF UTERUS  1960's  . HERNIA REPAIR  05/15/2012   ventral incisional   . LUMBAR FUSION  05/22/2013   L2  L3 decompression /fusion      Dr Lynann Bologna  . LUMBAR LAMINECTOMY  1986  . OVARIAN CYST REMOVAL  2006  . Stimulator placed   07/2014   placed 2 years ago for pain  . TRANSFORAMINAL LUMBAR INTERBODY FUSION (TLIF) WITH PEDICLE SCREW FIXATION 1 LEVEL Left 05/22/2013  Procedure: TRANSFORAMINAL LUMBAR INTERBODY FUSION (TLIF) WITH PEDICLE SCREW FIXATION 1 LEVEL;  Surgeon: Sinclair Ship, MD;  Location: [place];  Service: Orthopedics;  Laterality: Left;  Left sided lumbar 2-3 Transforaminal lumbar interbody fusion with instrumentation, allograft  . TUBAL LIGATION  1970's  . VARICOSE VEIN SURGERY  ~ 2008   bilaterally  . VENTRAL HERNIA REPAIR  05/15/2012   Procedure: LAPAROSCOPIC VENTRAL HERNIA;  Surgeon: Gayland Curry, MD,FACS;  Location: [place];  Service: General;  Laterality: N/A;  laparoscopic ventral incisional hernia repair with mesh    reports that she quit smoking about 49 years ago. Her smoking use included cigarettes. She has a 10.00 pack-year  smoking history. She has never used smokeless tobacco. She reports that she does not drink alcohol and does not use drugs. family history includes Breast cancer in her maternal grandmother; COPD in her mother; Cancer in her maternal grandmother, mother, and son; Colon cancer in her maternal aunt; Diabetes in her father; Lung cancer in her mother; Varicose Veins in her mother. Allergies  Allergen Reactions  . Cefuroxime     Might cause hives  . Cephalosporins     Might cause hives   . Latex Nausea Only  . Celexa [Citalopram Hydrobromide] Nausea Only  . Dexlansoprazole Anxiety    Sleeplessness heart racing headaches chiulls  . Lexapro [Escitalopram Oxalate] Nausea Only  . Penicillins Hives  . Sulfonamide Derivatives Hives  . Vancomycin Rash   Current Outpatient Medications on File Prior to Visit  Medication Sig Dispense Refill  . Biotin 1000 MCG tablet Take by mouth.    . calcium carbonate (OSCAL) 1500 (600 Ca) MG TABS tablet Take by mouth.    . Cholecalciferol (VITAMIN D3 PO) Take 2,000 Units by mouth daily.     Marland Kitchen donepezil (ARICEPT) 10 MG tablet Take 1 tablet (10 mg total) by mouth at bedtime. 30 tablet 11  . memantine (NAMENDA) 10 MG tablet Take 1 tablet (10 mg total) by mouth 2 (two) times daily. 180 tablet 4  . simvastatin (ZOCOR) 20 MG tablet Take 1 tablet by mouth every day at 6pm 90 tablet 3  . meclizine (ANTIVERT) 25 MG tablet Take 25 mg by mouth 3 (three) times daily as needed. (Patient not taking: Reported on 10/26/2020)     No current facility-administered medications on file prior to visit.        ROS:  All others reviewed and negative.  Objective        PE:  BP 118/78 (BP Location: Left Arm, Patient Position: Sitting, Cuff Size: Normal)   Pulse 72   Temp 98.2 F (36.8 C) (Oral)   Ht 5\' 5"  (1.651 m)   Wt 138 lb (62.6 kg)   SpO2 92%   BMI 22.96 kg/m                 Constitutional: Pt appears in NAD               HENT: Head: NCAT.                Right Ear:  External ear normal.                 Left Ear: External ear normal.                Eyes: . Pupils are equal, round, and reactive to light. Conjunctivae and EOM are normal               Nose:  without d/c or deformity               Neck: Neck supple. Gross normal ROM               Cardiovascular: Normal rate and regular rhythm.                 Pulmonary/Chest: Effort normal and breath sounds without rales or wheezing.                Abd:  Soft, NT, ND, + BS, no organomegaly               Neurological: Pt is alert. At baseline orientation, motor grossly intact               Skin: Skin is warm. No rashes, no other new lesions, LE edema - none               Psychiatric: Pt behavior is normal without agitation   Micro: none  Cardiac tracings I have personally interpreted today:  none  Pertinent Radiological findings (summarize): none   Lab Results  Component Value Date   WBC 5.9 10/26/2020   HGB 15.3 (H) 10/26/2020   HCT 45.5 10/26/2020   PLT 234.0 10/26/2020   GLUCOSE 82 10/26/2020   CHOL 164 10/26/2020   TRIG 79.0 10/26/2020   HDL 73.20 10/26/2020   LDLDIRECT 122.2 02/09/2010   LDLCALC 75 10/26/2020   ALT 14 10/26/2020   AST 18 10/26/2020   NA 138 10/26/2020   K 4.3 10/26/2020   CL 103 10/26/2020   CREATININE 0.82 10/26/2020   BUN 15 10/26/2020   CO2 29 10/26/2020   TSH 1.67 10/26/2020   INR 0.97 05/22/2013   HGBA1C 5.5 10/26/2020   Assessment/Plan:  VIOLET SEABURY is a 80 y.o. White or Caucasian [1] female with  has a past medical history of Allergic rhinitis, cause unspecified (01/29/2013), Anemia (1948?), BACK PAIN, CHRONIC (02/09/2010), Cancer ([place]), Cataract, COLONIC POLYPS, HX OF (02/09/2010), COMMON MIGRAINE (02/09/2010), DEGENERATIVE JOINT DISEASE (02/09/2010), DEPRESSION (02/09/2010), GERD (02/09/2010), HYPERLIPIDEMIA (02/09/2010), HYPERTENSION (02/09/2010), Lumbar disc disease, Memory loss, Neuromuscular disorder ([place]), OCCIPITAL NEURALGIA (06/09/2010), OSTEOPENIA  (02/09/2010), Pneumonia, and Varicose vein of leg.  Postural lightheadedness Etiology unclear, will try trial of midodrine  Mild cognitive impairment To continue memantine, and neuro f/u as planned  Hyperlipidemia Lab Results  Component Value Date   LDLCALC 75 10/26/2020   Stable, pt to continue current statin zocor 20   Essential hypertension Ok for trial off amlodiepine,  to f/u any worsening symptoms or concerns  Vitamin D deficiency Last vitamin D Lab Results  Component Value Date   VD25OH 53.30 10/26/2020   Stable, cont oral replacement   Followup: Return in about 22 days (around 11/17/2020).  Cathlean Cower, MD 10/31/2020 9:36 PM [facility]

## 2020-10-31 ENCOUNTER — Encounter: Payer: Self-pay | Admitting: Internal Medicine

## 2020-10-31 NOTE — Assessment & Plan Note (Signed)
Last vitamin D Lab Results  Component Value Date   VD25OH 53.30 10/26/2020   Stable, cont oral replacement

## 2020-10-31 NOTE — Assessment & Plan Note (Signed)
Ok for trial off amlodiepine,  to f/u any worsening symptoms or concerns

## 2020-10-31 NOTE — Assessment & Plan Note (Signed)
Etiology unclear, will try trial of midodrine

## 2020-10-31 NOTE — Assessment & Plan Note (Signed)
To continue memantine, and neuro f/u as planned

## 2020-10-31 NOTE — Assessment & Plan Note (Signed)
Lab Results  Component Value Date   LDLCALC 75 10/26/2020   Stable, pt to continue current statin zocor 20

## 2020-11-11 ENCOUNTER — Telehealth: Payer: Self-pay | Admitting: Internal Medicine

## 2020-11-11 NOTE — Telephone Encounter (Signed)
   Patient calling to report midodrine (PROAMATINE) 5 MG tablet is not helping her dizziness. She states she has been taking medications as instructed, no relief from dizziness   Please advise

## 2020-11-11 NOTE — Telephone Encounter (Signed)
Very sorry to hear that, we should be OK with stopping this medication.    Please continue to monitor the BP at home and call for average BP more than 140/90 in 1 -2 wks

## 2020-11-12 NOTE — Telephone Encounter (Signed)
Left voicemail for patient to call the office back.   

## 2020-11-12 NOTE — Telephone Encounter (Signed)
Patient notified that is should be OK with stopping this medication.    Please continue to monitor the BP at home and call for average BP more than 140/90 in 1 -2 wks

## 2020-11-17 ENCOUNTER — Other Ambulatory Visit: Payer: Self-pay

## 2020-11-17 ENCOUNTER — Ambulatory Visit (INDEPENDENT_AMBULATORY_CARE_PROVIDER_SITE_OTHER): Payer: Medicare Other | Admitting: Internal Medicine

## 2020-11-17 ENCOUNTER — Encounter: Payer: Self-pay | Admitting: Internal Medicine

## 2020-11-17 VITALS — BP 126/74 | HR 81 | Temp 98.1°F | Ht 65.0 in | Wt 141.0 lb

## 2020-11-17 DIAGNOSIS — E7800 Pure hypercholesterolemia, unspecified: Secondary | ICD-10-CM

## 2020-11-17 DIAGNOSIS — I10 Essential (primary) hypertension: Secondary | ICD-10-CM

## 2020-11-17 DIAGNOSIS — E559 Vitamin D deficiency, unspecified: Secondary | ICD-10-CM | POA: Diagnosis not present

## 2020-11-17 DIAGNOSIS — R42 Dizziness and giddiness: Secondary | ICD-10-CM

## 2020-11-17 NOTE — Progress Notes (Signed)
Patient ID: Crystal Herrera, female   DOB: May 28, 1941, 80 y.o.   MRN: [id number]         Chief Complaint:: yearly exam, chronic dizziness and dementia       HPI:  Crystal Herrera is a 80 y.o. female here with chronic postural lightheaded no change, Pt denies chest pain, increased sob or doe, wheezing, orthopnea, PND, increased LE swelling, palpitations,  Pt denies fever, wt loss, night sweats, loss of appetite, or other constitutional symptoms   Pt denies polydipsia, polyuria, or othre new focal neuro s/s.  Has tried aricept, but does not want further for some reason.  Dementia overall stable symptomatically, and not assoc with behavioral changes such as hallucinations, paranoia, or agitation.  No other new complaints   Wt Readings from Last 3 Encounters:  11/17/20 141 lb (64 kg)  10/26/20 138 lb (62.6 kg)  07/16/20 139 lb (63 kg)   BP Readings from Last 3 Encounters:  11/17/20 126/74  10/26/20 118/78  07/16/20 (!) 148/82   Immunization History  Administered Date(s) Administered  . Influenza Split 05/24/2011  . Influenza Whole 06/09/2010  . Influenza, High Dose Seasonal PF 04/29/2016, 04/13/2017, 05/12/2018  . Influenza,inj,Quad PF,6+ Mos 04/08/2014, 05/08/2015  . Influenza-Unspecified 04/24/2013, 04/24/2018, 04/13/2020  . PFIZER(Purple Top)SARS-COV-2 Vaccination 08/15/2019, 08/29/2019, 04/20/2020  . Pneumococcal Conjugate-13 10/04/2013  . Pneumococcal Polysaccharide-23 07/25/2005  . Td 01/22/2002  . Tetanus 10/04/2013  . Zoster Recombinat (Shingrix) 05/31/2017  There are no preventive care reminders to display for this patient.    Past Medical History:  Diagnosis Date  . Allergic rhinitis, cause unspecified 01/29/2013  . Anemia 1948?  Marland Kitchen BACK PAIN, CHRONIC 02/09/2010  . Cancer ([place])    SKIN  . Cataract   . COLONIC POLYPS, HX OF 02/09/2010  . COMMON MIGRAINE 02/09/2010   neuritis in R side of neck & head  . DEGENERATIVE JOINT DISEASE 02/09/2010   hands , lumbar stenosis    . DEPRESSION 02/09/2010   pt. takes Cymbalta for pain issues   . GERD 02/09/2010  . HYPERLIPIDEMIA 02/09/2010  . HYPERTENSION 02/09/2010  . Lumbar disc disease   . Memory loss   . Neuromuscular disorder ([place])    neuritis in head & neck  . OCCIPITAL NEURALGIA 06/09/2010  . OSTEOPENIA 02/09/2010  . Pneumonia    "as an infant" (05/15/2012)  . Varicose vein of leg    BLE   Past Surgical History:  Procedure Laterality Date  . BREAST BIOPSY  1988   left; Neg  . CHOLECYSTECTOMY  ? 2009  . chymopapain injection  1986   Lumbar  . COLONOSCOPY    . DILATION AND CURETTAGE OF UTERUS  1960's  . HERNIA REPAIR  05/15/2012   ventral incisional   . LUMBAR FUSION  05/22/2013   L2  L3 decompression /fusion      Dr Lynann Bologna  . LUMBAR LAMINECTOMY  1986  . OVARIAN CYST REMOVAL  2006  . Stimulator placed   07/2014   placed 2 years ago for pain  . TRANSFORAMINAL LUMBAR INTERBODY FUSION (TLIF) WITH PEDICLE SCREW FIXATION 1 LEVEL Left 05/22/2013   Procedure: TRANSFORAMINAL LUMBAR INTERBODY FUSION (TLIF) WITH PEDICLE SCREW FIXATION 1 LEVEL;  Surgeon: Sinclair Ship, MD;  Location: [facility];  Service: Orthopedics;  Laterality: Left;  Left sided lumbar 2-3 Transforaminal lumbar interbody fusion with instrumentation, allograft  . TUBAL LIGATION  1970's  . VARICOSE VEIN SURGERY  ~ 2008   bilaterally  . VENTRAL HERNIA REPAIR  05/15/2012  Procedure: LAPAROSCOPIC VENTRAL HERNIA;  Surgeon: Gayland Curry, MD,FACS;  Location: [facility];  Service: General;  Laterality: N/A;  laparoscopic ventral incisional hernia repair with mesh    reports that she quit smoking about 49 years ago. Her smoking use included cigarettes. She has a 10.00 pack-year smoking history. She has never used smokeless tobacco. She reports that she does not drink alcohol and does not use drugs. family history includes Breast cancer in her maternal grandmother; COPD in her mother; Cancer in her maternal grandmother, mother, and son; Colon cancer  in her maternal aunt; Diabetes in her father; Lung cancer in her mother; Varicose Veins in her mother. Allergies  Allergen Reactions  . Cefuroxime     Might cause hives  . Cephalosporins     Might cause hives   . Latex Nausea Only  . Celexa [Citalopram Hydrobromide] Nausea Only  . Dexlansoprazole Anxiety    Sleeplessness heart racing headaches chiulls  . Lexapro [Escitalopram Oxalate] Nausea Only  . Penicillins Hives  . Sulfonamide Derivatives Hives  . Vancomycin Rash   Current Outpatient Medications on File Prior to Visit  Medication Sig Dispense Refill  . Biotin 1000 MCG tablet Take by mouth.    . calcium carbonate (OSCAL) 1500 (600 Ca) MG TABS tablet Take by mouth.    . Cholecalciferol (VITAMIN D3 PO) Take 2,000 Units by mouth daily.     . memantine (NAMENDA) 10 MG tablet Take 1 tablet (10 mg total) by mouth 2 (two) times daily. 180 tablet 4  . simvastatin (ZOCOR) 20 MG tablet Take 1 tablet by mouth every day at 6pm 90 tablet 3   No current facility-administered medications on file prior to visit.        ROS:  All others reviewed and negative.  Objective        PE:  BP 126/74 (BP Location: Left Arm, Patient Position: Sitting, Cuff Size: Normal)   Pulse 81   Temp 98.1 F (36.7 C) (Oral)   Ht 5\' 5"  (1.651 m)   Wt 141 lb (64 kg)   SpO2 98%   BMI 23.46 kg/m                 Constitutional: Pt appears in NAD               HENT: Head: NCAT.                Right Ear: External ear normal.                 Left Ear: External ear normal.                Eyes: . Pupils are equal, round, and reactive to light. Conjunctivae and EOM are normal               Nose: without d/c or deformity               Neck: Neck supple. Gross normal ROM               Cardiovascular: Normal rate and regular rhythm.                 Pulmonary/Chest: Effort normal and breath sounds without rales or wheezing.                Abd:  Soft, NT, ND, + BS, no organomegaly               Neurological: Pt is  alert.  At baseline orientation, motor grossly intact               Skin: Skin is warm. No rashes, no other new lesions, LE edema - none               Psychiatric: Pt behavior is normal without agitation   Micro: none  Cardiac tracings I have personally interpreted today:  none  Pertinent Radiological findings (summarize): none   Lab Results  Component Value Date   WBC 5.9 10/26/2020   HGB 15.3 (H) 10/26/2020   HCT 45.5 10/26/2020   PLT 234.0 10/26/2020   GLUCOSE 82 10/26/2020   CHOL 164 10/26/2020   TRIG 79.0 10/26/2020   HDL 73.20 10/26/2020   LDLDIRECT 122.2 02/09/2010   LDLCALC 75 10/26/2020   ALT 14 10/26/2020   AST 18 10/26/2020   NA 138 10/26/2020   K 4.3 10/26/2020   CL 103 10/26/2020   CREATININE 0.82 10/26/2020   BUN 15 10/26/2020   CO2 29 10/26/2020   TSH 1.67 10/26/2020   INR 0.97 05/22/2013   HGBA1C 5.5 10/26/2020   Assessment/Plan:  Crystal Herrera is a 80 y.o. White or Caucasian [1] female with  has a past medical history of Allergic rhinitis, cause unspecified (01/29/2013), Anemia (1948?), BACK PAIN, CHRONIC (02/09/2010), Cancer ([place]), Cataract, COLONIC POLYPS, HX OF (02/09/2010), COMMON MIGRAINE (02/09/2010), DEGENERATIVE JOINT DISEASE (02/09/2010), DEPRESSION (02/09/2010), GERD (02/09/2010), HYPERLIPIDEMIA (02/09/2010), HYPERTENSION (02/09/2010), Lumbar disc disease, Memory loss, Neuromuscular disorder ([place]), OCCIPITAL NEURALGIA (06/09/2010), OSTEOPENIA (02/09/2010), Pneumonia, and Varicose vein of leg.  Vitamin D deficiency Last vitamin D Lab Results  Component Value Date   VD25OH 53.30 10/26/2020   Stable, cont oral replacement   Postural lightheadedness Etiology unclear, chronic persistent, not better with midodrine, ok to d/c  Hyperlipidemia Lab Results  Component Value Date   LDLCALC 75 10/26/2020   Stable, pt to continue current statin zocor   Essential hypertension BP Readings from Last 3 Encounters:  11/17/20 126/74  10/26/20 118/78   07/16/20 (!) 148/82   Stable, pt to continue medical treatment  - none for now   Followup: Return in about 6 months (around 05/19/2021).  Cathlean Cower, MD 11/23/2020 9:39 PM [facility]

## 2020-11-17 NOTE — Patient Instructions (Signed)
Ok to stop the midodrine  Ok to stop the aricept as you have  Please continue all other medications as before, and refills have been done if requested.  Please have the pharmacy call with any other refills you may need.  Please continue your efforts at being more active, low cholesterol diet, and weight control.  You are otherwise up to date with prevention measures today.  Please keep your appointments with your specialists as you may have planned  Please make an Appointment to return in 6 months, or sooner if needed

## 2020-11-23 ENCOUNTER — Encounter: Payer: Self-pay | Admitting: Internal Medicine

## 2020-11-23 NOTE — Assessment & Plan Note (Signed)
Etiology unclear, chronic persistent, not better with midodrine, ok to d/c

## 2020-11-23 NOTE — Assessment & Plan Note (Signed)
BP Readings from Last 3 Encounters:  11/17/20 126/74  10/26/20 118/78  07/16/20 (!) 148/82   Stable, pt to continue medical treatment  - none for now

## 2020-11-23 NOTE — Assessment & Plan Note (Signed)
Lab Results  Component Value Date   LDLCALC 75 10/26/2020   Stable, pt to continue current statin zocor

## 2020-11-23 NOTE — Assessment & Plan Note (Signed)
Last vitamin D Lab Results  Component Value Date   VD25OH 53.30 10/26/2020   Stable, cont oral replacement

## 2020-11-25 ENCOUNTER — Other Ambulatory Visit: Payer: Self-pay | Admitting: Internal Medicine

## 2020-11-25 NOTE — Telephone Encounter (Signed)
Please refill as per office routine med refill policy (all routine meds refilled for 3 mo or monthly per pt preference up to one year from last visit, then month to month grace period for 3 mo, then further med refills will have to be denied)  

## 2020-12-25 DIAGNOSIS — M84375A Stress fracture, left foot, initial encounter for fracture: Secondary | ICD-10-CM | POA: Diagnosis not present

## 2021-01-01 DIAGNOSIS — M84375D Stress fracture, left foot, subsequent encounter for fracture with routine healing: Secondary | ICD-10-CM | POA: Diagnosis not present

## 2021-01-04 ENCOUNTER — Telehealth: Payer: Self-pay | Admitting: Internal Medicine

## 2021-01-04 DIAGNOSIS — R42 Dizziness and giddiness: Secondary | ICD-10-CM

## 2021-01-04 DIAGNOSIS — G3184 Mild cognitive impairment, so stated: Secondary | ICD-10-CM

## 2021-01-04 NOTE — Telephone Encounter (Signed)
Crystal Herrera from [facility] called  Patient is having dizziness and will need an referral to be seen. Requesting the referral be faxed to them at [phone].   Please advise.

## 2021-01-05 ENCOUNTER — Telehealth: Payer: Self-pay | Admitting: Internal Medicine

## 2021-01-05 DIAGNOSIS — R42 Dizziness and giddiness: Secondary | ICD-10-CM

## 2021-01-05 DIAGNOSIS — R413 Other amnesia: Secondary | ICD-10-CM

## 2021-01-05 DIAGNOSIS — R269 Unspecified abnormalities of gait and mobility: Secondary | ICD-10-CM

## 2021-01-05 NOTE — Telephone Encounter (Signed)
Kim with [facility] called requesting that the PT order be faxed over to their office At [phone]  They are seeing the patient today

## 2021-01-05 NOTE — Telephone Encounter (Signed)
Faxed to [facility] at [facility] [phone]

## 2021-01-05 NOTE — Telephone Encounter (Signed)
I dont know how to address this - perhaps [facility] Kidney does?

## 2021-01-05 NOTE — Telephone Encounter (Signed)
[facility] notified of the referral.

## 2021-01-05 NOTE — Telephone Encounter (Signed)
Ok done

## 2021-01-05 NOTE — Telephone Encounter (Signed)
Ok referral done 

## 2021-01-06 DIAGNOSIS — K219 Gastro-esophageal reflux disease without esophagitis: Secondary | ICD-10-CM | POA: Diagnosis not present

## 2021-01-06 DIAGNOSIS — Z883 Allergy status to other anti-infective agents status: Secondary | ICD-10-CM | POA: Diagnosis not present

## 2021-01-06 DIAGNOSIS — R413 Other amnesia: Secondary | ICD-10-CM | POA: Diagnosis not present

## 2021-01-06 DIAGNOSIS — R410 Disorientation, unspecified: Secondary | ICD-10-CM | POA: Diagnosis not present

## 2021-01-06 DIAGNOSIS — M19041 Primary osteoarthritis, right hand: Secondary | ICD-10-CM | POA: Diagnosis not present

## 2021-01-06 DIAGNOSIS — Z880 Allergy status to penicillin: Secondary | ICD-10-CM | POA: Diagnosis not present

## 2021-01-06 DIAGNOSIS — Z87891 Personal history of nicotine dependence: Secondary | ICD-10-CM | POA: Diagnosis not present

## 2021-01-06 DIAGNOSIS — Z888 Allergy status to other drugs, medicaments and biological substances status: Secondary | ICD-10-CM | POA: Diagnosis not present

## 2021-01-06 DIAGNOSIS — Z79899 Other long term (current) drug therapy: Secondary | ICD-10-CM | POA: Diagnosis not present

## 2021-01-06 DIAGNOSIS — G459 Transient cerebral ischemic attack, unspecified: Secondary | ICD-10-CM | POA: Diagnosis not present

## 2021-01-06 DIAGNOSIS — R918 Other nonspecific abnormal finding of lung field: Secondary | ICD-10-CM | POA: Diagnosis not present

## 2021-01-06 DIAGNOSIS — M19042 Primary osteoarthritis, left hand: Secondary | ICD-10-CM | POA: Diagnosis not present

## 2021-01-06 DIAGNOSIS — Z882 Allergy status to sulfonamides status: Secondary | ICD-10-CM | POA: Diagnosis not present

## 2021-01-06 DIAGNOSIS — I10 Essential (primary) hypertension: Secondary | ICD-10-CM | POA: Diagnosis not present

## 2021-01-06 DIAGNOSIS — F0390 Unspecified dementia without behavioral disturbance: Secondary | ICD-10-CM | POA: Diagnosis not present

## 2021-01-06 DIAGNOSIS — I6521 Occlusion and stenosis of right carotid artery: Secondary | ICD-10-CM | POA: Diagnosis not present

## 2021-01-06 DIAGNOSIS — Z85828 Personal history of other malignant neoplasm of skin: Secondary | ICD-10-CM | POA: Diagnosis not present

## 2021-01-06 DIAGNOSIS — E7800 Pure hypercholesterolemia, unspecified: Secondary | ICD-10-CM | POA: Diagnosis not present

## 2021-01-07 DIAGNOSIS — R42 Dizziness and giddiness: Secondary | ICD-10-CM | POA: Diagnosis not present

## 2021-01-12 DIAGNOSIS — M84375D Stress fracture, left foot, subsequent encounter for fracture with routine healing: Secondary | ICD-10-CM | POA: Diagnosis not present

## 2021-01-12 DIAGNOSIS — R42 Dizziness and giddiness: Secondary | ICD-10-CM | POA: Diagnosis not present

## 2021-02-04 DIAGNOSIS — M84375D Stress fracture, left foot, subsequent encounter for fracture with routine healing: Secondary | ICD-10-CM | POA: Diagnosis not present

## 2021-02-11 ENCOUNTER — Telehealth: Payer: Self-pay | Admitting: Internal Medicine

## 2021-02-11 NOTE — Telephone Encounter (Signed)
LVM for pt to rtn my call to schedule AWV with [facility]. Please schedule AWV if pt calls the office. AWV was not done by Dr. Jenny Reichmann on 11/17/20

## 2021-02-15 ENCOUNTER — Telehealth: Payer: Self-pay | Admitting: Internal Medicine

## 2021-02-15 DIAGNOSIS — M84375D Stress fracture, left foot, subsequent encounter for fracture with routine healing: Secondary | ICD-10-CM | POA: Diagnosis not present

## 2021-02-15 NOTE — Telephone Encounter (Signed)
Patient's husband called and declined AWV.

## 2021-02-25 DIAGNOSIS — M84375D Stress fracture, left foot, subsequent encounter for fracture with routine healing: Secondary | ICD-10-CM | POA: Diagnosis not present

## 2021-03-12 DIAGNOSIS — M84375D Stress fracture, left foot, subsequent encounter for fracture with routine healing: Secondary | ICD-10-CM | POA: Diagnosis not present

## 2021-03-18 ENCOUNTER — Ambulatory Visit (INDEPENDENT_AMBULATORY_CARE_PROVIDER_SITE_OTHER): Payer: Medicare Other | Admitting: Neurology

## 2021-03-18 ENCOUNTER — Encounter: Payer: Self-pay | Admitting: Neurology

## 2021-03-18 VITALS — BP 123/70 | HR 76 | Ht 65.0 in | Wt 136.0 lb

## 2021-03-18 DIAGNOSIS — F0390 Unspecified dementia without behavioral disturbance: Secondary | ICD-10-CM | POA: Diagnosis not present

## 2021-03-18 DIAGNOSIS — G309 Alzheimer's disease, unspecified: Secondary | ICD-10-CM | POA: Insufficient documentation

## 2021-03-18 MED ORDER — MEMANTINE HCL 10 MG PO TABS: 10.0000 mg | ORAL_TABLET | Freq: Two times a day (BID) | ORAL | 4 refills | Status: DC

## 2021-03-18 MED ORDER — DONEPEZIL HCL 10 MG PO TABS: 10.0000 mg | ORAL_TABLET | Freq: Every day | ORAL | 4 refills | Status: DC

## 2021-03-18 NOTE — Progress Notes (Signed)
Chief Complaint  Patient presents with   Follow-up    New room w/ daughter, Eustaquio Maize. Feels memory has continued to decline. MoCA: 16/30.      ASSESSMENT AND PLAN   DENAH MOTT is a 80 y.o. female   Mild cognitive impairment  Mini-Mental Status Examination was 74/30 in June 2021, [place]   16 in August 2022.  MRI of the brain showed no acute abnormality, evidence of mild atrophy, from outside hospital in June 2021  Laboratory evaluation failed to demonstrate treatable etiology  Differentiation diagnosis include normal aging versus early stage of central nervous system degenerative disorder  I have advised her to continue moderate exercise  Increase Namenda to 10 mg twice a day, add on Aricept 10 mg daily  DIAGNOSTIC DATA (LABS, IMAGING, TESTING) - I reviewed patient records, labs, notes, testing and imaging myself where available.   MEDICAL HISTORY: Crystal Herrera is a 80 year old female, seen in request by her primary care physician Dr. Cathlean Cower for evaluation of memory loss, she is accompanied by her husband at today's clinical visit on January 20, 2020.  I reviewed and summarized the referring note. 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, reading [employer], she denies family history of dementia  Since early 2021, she noticed mild memory loss, word finding difficulties, has difficulty focusing on her yearly task of making yearly Christmas ornament for 8 grandchildren, she also complains of low back pain, has to break from her routine of walking regularly, she spent a lot of time sitting down, reading, denies difficulty keeping up with her reading  She no longer feel confident driving because she has trouble figuring out the direction,  MRI of the brain  without contrast in June 2021 from [facility] system, generalized atrophy, no acute abnormality  Laboratory evaluations, October 2020, normal vitamin D, CMP, CBC hemoglobin 15.3, TSH, B12, A1c, 5.4, fasting lipid panel, LDL 69  She had a history of intermittent dizziness in the past, often with improved quickly by repositioning maneuver, and vestibular exercise, this particular episode has been ongoing for 2 weeks, despite multiple attempts of Apley's maneuver, she continue complains of dizziness, it can happen in a sitting down position, especially when she gets up, she also complains of few years history of loud tinnitus, mild decreased hearing, has ENT appointment pending  UPDATE Jul 16 2020: Accompanied by her husband at today's visit, tolerating Namenda 5 mg twice a day, no significant change in her memory,  She was also seen by neurologist Dr. Jannifer Franklin in August 2021, EEG showed mild slowing, no evidence of epileptiform discharge  Ultrasound of carotid artery showed no hemodynamic stenosis  CT head without contrast showed no acute abnormalities  Laboratory evaluation in December 2021, vitamin D was 45, CMP, creatinine of 0.73, normal CBC hemoglobin of 14.9, LDL of 88  She was also seen by ENT, no clear etiology found, because her frequent complains of lightheadedness in a sitting or standing position, hypertension agent hydrochlorothiazide was replaced with very low-dose Norvasc 2.5 mg daily, no significant difference noted, she exercise regularly, swimming  UPDATE March 18 2021: She is here with her daughter Eustaquio Maize, continue have slow decline memory loss, tolerating memantine 10 mg twice a day well, try to walk at neighborhood daily, lives  with her husband, has good appetite, sleep well, no hallucinations   PHYSICAL EXAM:   Vitals:   03/18/21 0731  BP: 123/70  Pulse: 76  Weight: 136 lb (61.7 kg)  Height: '5\' 5"'$  (1.651 m)   Not recorded     Body mass index is 22.63  kg/m.  PHYSICAL EXAMNIATION:  Gen: NAD, conversant, well nourised, well groomed                 NEUROLOGICAL EXAM:  MENTAL STATUS: Speech:    Speech is normal; fluent and spontaneous with normal comprehension.  Cognition: Montreal Cognitive Assessment  03/18/2021  Visuospatial/ Executive (0/5) 2  Naming (0/3) 2  Attention: Read list of digits (0/2) 2  Attention: Read list of letters (0/1) 1  Attention: Serial 7 subtraction starting at 100 (0/3) 0  Language: Repeat phrase (0/2) 2  Language : Fluency (0/1) 1  Abstraction (0/2) 1  Delayed Recall (0/5) 0  Orientation (0/6) 5  Total 16  Adjusted Score (based on education) 16      CRANIAL NERVES: CN II: Visual fields are full to confrontation. Pupils are round equal and briskly reactive to light. CN III, IV, VI: extraocular movement are normal. No ptosis. CN V: Facial sensation is intact to light touch CN VII: Face is symmetric with normal eye closure  CN VIII: Hearing is normal to causal conversation. CN IX, X: Phonation is normal. CN XI: Head turning and shoulder shrug are intact  MOTOR: There is no pronator drift of out-stretched arms. Muscle bulk and tone are normal. Muscle strength is normal.  REFLEXES: Reflexes are 2+ and symmetric at the biceps, triceps, knees, and ankles. Plantar responses are flexor.  SENSORY: Intact to light touch, pinprick and vibratory sensation are intact in fingers and toes.  COORDINATION: There is no trunk or limb dysmetria noted.  GAIT/STANCE: Posture is normal. Gait is steady with normal steps, base, arm swing, and turning. Heel and toe walking are normal. Tandem gait is normal.  Romberg is absent.  REVIEW OF SYSTEMS:  Full 14 system review of systems performed and notable only for as above All other review of systems were negative.   ALLERGIES: Allergies  Allergen Reactions   Cefuroxime     Might cause hives   Cephalosporins     Might cause hives    Latex Nausea Only    Celexa [Citalopram Hydrobromide] Nausea Only   Dexlansoprazole Anxiety    Sleeplessness heart racing headaches chiulls   Lexapro [Escitalopram Oxalate] Nausea Only   Penicillins Hives   Sulfonamide Derivatives Hives   Vancomycin Rash    HOME MEDICATIONS: Current Outpatient Medications  Medication Sig Dispense Refill   Biotin 1000 MCG tablet Take by mouth.     calcium carbonate (OSCAL) 1500 (600 Ca) MG TABS tablet Take by mouth.     Cholecalciferol (VITAMIN D3 PO) Take 2,000 Units by mouth daily.      memantine (NAMENDA) 10 MG tablet Take 1 tablet (10 mg total) by mouth 2 (two) times daily. 180 tablet 4   simvastatin (ZOCOR) 20 MG tablet Take 1 tablet by mouth every day at 6pm 90 tablet 3   No current facility-administered medications for this visit.    PAST MEDICAL HISTORY: Past Medical History:  Diagnosis Date   Allergic rhinitis, cause unspecified 01/29/2013   Anemia 1948?   BACK PAIN, CHRONIC 02/09/2010   Cancer ([place])    SKIN   Cataract    COLONIC POLYPS, HX OF 02/09/2010   COMMON  MIGRAINE 02/09/2010   neuritis in R side of neck & head   DEGENERATIVE JOINT DISEASE 02/09/2010   hands , lumbar stenosis    DEPRESSION 02/09/2010   pt. takes Cymbalta for pain issues    GERD 02/09/2010   HYPERLIPIDEMIA 02/09/2010   HYPERTENSION 02/09/2010   Lumbar disc disease    Memory loss    Neuromuscular disorder ([place])    neuritis in head & neck   OCCIPITAL NEURALGIA 06/09/2010   OSTEOPENIA 02/09/2010   Pneumonia    "as an infant" (05/15/2012)   Varicose vein of leg    BLE    PAST SURGICAL HISTORY: Past Surgical History:  Procedure Laterality Date   BREAST BIOPSY  1988   left; Neg   CHOLECYSTECTOMY  ? 2009   chymopapain injection  1986   Lumbar   COLONOSCOPY     DILATION AND CURETTAGE OF UTERUS  1960's   HERNIA REPAIR  05/15/2012   ventral incisional    LUMBAR FUSION  05/22/2013   L2  L3 decompression /fusion      Dr Lynann Bologna   LUMBAR LAMINECTOMY  1986   OVARIAN CYST REMOVAL   2006   Stimulator placed   07/2014   placed 2 years ago for pain   TRANSFORAMINAL LUMBAR INTERBODY FUSION (TLIF) WITH PEDICLE SCREW FIXATION 1 LEVEL Left 05/22/2013   Procedure: TRANSFORAMINAL LUMBAR INTERBODY FUSION (TLIF) WITH PEDICLE SCREW FIXATION 1 LEVEL;  Surgeon: Sinclair Ship, MD;  Location: [facility];  Service: Orthopedics;  Laterality: Left;  Left sided lumbar 2-3 Transforaminal lumbar interbody fusion with instrumentation, allograft   TUBAL LIGATION  1970's   VARICOSE VEIN SURGERY  ~ 2008   bilaterally   VENTRAL HERNIA REPAIR  05/15/2012   Procedure: LAPAROSCOPIC VENTRAL HERNIA;  Surgeon: Gayland Curry, MD,FACS;  Location: [facility];  Service: General;  Laterality: N/A;  laparoscopic ventral incisional hernia repair with mesh    FAMILY HISTORY: Family History  Problem Relation Age of Onset   Lung cancer Mother    Cancer Mother        Lung   COPD Mother    Varicose Veins Mother    Diabetes Father    Cancer Son        Melanoma   Breast cancer Maternal Grandmother    Cancer Maternal Grandmother        breast   Colon cancer Maternal Aunt     SOCIAL HISTORY: Social History   Socioeconomic History   Marital status: Married    Spouse name: Not on file   Number of children: 3   Years of education: 12   Highest education level: High school graduate  Occupational History   Occupation: Retired  Tobacco Use   Smoking status: Former    Packs/day: 1.00    Years: 10.00    Pack years: 10.00    Types: Cigarettes    Quit date: 05/11/1971    Years since quitting: 49.8   Smokeless tobacco: Never  Vaping Use   Vaping Use: Never used  Substance and Sexual Activity   Alcohol use: No    Alcohol/week: 3.0 standard drinks    Types: 3 Glasses of wine per week    Comment: glass of wine a few times each week   Drug use: No   Sexual activity: Not Currently  Other Topics Concern   Not on file  Social History Narrative   Lives at home with husband.   Right-handed.    Occasional cup of tea.  Social Determinants of Health   Financial Resource Strain: Not on file  Food Insecurity: Not on file  Transportation Needs: Not on file  Physical Activity: Not on file  Stress: Not on file  Social Connections: Not on file  Intimate Partner Violence: Not on file      Marcial Pacas, M.D. Ph.D.  [facility] [address] [place], [place] [address] Ph: [phone] Fax: [phone]  CC:  Biagio Borg, MD [place],  [place] [phone]  Biagio Borg, MD

## 2021-05-03 DIAGNOSIS — Z23 Encounter for immunization: Secondary | ICD-10-CM | POA: Diagnosis not present

## 2021-05-17 DIAGNOSIS — G5762 Lesion of plantar nerve, left lower limb: Secondary | ICD-10-CM | POA: Diagnosis not present

## 2021-05-17 DIAGNOSIS — M21612 Bunion of left foot: Secondary | ICD-10-CM | POA: Diagnosis not present

## 2021-05-17 DIAGNOSIS — M79672 Pain in left foot: Secondary | ICD-10-CM | POA: Diagnosis not present

## 2021-05-17 DIAGNOSIS — M7752 Other enthesopathy of left foot: Secondary | ICD-10-CM | POA: Diagnosis not present

## 2021-05-19 ENCOUNTER — Ambulatory Visit (INDEPENDENT_AMBULATORY_CARE_PROVIDER_SITE_OTHER): Payer: Medicare Other | Admitting: Internal Medicine

## 2021-05-19 ENCOUNTER — Encounter: Payer: Self-pay | Admitting: Internal Medicine

## 2021-05-19 ENCOUNTER — Other Ambulatory Visit: Payer: Self-pay

## 2021-05-19 VITALS — BP 118/68 | HR 67 | Ht 65.0 in | Wt 131.0 lb

## 2021-05-19 DIAGNOSIS — I10 Essential (primary) hypertension: Secondary | ICD-10-CM

## 2021-05-19 DIAGNOSIS — E559 Vitamin D deficiency, unspecified: Secondary | ICD-10-CM

## 2021-05-19 DIAGNOSIS — E7800 Pure hypercholesterolemia, unspecified: Secondary | ICD-10-CM

## 2021-05-19 DIAGNOSIS — G3184 Mild cognitive impairment, so stated: Secondary | ICD-10-CM

## 2021-05-19 NOTE — Progress Notes (Signed)
Patient ID: Crystal Herrera, female   DOB: [birth year], 80 y.o.   MRN: [id number]        Chief Complaint: follow up HTN, HLD and hyperglycemia , vit d deficiency, MCI       HPI:  Crystal Herrera is a 80 y.o. female here overall doing ok,  Pt denies chest pain, increased sob or doe, wheezing, orthopnea, PND, increased LE swelling, palpitations, dizziness or syncope.   Pt denies polydipsia, polyuria, or new focal neuro s/s.   Pt denies fever, wt loss, night sweats, loss of appetite, or other constitutional symptoms  Dementia overall stable symptomatically, and not assoc with behavioral changes such as hallucinations, paranoia, or agitation.   Dizziness resolved with Pt and then daily excercises for vertigo Wt Readings from Last 3 Encounters:  05/19/21 131 lb (59.4 kg)  03/18/21 136 lb (61.7 kg)  11/17/20 141 lb (64 kg)   BP Readings from Last 3 Encounters:  05/19/21 118/68  03/18/21 123/70  11/17/20 126/74         Past Medical History:  Diagnosis Date   Allergic rhinitis, cause unspecified 01/29/2013   Anemia 1948?   BACK PAIN, CHRONIC 02/09/2010   Cancer ([place])    SKIN   Cataract    COLONIC POLYPS, HX OF 02/09/2010   COMMON MIGRAINE 02/09/2010   neuritis in R side of neck & head   DEGENERATIVE JOINT DISEASE 02/09/2010   hands , lumbar stenosis    DEPRESSION 02/09/2010   pt. takes Cymbalta for pain issues    GERD 02/09/2010   HYPERLIPIDEMIA 02/09/2010   HYPERTENSION 02/09/2010   Lumbar disc disease    Memory loss    Neuromuscular disorder ([place])    neuritis in head & neck   OCCIPITAL NEURALGIA 06/09/2010   OSTEOPENIA 02/09/2010   Pneumonia    "as an infant" (05/15/2012)   Varicose vein of leg    BLE   Past Surgical History:  Procedure Laterality Date   BREAST BIOPSY  1988   left; Neg   CHOLECYSTECTOMY  ? 2009   chymopapain injection  1986   Lumbar   COLONOSCOPY     DILATION AND CURETTAGE OF UTERUS  1960's   HERNIA REPAIR  05/15/2012   ventral incisional    LUMBAR FUSION   05/22/2013   L2  L3 decompression /fusion      Dr Lynann Bologna   LUMBAR LAMINECTOMY  1986   OVARIAN CYST REMOVAL  2006   Stimulator placed   07/2014   placed 2 years ago for pain   TRANSFORAMINAL LUMBAR INTERBODY FUSION (TLIF) WITH PEDICLE SCREW FIXATION 1 LEVEL Left 05/22/2013   Procedure: TRANSFORAMINAL LUMBAR INTERBODY FUSION (TLIF) WITH PEDICLE SCREW FIXATION 1 LEVEL;  Surgeon: Sinclair Ship, MD;  Location: [facility];  Service: Orthopedics;  Laterality: Left;  Left sided lumbar 2-3 Transforaminal lumbar interbody fusion with instrumentation, allograft   TUBAL LIGATION  1970's   VARICOSE VEIN SURGERY  ~ 2008   bilaterally   VENTRAL HERNIA REPAIR  05/15/2012   Procedure: LAPAROSCOPIC VENTRAL HERNIA;  Surgeon: Gayland Curry, MD,FACS;  Location: [facility];  Service: General;  Laterality: N/A;  laparoscopic ventral incisional hernia repair with mesh    reports that she quit smoking about 50 years ago. Her smoking use included cigarettes. She has a 10.00 pack-year smoking history. She has never used smokeless tobacco. She reports that she does not drink alcohol and does not use drugs. family history includes Breast cancer in her maternal grandmother; COPD  in her mother; Cancer in her maternal grandmother, mother, and son; Colon cancer in her maternal aunt; Diabetes in her father; Lung cancer in her mother; Varicose Veins in her mother. Allergies  Allergen Reactions   Cefuroxime     Might cause hives   Cephalosporins     Might cause hives    Latex Nausea Only   Celexa [Citalopram Hydrobromide] Nausea Only   Dexlansoprazole Anxiety    Sleeplessness heart racing headaches chiulls   Lexapro [Escitalopram Oxalate] Nausea Only   Penicillins Hives   Sulfonamide Derivatives Hives   Vancomycin Rash   Current Outpatient Medications on File Prior to Visit  Medication Sig Dispense Refill   Biotin 1000 MCG tablet Take by mouth.     calcium carbonate (OSCAL) 1500 (600 Ca) MG TABS tablet Take by mouth.      Cholecalciferol (VITAMIN D3 PO) Take 2,000 Units by mouth daily.      donepezil (ARICEPT) 10 MG tablet Take 1 tablet (10 mg total) by mouth at bedtime. 90 tablet 4   memantine (NAMENDA) 10 MG tablet Take 1 tablet (10 mg total) by mouth 2 (two) times daily. 180 tablet 4   simvastatin (ZOCOR) 20 MG tablet Take 1 tablet by mouth every day at 6pm 90 tablet 3   No current facility-administered medications on file prior to visit.        ROS:  All others reviewed and negative.  Objective        PE:  BP 118/68 (BP Location: Left Arm, Patient Position: Sitting, Cuff Size: Normal)   Pulse 67   Ht 5\' 5"  (1.651 m)   Wt 131 lb (59.4 kg)   SpO2 100%   BMI 21.80 kg/m                 Constitutional: Pt appears in NAD               HENT: Head: NCAT.                Right Ear: External ear normal.                 Left Ear: External ear normal.                Eyes: . Pupils are equal, round, and reactive to light. Conjunctivae and EOM are normal               Nose: without d/c or deformity               Neck: Neck supple. Gross normal ROM               Cardiovascular: Normal rate and regular rhythm.                 Pulmonary/Chest: Effort normal and breath sounds without rales or wheezing.                Abd:  Soft, NT, ND, + BS, no organomegaly               Neurological: Pt is alert. At baseline orientation, motor grossly intact               Skin: Skin is warm. No rashes, no other new lesions, LE edema - none               Psychiatric: Pt behavior is normal without agitation   Micro: none  Cardiac tracings I have personally interpreted today:  none  Pertinent Radiological findings (summarize): none   Lab Results  Component Value Date   WBC 5.9 10/26/2020   HGB 15.3 (H) 10/26/2020   HCT 45.5 10/26/2020   PLT 234.0 10/26/2020   GLUCOSE 82 10/26/2020   CHOL 164 10/26/2020   TRIG 79.0 10/26/2020   HDL 73.20 10/26/2020   LDLDIRECT 122.2 02/09/2010   LDLCALC 75 10/26/2020   ALT 14  10/26/2020   AST 18 10/26/2020   NA 138 10/26/2020   K 4.3 10/26/2020   CL 103 10/26/2020   CREATININE 0.82 10/26/2020   BUN 15 10/26/2020   CO2 29 10/26/2020   TSH 1.67 10/26/2020   INR 0.97 05/22/2013   HGBA1C 5.5 10/26/2020   Assessment/Plan:  Crystal Herrera is a 80 y.o. White or Caucasian [1] female with  has a past medical history of Allergic rhinitis, cause unspecified (01/29/2013), Anemia (1948?), BACK PAIN, CHRONIC (02/09/2010), Cancer ([place]), Cataract, COLONIC POLYPS, HX OF (02/09/2010), COMMON MIGRAINE (02/09/2010), DEGENERATIVE JOINT DISEASE (02/09/2010), DEPRESSION (02/09/2010), GERD (02/09/2010), HYPERLIPIDEMIA (02/09/2010), HYPERTENSION (02/09/2010), Lumbar disc disease, Memory loss, Neuromuscular disorder ([place]), OCCIPITAL NEURALGIA (06/09/2010), OSTEOPENIA (02/09/2010), Pneumonia, and Varicose vein of leg.  Vitamin D deficiency Last vitamin D Lab Results  Component Value Date   VD25OH 53.30 10/26/2020   Stable, cont oral replacement   Hyperlipidemia Lab Results  Component Value Date   LDLCALC 75 10/26/2020   Mild uncontrolled, goal ldl < 70, pt to continue current statin zocor 20 as declines change to 40   Essential hypertension BP Readings from Last 3 Encounters:  05/19/21 118/68  03/18/21 123/70  11/17/20 126/74   Stable, pt to continue medical treatment - diet, low salt, wt control   Mild cognitive impairment Stable, cont aricept, namenda  Followup: No follow-ups on file.  Cathlean Cower, MD 05/25/2021 10:22 PM [facility]

## 2021-05-19 NOTE — Patient Instructions (Addendum)
Please remember to check with [facility] about the second Shingrix shot  Please continue all other medications as before, and refills have been done if requested.  Please have the pharmacy call with any other refills you may need.  Please continue your efforts at being more active, low cholesterol diet, and weight control.  You are otherwise up to date with prevention measures today.  Please keep your appointments with your specialists as you may have planned  We can hold on further labs testing today  Please make an Appointment to return in 6 months, or sooner if needed

## 2021-05-25 ENCOUNTER — Encounter: Payer: Self-pay | Admitting: Internal Medicine

## 2021-05-25 NOTE — Assessment & Plan Note (Signed)
Lab Results  Component Value Date   LDLCALC 75 10/26/2020   Mild uncontrolled, goal ldl < 70, pt to continue current statin zocor 20 as declines change to 40

## 2021-05-25 NOTE — Assessment & Plan Note (Signed)
Stable, cont aricept, namenda

## 2021-05-25 NOTE — Assessment & Plan Note (Signed)
BP Readings from Last 3 Encounters:  05/19/21 118/68  03/18/21 123/70  11/17/20 126/74   Stable, pt to continue medical treatment - diet, low salt, wt control

## 2021-05-25 NOTE — Assessment & Plan Note (Signed)
Last vitamin D Lab Results  Component Value Date   VD25OH 53.30 10/26/2020   Stable, cont oral replacement

## 2021-05-26 DIAGNOSIS — Z85828 Personal history of other malignant neoplasm of skin: Secondary | ICD-10-CM | POA: Diagnosis not present

## 2021-05-26 DIAGNOSIS — L821 Other seborrheic keratosis: Secondary | ICD-10-CM | POA: Diagnosis not present

## 2021-05-26 DIAGNOSIS — D1801 Hemangioma of skin and subcutaneous tissue: Secondary | ICD-10-CM | POA: Diagnosis not present

## 2021-09-07 ENCOUNTER — Telehealth: Payer: Self-pay | Admitting: Neurology

## 2021-09-07 MED ORDER — DONEPEZIL HCL 10 MG PO TABS: 10.0000 mg | ORAL_TABLET | Freq: Every day | ORAL | 3 refills | Status: DC

## 2021-09-07 MED ORDER — MEMANTINE HCL 10 MG PO TABS: 10.0000 mg | ORAL_TABLET | Freq: Two times a day (BID) | ORAL | 3 refills | Status: DC

## 2021-09-07 NOTE — Telephone Encounter (Signed)
Pt's husband, Chyrl Civatte request refill for memantine (NAMENDA) 10 MG tablet at [facility]  [phone]  New insurance info: [employer] Customer ID: [id number] Group ID: [id number] Constable: [id number]

## 2021-09-07 NOTE — Telephone Encounter (Signed)
I called the patient's husband. He needs memantine sent to [facility] to be filled now and donepezil sent to place on file (she has plenty of supply at home). Refills sent to mail order pharmacy.

## 2021-11-08 ENCOUNTER — Encounter: Payer: Self-pay | Admitting: Neurology

## 2021-11-08 ENCOUNTER — Ambulatory Visit (INDEPENDENT_AMBULATORY_CARE_PROVIDER_SITE_OTHER): Payer: Medicare Other | Admitting: Neurology

## 2021-11-08 VITALS — BP 135/78 | HR 71 | Ht 65.0 in | Wt 132.2 lb

## 2021-11-08 DIAGNOSIS — F02A0 Dementia in other diseases classified elsewhere, mild, without behavioral disturbance, psychotic disturbance, mood disturbance, and anxiety: Secondary | ICD-10-CM | POA: Diagnosis not present

## 2021-11-08 DIAGNOSIS — G301 Alzheimer's disease with late onset: Secondary | ICD-10-CM | POA: Diagnosis not present

## 2021-11-08 MED ORDER — MEMANTINE HCL 10 MG PO TABS: 10.0000 mg | ORAL_TABLET | Freq: Two times a day (BID) | ORAL | 3 refills | Status: DC

## 2021-11-08 MED ORDER — DONEPEZIL HCL 10 MG PO TABS
10.0000 mg | ORAL_TABLET | Freq: Every day | ORAL | 3 refills | Status: DC
Start: 2021-11-08 — End: 2022-11-21

## 2021-11-08 NOTE — Progress Notes (Signed)
? ?Chief Complaint  ?Patient presents with  ? Follow-up  ?  Rm 15. Accompanied by husband, Crystal Herrera. ?Memory f/u. States her husband helps her a lot. [place] 14/30.  ? ? ? ? ?ASSESSMENT AND PLAN ?  ?Crystal Herrera is a 81 y.o. female   ?Dementia without agitation ? Mini-Mental Status Examination was 29/30 in June 2021, [place]   16 in August 2022.  14/30 in April 2023 ? MRI of the brain showed no acute abnormality, evidence of mild atrophy (from [facility] in June 2021) ? Laboratory evaluation failed to demonstrate treatable etiology ? Most consistent with central nervous system degenerative disorder, likely Alzheimer's disease ? Encouraged her her  moderate exercise ? Keep Namenda to 10 mg twice a day,  Aricept 10 mg daily ? Continue follow-up with her primary care physician, return to clinic for new issues ? ?DIAGNOSTIC DATA (LABS, IMAGING, TESTING) ?- I reviewed patient records, labs, notes, testing and imaging myself where available. ? ? ?MEDICAL HISTORY: ?Crystal Herrera is a 81 year old female, seen in request by her primary care physician Dr. Cathlean Cower for evaluation of memory loss, she is accompanied by her husband at today's clinical visit on January 20, 2020. ? ?I reviewed and summarized the referring note. ?She had a past medical history of hypertension, taking amlodipine 2.5 mg daily ?Hyperlipidemia okay, taking Zocor 20 mg daily ? 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 ?Since early 2021, she noticed mild memory loss, word finding difficulties, has difficulty focusing on her yearly task of making yearly Christmas ornament for 8 grandchildren, she also complains of low back pain, has to break from her routine of walking regularly, she spent a lot of time sitting down, reading, denies difficulty keeping up with her reading ? ?She no longer feel  confident driving because she has trouble figuring out the direction, ? ?MRI of the brain without contrast in June 2021 from [facility] system, generalized atrophy, no acute abnormality ? ?Laboratory evaluations, October 2020, normal vitamin D, CMP, CBC hemoglobin 15.3, TSH, B12, A1c, 5.4, fasting lipid panel, LDL 69 ? ?She had a history of intermittent dizziness in the past, often with improved quickly by repositioning maneuver, and vestibular exercise, this particular episode has been ongoing for 2 weeks, despite multiple attempts of Apley's maneuver, she continue complains of dizziness, it can happen in a sitting down position, especially when she gets up, she also complains of few years history of loud tinnitus, mild decreased hearing, has ENT appointment pending ? ?UPDATE Jul 16 2020: ?Accompanied by her husband at today's visit, tolerating Namenda 5 mg twice a day, no significant change in her memory, ? ?She was also seen by neurologist Dr. Jannifer Franklin in August 2021, EEG showed mild slowing, no evidence of epileptiform discharge ? ?Ultrasound of carotid artery showed no hemodynamic stenosis ? ?CT head without contrast showed no acute abnormalities ? ?Laboratory evaluation in December 2021, vitamin D was 45, CMP, creatinine of 0.73, normal CBC hemoglobin of 14.9, LDL of 88 ? ?She was also seen by ENT, no clear etiology found, because her frequent complains of lightheadedness in a sitting or standing position, hypertension agent hydrochlorothiazide was replaced with very low-dose Norvasc 2.5 mg daily, no significant difference noted, she exercise regularly, swimming ? ?UPDATE March 18 2021: ?She is here with her daughter Crystal Herrera, continue have slow decline  memory loss, tolerating memantine 10 mg twice a day well, try to walk at neighborhood daily, lives with her husband, has good appetite, sleep well, no hallucinations ? ?UPDATE November 08 2021: ?She is accompanied by her husband at today's visit. She likes to read, walk  occasionally,  she takes of her medications, good appetite, sleeps well, tolerating aricept '10mg'$  daily and namenda '10mg'$  bid, no gait abnormality, she goes to [employer] few month every year,  ? ?PHYSICAL EXAM: ?  ?Vitals:  ? 11/08/21 0849  ?BP: 135/78  ?Pulse: 71  ?Weight: 132 lb 3.2 oz (60 kg)  ?Height: '5\' 5"'$  (1.651 m)  ? ?Not recorded ?  ? ? ?Body mass index is 22 kg/m?. ? ?PHYSICAL EXAMNIATION: ? ?Gen: NAD, conversant, well nourised, well groomed                ? ?NEUROLOGICAL EXAM: ? ?MENTAL STATUS: ?Speech: ?   Speech is normal; fluent and spontaneous with normal comprehension.  ?Cognition: ? ?  11/08/2021  ?  8:54 AM 03/18/2021  ?  7:36 AM  ?Crystal Herrera Cognitive Assessment   ?Visuospatial/ Executive (0/5) 2 2  ?Naming (0/3) 3 2  ?Attention: Read list of digits (0/2) 2 2  ?Attention: Read list of letters (0/1) 1 1  ?Attention: Serial 7 subtraction starting at 100 (0/3) 0 0  ?Language: Repeat phrase (0/2) 1 2  ?Language : Fluency (0/1) 1 1  ?Abstraction (0/2) 1 1  ?Delayed Recall (0/5) 0 0  ?Orientation (0/6) 3 5  ?Total 14 16  ?Adjusted Score (based on education) 14 16  ?  ?  ?CRANIAL NERVES: ?CN II: Visual fields are full to confrontation. Pupils are round equal and briskly reactive to light. ?CN III, IV, VI: extraocular movement are normal. No ptosis. ?CN V: Facial sensation is intact to light touch ?CN VII: Face is symmetric with normal eye closure  ?CN VIII: Hearing is normal to causal conversation. ?CN IX, X: Phonation is normal. ?CN XI: Head turning and shoulder shrug are intact ? ?MOTOR: Normal muscle strength ? ?REFLEXES: ?Reflexes are hypoactive and symmetric at the biceps, triceps, knees, and ankles.  ? ?SENSORY: ?Intact to light touch, ? ?COORDINATION: ?There is no trunk or limb dysmetria noted. ? ?GAIT/STANCE: Gait is normal ? ?REVIEW OF SYSTEMS:  ?Full 14 system review of systems performed and notable only for as above ?All other review of systems were negative. ? ? ?ALLERGIES: ?Allergies  ?Allergen  Reactions  ? Cefuroxime   ?  Might cause hives  ? Cephalosporins   ?  Might cause hives ?  ? Latex Nausea Only  ? Celexa [Citalopram Hydrobromide] Nausea Only  ? Dexlansoprazole Anxiety  ?  Sleeplessness heart racing headaches chiulls  ? Lexapro [Escitalopram Oxalate] Nausea Only  ? Penicillins Hives  ? Sulfonamide Derivatives Hives  ? Vancomycin Rash  ? ? ?HOME MEDICATIONS: ?Current Outpatient Medications  ?Medication Sig Dispense Refill  ? Biotin 1000 MCG tablet Take by mouth.    ? calcium carbonate (OSCAL) 1500 (600 Ca) MG TABS tablet Take by mouth.    ? Cholecalciferol (VITAMIN D3 PO) Take 2,000 Units by mouth daily.     ? donepezil (ARICEPT) 10 MG tablet Take 1 tablet (10 mg total) by mouth at bedtime. 90 tablet 3  ? memantine (NAMENDA) 10 MG tablet Take 1 tablet (10 mg total) by mouth 2 (two) times daily. 180 tablet 3  ? simvastatin (ZOCOR) 20 MG tablet Take 1 tablet by mouth every day at 6pm  90 tablet 3  ? ?No current facility-administered medications for this visit.  ? ? ?PAST MEDICAL HISTORY: ?Past Medical History:  ?Diagnosis Date  ? Allergic rhinitis, cause unspecified 01/29/2013  ? Anemia 1948?  ? BACK PAIN, CHRONIC 02/09/2010  ? Cancer [facility])   ? SKIN  ? Cataract   ? COLONIC POLYPS, HX OF 02/09/2010  ? COMMON MIGRAINE 02/09/2010  ? neuritis in R side of neck & head  ? DEGENERATIVE JOINT DISEASE 02/09/2010  ? hands , lumbar stenosis   ? DEPRESSION 02/09/2010  ? pt. takes Cymbalta for pain issues   ? GERD 02/09/2010  ? HYPERLIPIDEMIA 02/09/2010  ? HYPERTENSION 02/09/2010  ? Lumbar disc disease   ? Memory loss   ? Neuromuscular disorder ([place])   ? neuritis in head & neck  ? OCCIPITAL NEURALGIA 06/09/2010  ? OSTEOPENIA 02/09/2010  ? Pneumonia   ? "as an infant" (05/15/2012)  ? Varicose vein of leg   ? BLE  ? ? ?PAST SURGICAL HISTORY: ?Past Surgical History:  ?Procedure Laterality Date  ? BREAST BIOPSY  1988  ? left; Neg  ? CHOLECYSTECTOMY  ? 2009  ? chymopapain injection  1986  ? Lumbar  ? COLONOSCOPY    ? DILATION AND  CURETTAGE OF UTERUS  1960's  ? HERNIA REPAIR  05/15/2012  ? ventral incisional   ? LUMBAR FUSION  05/22/2013  ? L2  L3 decompression /fusion      Dr Lynann Bologna  ? LUMBAR LAMINECTOMY  1986  ? OVARIAN CYST REMO

## 2021-11-18 ENCOUNTER — Ambulatory Visit (INDEPENDENT_AMBULATORY_CARE_PROVIDER_SITE_OTHER): Payer: Medicare Other | Admitting: Internal Medicine

## 2021-11-18 ENCOUNTER — Encounter: Payer: Self-pay | Admitting: Internal Medicine

## 2021-11-18 VITALS — BP 126/70 | HR 67 | Temp 97.6°F | Ht 65.0 in | Wt 129.0 lb

## 2021-11-18 DIAGNOSIS — R739 Hyperglycemia, unspecified: Secondary | ICD-10-CM | POA: Diagnosis not present

## 2021-11-18 DIAGNOSIS — E559 Vitamin D deficiency, unspecified: Secondary | ICD-10-CM

## 2021-11-18 DIAGNOSIS — G3184 Mild cognitive impairment, so stated: Secondary | ICD-10-CM

## 2021-11-18 DIAGNOSIS — F0390 Unspecified dementia without behavioral disturbance: Secondary | ICD-10-CM

## 2021-11-18 DIAGNOSIS — E7800 Pure hypercholesterolemia, unspecified: Secondary | ICD-10-CM | POA: Diagnosis not present

## 2021-11-18 DIAGNOSIS — I10 Essential (primary) hypertension: Secondary | ICD-10-CM

## 2021-11-18 DIAGNOSIS — E538 Deficiency of other specified B group vitamins: Secondary | ICD-10-CM

## 2021-11-18 LAB — CBC WITH DIFFERENTIAL/PLATELET
Basophils Absolute: 0 10*3/uL (ref 0.0–0.1)
Basophils Relative: 0.6 % (ref 0.0–3.0)
Eosinophils Absolute: 0.1 10*3/uL (ref 0.0–0.7)
Eosinophils Relative: 1.3 % (ref 0.0–5.0)
HCT: 46.3 % — ABNORMAL HIGH (ref 36.0–46.0)
Hemoglobin: 15.6 g/dL — ABNORMAL HIGH (ref 12.0–15.0)
Lymphocytes Relative: 29.1 % (ref 12.0–46.0)
Lymphs Abs: 1.6 10*3/uL (ref 0.7–4.0)
MCHC: 33.6 g/dL (ref 30.0–36.0)
MCV: 95.3 fl (ref 78.0–100.0)
Monocytes Absolute: 0.5 10*3/uL (ref 0.1–1.0)
Monocytes Relative: 9.1 % (ref 3.0–12.0)
Neutro Abs: 3.3 10*3/uL (ref 1.4–7.7)
Neutrophils Relative %: 59.9 % (ref 43.0–77.0)
Platelets: 244 10*3/uL (ref 150.0–400.0)
RBC: 4.86 Mil/uL (ref 3.87–5.11)
RDW: 13.2 % (ref 11.5–15.5)
WBC: 5.4 10*3/uL (ref 4.0–10.5)

## 2021-11-18 LAB — HEPATIC FUNCTION PANEL
ALT: 15 U/L (ref 0–35)
AST: 19 U/L (ref 0–37)
Albumin: 4.7 g/dL (ref 3.5–5.2)
Alkaline Phosphatase: 52 U/L (ref 39–117)
Bilirubin, Direct: 0.1 mg/dL (ref 0.0–0.3)
Total Bilirubin: 0.6 mg/dL (ref 0.2–1.2)
Total Protein: 7.3 g/dL (ref 6.0–8.3)

## 2021-11-18 LAB — TSH: TSH: 2.28 u[IU]/mL (ref 0.35–5.50)

## 2021-11-18 LAB — BASIC METABOLIC PANEL
BUN: 16 mg/dL (ref 6–23)
CO2: 26 mEq/L (ref 19–32)
Calcium: 9.7 mg/dL (ref 8.4–10.5)
Chloride: 99 mEq/L (ref 96–112)
Creatinine, Ser: 0.93 mg/dL (ref 0.40–1.20)
GFR: 57.6 mL/min — ABNORMAL LOW (ref >60.00)
Glucose, Bld: 100 mg/dL — ABNORMAL HIGH (ref 70–99)
Potassium: 3.7 mEq/L (ref 3.5–5.1)
Sodium: 133 mEq/L — ABNORMAL LOW (ref 135–145)

## 2021-11-18 LAB — URINALYSIS, ROUTINE W REFLEX MICROSCOPIC
Bilirubin Urine: NEGATIVE
Hgb urine dipstick: NEGATIVE
Leukocytes,Ua: NEGATIVE
Nitrite: NEGATIVE
Specific Gravity, Urine: 1.015 (ref 1.000–1.030)
Total Protein, Urine: NEGATIVE
Urine Glucose: NEGATIVE
Urobilinogen, UA: 0.2 (ref 0.0–1.0)
pH: 6 (ref 5.0–8.0)

## 2021-11-18 LAB — LIPID PANEL
Cholesterol: 161 mg/dL (ref 0–200)
HDL: 72.8 mg/dL (ref >39.00)
LDL Cholesterol: 76 mg/dL (ref 0–99)
NonHDL: 88.41
Total CHOL/HDL Ratio: 2
Triglycerides: 61 mg/dL (ref 0.0–149.0)
VLDL: 12.2 mg/dL (ref 0.0–40.0)

## 2021-11-18 LAB — VITAMIN D 25 HYDROXY (VIT D DEFICIENCY, FRACTURES): VITD: 96.09 ng/mL (ref 30.00–100.00)

## 2021-11-18 LAB — VITAMIN B12: Vitamin B-12: 290 pg/mL (ref 211–911)

## 2021-11-18 LAB — HEMOGLOBIN A1C: Hgb A1c MFr Bld: 5.5 % (ref 4.6–6.5)

## 2021-11-18 MED ORDER — SIMVASTATIN 20 MG PO TABS: ORAL_TABLET | ORAL | 3 refills | Status: DC

## 2021-11-18 NOTE — Patient Instructions (Signed)

## 2021-11-18 NOTE — Progress Notes (Signed)
Patient ID: Crystal Herrera, female   DOB: [birth year], 81 y.o.   MRN: [id number] ? ? ? ?     Chief Complaint:: yearly exam ? ?     HPI:  Crystal Herrera is a 81 y.o. female here with husband, overall doing well; has been seen recently with new onset worsening cognitive impairment, ,Pt denies chest pain, increased sob or doe, wheezing, orthopnea, PND, increased LE swelling, palpitations, dizziness or syncope.   Pt denies polydipsia, polyuria, or new focal neuro s/s.    Pt denies fever, wt loss, night sweats, loss of appetite, or other constitutional symptoms  No other new complaints ?Wt Readings from Last 3 Encounters:  ?11/18/21 129 lb (58.5 kg)  ?11/08/21 132 lb 3.2 oz (60 kg)  ?05/19/21 131 lb (59.4 kg)  ? ?BP Readings from Last 3 Encounters:  ?11/18/21 126/70  ?11/08/21 135/78  ?05/19/21 118/68  ? ?Immunization History  ?Administered Date(s) Administered  ? Influenza Split 05/24/2011  ? Influenza Whole 06/09/2010  ? Influenza, High Dose Seasonal PF 04/29/2016, 04/13/2017, 05/12/2018, 05/03/2021  ? Influenza,inj,Quad PF,6+ Mos 04/08/2014, 05/08/2015  ? Influenza-Unspecified 04/24/2013, 04/24/2018, 04/13/2020  ? PFIZER(Purple Top)SARS-COV-2 Vaccination 08/15/2019, 08/29/2019, 04/20/2020, 05/03/2021  ? Pneumococcal Conjugate-13 10/04/2013  ? Pneumococcal Polysaccharide-23 07/25/2005  ? Td 01/22/2002  ? Tetanus 10/04/2013  ? Zoster Recombinat (Shingrix) 05/31/2017, 07/29/2017  ? ?There are no preventive care reminders to display for this patient. ? ?  ? ?Past Medical History:  ?Diagnosis Date  ? Allergic rhinitis, cause unspecified 01/29/2013  ? Anemia 1948?  ? BACK PAIN, CHRONIC 02/09/2010  ? Cancer [facility])   ? SKIN  ? Cataract   ? COLONIC POLYPS, HX OF 02/09/2010  ? COMMON MIGRAINE 02/09/2010  ? neuritis in R side of neck & head  ? DEGENERATIVE JOINT DISEASE 02/09/2010  ? hands , lumbar stenosis   ? DEPRESSION 02/09/2010  ? pt. takes Cymbalta for pain issues   ? GERD 02/09/2010  ? HYPERLIPIDEMIA 02/09/2010  ? HYPERTENSION  02/09/2010  ? Lumbar disc disease   ? Memory loss   ? Neuromuscular disorder ([place])   ? neuritis in head & neck  ? OCCIPITAL NEURALGIA 06/09/2010  ? OSTEOPENIA 02/09/2010  ? Pneumonia   ? "as an infant" (05/15/2012)  ? Varicose vein of leg   ? BLE  ? ?Past Surgical History:  ?Procedure Laterality Date  ? BREAST BIOPSY  1988  ? left; Neg  ? CHOLECYSTECTOMY  ? 2009  ? chymopapain injection  1986  ? Lumbar  ? COLONOSCOPY    ? DILATION AND CURETTAGE OF UTERUS  1960's  ? HERNIA REPAIR  05/15/2012  ? ventral incisional   ? LUMBAR FUSION  05/22/2013  ? L2  L3 decompression /fusion      Dr Lynann Bologna  ? LUMBAR LAMINECTOMY  1986  ? OVARIAN CYST REMOVAL  2006  ? Stimulator placed   07/2014  ? placed 2 years ago for pain  ? TRANSFORAMINAL LUMBAR INTERBODY FUSION (TLIF) WITH PEDICLE SCREW FIXATION 1 LEVEL Left 05/22/2013  ? Procedure: TRANSFORAMINAL LUMBAR INTERBODY FUSION (TLIF) WITH PEDICLE SCREW FIXATION 1 LEVEL;  Surgeon: Sinclair Ship, MD;  Location: [place];  Service: Orthopedics;  Laterality: Left;  Left sided lumbar 2-3 Transforaminal lumbar interbody fusion with instrumentation, allograft  ? TUBAL LIGATION  1970's  ? VARICOSE VEIN SURGERY  ~ 2008  ? bilaterally  ? VENTRAL HERNIA REPAIR  05/15/2012  ? Procedure: LAPAROSCOPIC VENTRAL HERNIA;  Surgeon: Gayland Curry, MD,FACS;  Location: [place];  Service: General;  Laterality: N/A;  laparoscopic ventral incisional hernia repair with mesh  ? ? reports that she quit smoking about 50 years ago. Her smoking use included cigarettes. She has a 10.00 pack-year smoking history. She has never used smokeless tobacco. She reports that she does not drink alcohol and does not use drugs. ?family history includes Breast cancer in her maternal grandmother; COPD in her mother; Cancer in her maternal grandmother, mother, and son; Colon cancer in her maternal aunt; Diabetes in her father; Lung cancer in her mother; Varicose Veins in her mother. ?Allergies  ?Allergen Reactions  ? Cefuroxime    ?  Might cause hives  ? Cephalosporins   ?  Might cause hives ?  ? Latex Nausea Only  ? Celexa [Citalopram Hydrobromide] Nausea Only  ? Dexlansoprazole Anxiety  ?  Sleeplessness heart racing headaches chiulls  ? Lexapro [Escitalopram Oxalate] Nausea Only  ? Penicillins Hives  ? Sulfonamide Derivatives Hives  ? Vancomycin Rash  ? ?Current Outpatient Medications on File Prior to Visit  ?Medication Sig Dispense Refill  ? Biotin 1000 MCG tablet Take by mouth.    ? calcium carbonate (OSCAL) 1500 (600 Ca) MG TABS tablet Take by mouth.    ? Cholecalciferol (VITAMIN D3 PO) Take 2,000 Units by mouth daily.     ? donepezil (ARICEPT) 10 MG tablet Take 1 tablet (10 mg total) by mouth at bedtime. 90 tablet 3  ? memantine (NAMENDA) 10 MG tablet Take 1 tablet (10 mg total) by mouth 2 (two) times daily. 180 tablet 3  ? ?No current facility-administered medications on file prior to visit.  ? ?     ROS:  All others reviewed and negative. ? ?Objective  ? ?     PE:  BP 126/70 (BP Location: Left Arm, Patient Position: Sitting, Cuff Size: Normal)   Pulse 67   Temp 97.6 ?F (36.4 ?C) (Oral)   Ht '5\' 5"'$  (1.651 m)   Wt 129 lb (58.5 kg)   SpO2 100%   BMI 21.47 kg/m?  ? ?              Constitutional: Pt appears in NAD ?              HENT: Head: NCAT.  ?              Right Ear: External ear normal.   ?              Left Ear: External ear normal.  ?              Eyes: . Pupils are equal, round, and reactive to light. Conjunctivae and EOM are normal ?              Nose: without d/c or deformity ?              Neck: Neck supple. Gross normal ROM ?              Cardiovascular: Normal rate and regular rhythm.   ?              Pulmonary/Chest: Effort normal and breath sounds without rales or wheezing.  ?              Abd:  Soft, NT, ND, + BS, no organomegaly ?              Neurological: Pt is alert. At baseline orientation, motor grossly intact ?  Skin: Skin is warm. No rashes, no other new lesions, LE edema - none ?               Psychiatric: Pt behavior is normal without agitation  ? ?Micro: none ? ?Cardiac tracings I have personally interpreted today:  none ? ?Pertinent Radiological findings (summarize): none  ? ?Lab Results  ?Component Value Date  ? WBC 5.4 11/18/2021  ? HGB 15.6 (H) 11/18/2021  ? HCT 46.3 (H) 11/18/2021  ? PLT 244.0 11/18/2021  ? GLUCOSE 100 (H) 11/18/2021  ? CHOL 161 11/18/2021  ? TRIG 61.0 11/18/2021  ? HDL 72.80 11/18/2021  ? LDLDIRECT 122.2 02/09/2010  ? [place] 76 11/18/2021  ? ALT 15 11/18/2021  ? AST 19 11/18/2021  ? NA 133 (L) 11/18/2021  ? K 3.7 11/18/2021  ? CL 99 11/18/2021  ? CREATININE 0.93 11/18/2021  ? BUN 16 11/18/2021  ? CO2 26 11/18/2021  ? TSH 2.28 11/18/2021  ? INR 0.97 05/22/2013  ? HGBA1C 5.5 11/18/2021  ? ?Assessment/Plan:  ?LASHAUNA ARPIN is a 81 y.o. White or Caucasian [1] female with  has a past medical history of Allergic rhinitis, cause unspecified (01/29/2013), Anemia (1948?), BACK PAIN, CHRONIC (02/09/2010), Cancer ([place]), Cataract, COLONIC POLYPS, HX OF (02/09/2010), COMMON MIGRAINE (02/09/2010), DEGENERATIVE JOINT DISEASE (02/09/2010), DEPRESSION (02/09/2010), GERD (02/09/2010), HYPERLIPIDEMIA (02/09/2010), HYPERTENSION (02/09/2010), Lumbar disc disease, Memory loss, Neuromuscular disorder ([place]), OCCIPITAL NEURALGIA (06/09/2010), OSTEOPENIA (02/09/2010), Pneumonia, and Varicose vein of leg. ? ?Hyperlipidemia ?Lab Results  ?Component Value Date  ? [place] 76 11/18/2021  ? ?Stable, pt to continue current statin zocor ? ? ?Essential hypertension ?BP Readings from Last 3 Encounters:  ?11/18/21 126/70  ?11/08/21 135/78  ?05/19/21 118/68  ? ?Stable, pt to continue medical treatment  - diet, low salt diet ? ? ?Vitamin D deficiency ?Last vitamin D ?Lab Results  ?Component Value Date  ? VD25OH 96.09 11/18/2021  ? ?Stable, cont oral replacement ? ? ?Mild cognitive impairment ?Stable, tolerating aricept and namenda,  to f/u any worsening symptoms or concerns ? ?Dementia without behavioral disturbance  ([place]) ?Stable, tolerating aricept and namenda,  to f/u any worsening symptoms or concerns ? ?Followup: Return in about 6 months (around 05/20/2022). ? ?Cathlean Cower, MD 11/22/2021 7:39 PM ?[place] ?[place] Comment

## 2021-11-22 ENCOUNTER — Encounter: Payer: Self-pay | Admitting: Internal Medicine

## 2021-11-22 NOTE — Assessment & Plan Note (Signed)
Stable, tolerating aricept and namenda,  to f/u any worsening symptoms or concerns ?

## 2021-11-22 NOTE — Assessment & Plan Note (Signed)
Last vitamin D Lab Results  Component Value Date   VD25OH 96.09 11/18/2021   Stable, cont oral replacement  

## 2021-11-22 NOTE — Assessment & Plan Note (Signed)
Lab Results  ?Component Value Date  ? [place] 76 11/18/2021  ? ?Stable, pt to continue current statin zocor ? ?

## 2021-11-22 NOTE — Assessment & Plan Note (Signed)
BP Readings from Last 3 Encounters:  ?11/18/21 126/70  ?11/08/21 135/78  ?05/19/21 118/68  ? ?Stable, pt to continue medical treatment  - diet, low salt diet ? ?

## 2022-04-26 DIAGNOSIS — Z23 Encounter for immunization: Secondary | ICD-10-CM | POA: Diagnosis not present

## 2022-05-23 ENCOUNTER — Ambulatory Visit: Payer: Medicare Other | Admitting: Internal Medicine

## 2022-05-25 ENCOUNTER — Inpatient Hospital Stay: Admission: RE | Admit: 2022-05-25 | Payer: Medicare Other | Source: Ambulatory Visit

## 2022-05-25 ENCOUNTER — Encounter: Payer: Self-pay | Admitting: Internal Medicine

## 2022-05-25 ENCOUNTER — Ambulatory Visit (INDEPENDENT_AMBULATORY_CARE_PROVIDER_SITE_OTHER): Payer: Medicare Other | Admitting: Internal Medicine

## 2022-05-25 VITALS — BP 124/72 | HR 66 | Temp 98.0°F | Ht 65.0 in | Wt 129.0 lb

## 2022-05-25 DIAGNOSIS — E7800 Pure hypercholesterolemia, unspecified: Secondary | ICD-10-CM | POA: Diagnosis not present

## 2022-05-25 DIAGNOSIS — E559 Vitamin D deficiency, unspecified: Secondary | ICD-10-CM | POA: Diagnosis not present

## 2022-05-25 DIAGNOSIS — E2839 Other primary ovarian failure: Secondary | ICD-10-CM

## 2022-05-25 DIAGNOSIS — F0390 Unspecified dementia without behavioral disturbance: Secondary | ICD-10-CM

## 2022-05-25 NOTE — Progress Notes (Signed)
Patient ID: Crystal Herrera, female   DOB: 04/29/1941, 81 y.o.   MRN: [id number]        Chief Complaint: follow up dementia, low vit d, hld       HPI:  Crystal Herrera is a 81 y.o. female here with husband, overall doing well.  Pt denies chest pain, increased sob or doe, wheezing, orthopnea, PND, increased LE swelling, palpitations, dizziness or syncope.   Pt denies polydipsia, polyuria, or new focal neuro s/s.   Dementia overall stable symptomatically, and not assoc with behavioral changes such as hallucinations, paranoia, or agitation.  Due for DXA        Wt Readings from Last 3 Encounters:  05/25/22 129 lb (58.5 kg)  11/18/21 129 lb (58.5 kg)  11/08/21 132 lb 3.2 oz (60 kg)   BP Readings from Last 3 Encounters:  05/25/22 124/72  11/18/21 126/70  11/08/21 135/78         Past Medical History:  Diagnosis Date   Allergic rhinitis, cause unspecified 01/29/2013   Anemia 1948?   BACK PAIN, CHRONIC 02/09/2010   Cancer ([place])    SKIN   Cataract    COLONIC POLYPS, HX OF 02/09/2010   COMMON MIGRAINE 02/09/2010   neuritis in R side of neck & head   DEGENERATIVE JOINT DISEASE 02/09/2010   hands , lumbar stenosis    DEPRESSION 02/09/2010   pt. takes Cymbalta for pain issues    GERD 02/09/2010   HYPERLIPIDEMIA 02/09/2010   HYPERTENSION 02/09/2010   Lumbar disc disease    Memory loss    Neuromuscular disorder ([place])    neuritis in head & neck   OCCIPITAL NEURALGIA 06/09/2010   OSTEOPENIA 02/09/2010   Pneumonia    "as an infant" (05/15/2012)   Varicose vein of leg    BLE   Past Surgical History:  Procedure Laterality Date   BREAST BIOPSY  1988   left; Neg   CHOLECYSTECTOMY  ? 2009   chymopapain injection  1986   Lumbar   COLONOSCOPY     DILATION AND CURETTAGE OF UTERUS  1960's   HERNIA REPAIR  05/15/2012   ventral incisional    LUMBAR FUSION  05/22/2013   L2  L3 decompression /fusion      Dr Lynann Bologna   LUMBAR LAMINECTOMY  1986   OVARIAN CYST REMOVAL  2006   Stimulator placed    07/2014   placed 2 years ago for pain   TRANSFORAMINAL LUMBAR INTERBODY FUSION (TLIF) WITH PEDICLE SCREW FIXATION 1 LEVEL Left 05/22/2013   Procedure: TRANSFORAMINAL LUMBAR INTERBODY FUSION (TLIF) WITH PEDICLE SCREW FIXATION 1 LEVEL;  Surgeon: Sinclair Ship, MD;  Location: [place];  Service: Orthopedics;  Laterality: Left;  Left sided lumbar 2-3 Transforaminal lumbar interbody fusion with instrumentation, allograft   TUBAL LIGATION  1970's   VARICOSE VEIN SURGERY  ~ 2008   bilaterally   VENTRAL HERNIA REPAIR  05/15/2012   Procedure: LAPAROSCOPIC VENTRAL HERNIA;  Surgeon: Gayland Curry, MD,FACS;  Location: [place];  Service: General;  Laterality: N/A;  laparoscopic ventral incisional hernia repair with mesh    reports that she quit smoking about 51 years ago. Her smoking use included cigarettes. She has a 10.00 pack-year smoking history. She has never used smokeless tobacco. She reports that she does not drink alcohol and does not use drugs. family history includes Breast cancer in her maternal grandmother; COPD in her mother; Cancer in her maternal grandmother, mother, and son; Colon cancer in her maternal  aunt; Diabetes in her father; Lung cancer in her mother; Varicose Veins in her mother. Allergies  Allergen Reactions   Cefuroxime     Might cause hives   Cephalosporins     Might cause hives    Latex Nausea Only   Celexa [Citalopram Hydrobromide] Nausea Only   Dexlansoprazole Anxiety    Sleeplessness heart racing headaches chiulls   Lexapro [Escitalopram Oxalate] Nausea Only   Penicillins Hives   Sulfonamide Derivatives Hives   Vancomycin Rash   Current Outpatient Medications on File Prior to Visit  Medication Sig Dispense Refill   Biotin 1000 MCG tablet Take by mouth.     calcium carbonate (OSCAL) 1500 (600 Ca) MG TABS tablet Take by mouth.     Cholecalciferol (VITAMIN D3 PO) Take 2,000 Units by mouth daily.      donepezil (ARICEPT) 10 MG tablet Take 1 tablet (10 mg total) by  mouth at bedtime. 90 tablet 3   memantine (NAMENDA) 10 MG tablet Take 1 tablet (10 mg total) by mouth 2 (two) times daily. 180 tablet 3   simvastatin (ZOCOR) 20 MG tablet 1 tab by mouth once daily 90 tablet 3   No current facility-administered medications on file prior to visit.        ROS:  All others reviewed and negative.  Objective        PE:  BP 124/72 (BP Location: Left Arm, Patient Position: Sitting, Cuff Size: Large)   Pulse 66   Temp 98 F (36.7 C) (Oral)   Ht '5\' 5"'$  (1.651 m)   Wt 129 lb (58.5 kg)   SpO2 95%   BMI 21.47 kg/m                 Constitutional: Pt appears in NAD               HENT: Head: NCAT.                Right Ear: External ear normal.                 Left Ear: External ear normal.                Eyes: . Pupils are equal, round, and reactive to light. Conjunctivae and EOM are normal               Nose: without d/c or deformity               Neck: Neck supple. Gross normal ROM               Cardiovascular: Normal rate and regular rhythm.                 Pulmonary/Chest: Effort normal and breath sounds without rales or wheezing.                Abd:  Soft, NT, ND, + BS, no organomegaly               Neurological: Pt is alert. At baseline orientation, motor grossly intact               Skin: Skin is warm. No rashes, no other new lesions, LE edema - none               Psychiatric: Pt behavior is normal without agitation   Micro: none  Cardiac tracings I have personally interpreted today:  none  Pertinent Radiological findings (summarize): none   Lab Results  Component  Value Date   WBC 5.4 11/18/2021   HGB 15.6 (H) 11/18/2021   HCT 46.3 (H) 11/18/2021   PLT 244.0 11/18/2021   GLUCOSE 100 (H) 11/18/2021   CHOL 161 11/18/2021   TRIG 61.0 11/18/2021   HDL 72.80 11/18/2021   LDLDIRECT 122.2 02/09/2010   LDLCALC 76 11/18/2021   ALT 15 11/18/2021   AST 19 11/18/2021   NA 133 (L) 11/18/2021   K 3.7 11/18/2021   CL 99 11/18/2021   CREATININE 0.93  11/18/2021   BUN 16 11/18/2021   CO2 26 11/18/2021   TSH 2.28 11/18/2021   INR 0.97 05/22/2013   HGBA1C 5.5 11/18/2021   Assessment/Plan:  Crystal Herrera is a 81 y.o. White or Caucasian [1] female with  has a past medical history of Allergic rhinitis, cause unspecified (01/29/2013), Anemia (1948?), BACK PAIN, CHRONIC (02/09/2010), Cancer ([place]), Cataract, COLONIC POLYPS, HX OF (02/09/2010), COMMON MIGRAINE (02/09/2010), DEGENERATIVE JOINT DISEASE (02/09/2010), DEPRESSION (02/09/2010), GERD (02/09/2010), HYPERLIPIDEMIA (02/09/2010), HYPERTENSION (02/09/2010), Lumbar disc disease, Memory loss, Neuromuscular disorder ([place]), OCCIPITAL NEURALGIA (06/09/2010), OSTEOPENIA (02/09/2010), Pneumonia, and Varicose vein of leg.  Dementia without behavioral disturbance ([facility]) Overall stable per husband, to continue dual tx - aricept, namenda  Vitamin D deficiency Last vitamin D Lab Results  Component Value Date   VD25OH 96.09 11/18/2021   Stable, cont oral replacement   Hyperlipidemia Lab Results  Component Value Date   LDLCALC 76 11/18/2021   Uncontrolled, goal ldl < 70,, pt to continue current statin zocor 20 mg qd as declines change, and lower chol diet   Estrogen deficiency Due for DXa - will order  Followup: Return in about 6 months (around 11/23/2022).  Cathlean Cower, MD 05/25/2022 8:01 PM [facility]

## 2022-05-25 NOTE — Patient Instructions (Signed)
Please schedule the bone density test before leaving today at the scheduling desk (where you check out)  Please continue all other medications as before, and refills have been done if requested.  Please have the pharmacy call with any other refills you may need.  Please continue your efforts at being more active, low cholesterol diet, and weight control.  You are otherwise up to date with prevention measures today.  Please keep your appointments with your specialists as you may have planned  We can hold on further lab testing today  Please make an Appointment to return in 6 months, or sooner if needed

## 2022-05-25 NOTE — Assessment & Plan Note (Signed)
Lab Results  Component Value Date   LDLCALC 76 11/18/2021   Uncontrolled, goal ldl < 70,, pt to continue current statin zocor 20 mg qd as declines change, and lower chol diet

## 2022-05-25 NOTE — Assessment & Plan Note (Signed)
Due for DXa - will order

## 2022-05-25 NOTE — Assessment & Plan Note (Signed)
Last vitamin D Lab Results  Component Value Date   VD25OH 96.09 11/18/2021   Stable, cont oral replacement

## 2022-05-25 NOTE — Assessment & Plan Note (Signed)
Overall stable per husband, to continue dual tx - aricept, namenda

## 2022-05-27 ENCOUNTER — Ambulatory Visit (INDEPENDENT_AMBULATORY_CARE_PROVIDER_SITE_OTHER)
Admission: RE | Admit: 2022-05-27 | Discharge: 2022-05-27 | Disposition: A | Discharge status: Home / Self Care | Payer: Medicare Other | Source: Ambulatory Visit | Attending: Internal Medicine | Admitting: Internal Medicine

## 2022-05-27 DIAGNOSIS — E2839 Other primary ovarian failure: Secondary | ICD-10-CM

## 2022-05-30 ENCOUNTER — Other Ambulatory Visit: Payer: Self-pay | Admitting: Internal Medicine

## 2022-05-30 ENCOUNTER — Encounter: Payer: Self-pay | Admitting: Internal Medicine

## 2022-05-30 MED ORDER — ALENDRONATE SODIUM 70 MG PO TABS: 70.0000 mg | ORAL_TABLET | ORAL | 3 refills | Status: DC

## 2022-05-30 NOTE — Progress Notes (Signed)
With regards to bone density results  The test results show that your current treatment is OK, except the bone density testing is consistent with severe Osteopenia, but not quite to osteoporosis yet.  This is close, however. So we should start Foxamax 70 mg weekly to stabilize the bones and avoid any worsening.  I will send the prescription, and you should hear from the office as well.    Staff to please inform pt, I will do rx x 1

## 2022-06-01 DIAGNOSIS — Z85828 Personal history of other malignant neoplasm of skin: Secondary | ICD-10-CM | POA: Diagnosis not present

## 2022-06-01 DIAGNOSIS — L570 Actinic keratosis: Secondary | ICD-10-CM | POA: Diagnosis not present

## 2022-06-01 DIAGNOSIS — D1801 Hemangioma of skin and subcutaneous tissue: Secondary | ICD-10-CM | POA: Diagnosis not present

## 2022-06-01 DIAGNOSIS — L821 Other seborrheic keratosis: Secondary | ICD-10-CM | POA: Diagnosis not present

## 2022-06-01 DIAGNOSIS — D485 Neoplasm of uncertain behavior of skin: Secondary | ICD-10-CM | POA: Diagnosis not present

## 2022-10-04 ENCOUNTER — Encounter: Payer: Self-pay | Admitting: Internal Medicine

## 2022-10-11 ENCOUNTER — Ambulatory Visit (INDEPENDENT_AMBULATORY_CARE_PROVIDER_SITE_OTHER): Payer: Medicare Other | Admitting: Internal Medicine

## 2022-10-11 ENCOUNTER — Encounter: Payer: Self-pay | Admitting: Internal Medicine

## 2022-10-11 VITALS — BP 134/82 | HR 64 | Temp 98.2°F | Ht 65.0 in | Wt 140.0 lb

## 2022-10-11 DIAGNOSIS — E7800 Pure hypercholesterolemia, unspecified: Secondary | ICD-10-CM | POA: Diagnosis not present

## 2022-10-11 DIAGNOSIS — F02818 Dementia in other diseases classified elsewhere, unspecified severity, with other behavioral disturbance: Secondary | ICD-10-CM | POA: Diagnosis not present

## 2022-10-11 DIAGNOSIS — G309 Alzheimer's disease, unspecified: Secondary | ICD-10-CM

## 2022-10-11 DIAGNOSIS — E559 Vitamin D deficiency, unspecified: Secondary | ICD-10-CM | POA: Diagnosis not present

## 2022-10-11 DIAGNOSIS — F0390 Unspecified dementia without behavioral disturbance: Secondary | ICD-10-CM

## 2022-10-11 MED ORDER — QUETIAPINE FUMARATE 25 MG PO TABS: 25.0000 mg | ORAL_TABLET | Freq: Two times a day (BID) | ORAL | 11 refills | Status: DC

## 2022-10-11 NOTE — Progress Notes (Signed)
Patient ID: Crystal Herrera, adult   DOB: Mar 26, 1941, 82 y.o.   MRN: [id number]        Chief Complaint: follow up HTN, HLD and hyperglycemia , memory loss       HPI:  Crystal Herrera is a 82 y.o. adult here with husband with c/o memory loss, has not wanted MRI in past but ok now, and will consider neurology referral.  Behavior has been worsening with agitation and some very mild agression verbal during day hours.  Pt denies chest pain, increased sob or doe, wheezing, orthopnea, PND, increased LE swelling, palpitations, dizziness or syncope.   Pt denies polydipsia, polyuria, or new focal neuro s/s.    Pt denies fever, wt loss, night sweats, loss of appetite, or other constitutional symptoms  Wt Readings from Last 3 Encounters:  10/11/22 140 lb (63.5 kg)  05/25/22 129 lb (58.5 kg)  11/18/21 129 lb (58.5 kg)   BP Readings from Last 3 Encounters:  10/11/22 134/82  05/25/22 124/72  11/18/21 126/70         Past Medical History:  Diagnosis Date   Allergic rhinitis, cause unspecified 01/29/2013   Anemia 1948?   BACK PAIN, CHRONIC 02/09/2010   Cancer ([place])    SKIN   Cataract    COLONIC POLYPS, HX OF 02/09/2010   COMMON MIGRAINE 02/09/2010   neuritis in R side of neck & head   DEGENERATIVE JOINT DISEASE 02/09/2010   hands , lumbar stenosis    DEPRESSION 02/09/2010   pt. takes Cymbalta for pain issues    GERD 02/09/2010   HYPERLIPIDEMIA 02/09/2010   HYPERTENSION 02/09/2010   Lumbar disc disease    Memory loss    Neuromuscular disorder ([place])    neuritis in head & neck   OCCIPITAL NEURALGIA 06/09/2010   OSTEOPENIA 02/09/2010   Pneumonia    "as an infant" (05/15/2012)   Varicose vein of leg    BLE   Past Surgical History:  Procedure Laterality Date   BREAST BIOPSY  1988   left; Neg   CHOLECYSTECTOMY  ? 2009   chymopapain injection  1986   Lumbar   COLONOSCOPY     DILATION AND CURETTAGE OF UTERUS  1960's   HERNIA REPAIR  05/15/2012   ventral incisional    LUMBAR FUSION  05/22/2013    L2  L3 decompression /fusion      Dr Lynann Bologna   LUMBAR LAMINECTOMY  1986   OVARIAN CYST REMOVAL  2006   Stimulator placed   07/2014   placed 2 years ago for pain   TRANSFORAMINAL LUMBAR INTERBODY FUSION (TLIF) WITH PEDICLE SCREW FIXATION 1 LEVEL Left 05/22/2013   Procedure: TRANSFORAMINAL LUMBAR INTERBODY FUSION (TLIF) WITH PEDICLE SCREW FIXATION 1 LEVEL;  Surgeon: Sinclair Ship, MD;  Location: [facility];  Service: Orthopedics;  Laterality: Left;  Left sided lumbar 2-3 Transforaminal lumbar interbody fusion with instrumentation, allograft   TUBAL LIGATION  1970's   VARICOSE VEIN SURGERY  ~ 2008   bilaterally   VENTRAL HERNIA REPAIR  05/15/2012   Procedure: LAPAROSCOPIC VENTRAL HERNIA;  Surgeon: Gayland Curry, MD,FACS;  Location: [place];  Service: General;  Laterality: N/A;  laparoscopic ventral incisional hernia repair with mesh    reports that he quit smoking about 51 years ago. His smoking use included cigarettes. He has a 10.00 pack-year smoking history. He has never used smokeless tobacco. He reports that he does not drink alcohol and does not use drugs. family history includes Breast cancer in  his maternal grandmother; COPD in his mother; Cancer in his maternal grandmother, mother, and son; Colon cancer in his maternal aunt; Diabetes in his father; Lung cancer in his mother; Varicose Veins in his mother. Allergies  Allergen Reactions   Cefuroxime     Might cause hives   Cephalosporins     Might cause hives    Latex Nausea Only   Celexa [Citalopram Hydrobromide] Nausea Only   Dexlansoprazole Anxiety    Sleeplessness heart racing headaches chiulls   Lexapro [Escitalopram Oxalate] Nausea Only   Penicillins Hives   Sulfonamide Derivatives Hives   Vancomycin Rash   Current Outpatient Medications on File Prior to Visit  Medication Sig Dispense Refill   Biotin 1000 MCG tablet Take by mouth.     calcium carbonate (OSCAL) 1500 (600 Ca) MG TABS tablet Take by mouth.      Cholecalciferol (VITAMIN D3 PO) Take 2,000 Units by mouth daily.      donepezil (ARICEPT) 10 MG tablet Take 1 tablet (10 mg total) by mouth at bedtime. 90 tablet 3   memantine (NAMENDA) 10 MG tablet Take 1 tablet (10 mg total) by mouth 2 (two) times daily. 180 tablet 3   simvastatin (ZOCOR) 20 MG tablet 1 tab by mouth once daily 90 tablet 3   No current facility-administered medications on file prior to visit.        ROS:  All others reviewed and negative.  Objective        PE:  BP 134/82   Pulse 64   Temp 98.2 F (36.8 C) (Oral)   Ht 5\' 5"  (1.651 m)   Wt 140 lb (63.5 kg)   SpO2 95%   BMI 23.30 kg/m                 Constitutional: Pt appears in NAD               HENT: Head: NCAT.                Right Ear: External ear normal.                 Left Ear: External ear normal.                Eyes: . Pupils are equal, round, and reactive to light. Conjunctivae and EOM are normal               Nose: without d/c or deformity               Neck: Neck supple. Gross normal ROM               Cardiovascular: Normal rate and regular rhythm.                 Pulmonary/Chest: Effort normal and breath sounds without rales or wheezing.                Abd:  Soft, NT, ND, + BS, no organomegaly               Neurological: Pt is alert. Pt oriented to name and medical office only, motor grossly intact               Skin: Skin is warm. No rashes, no other new lesions, LE edema - none               Psychiatric: Pt behavior is normal without agitation   Micro: none  Cardiac tracings I have personally interpreted  today:  none  Pertinent Radiological findings (summarize): none   Lab Results  Component Value Date   WBC 5.4 11/18/2021   HGB 15.6 (H) 11/18/2021   HCT 46.3 (H) 11/18/2021   PLT 244.0 11/18/2021   GLUCOSE 100 (H) 11/18/2021   CHOL 161 11/18/2021   TRIG 61.0 11/18/2021   HDL 72.80 11/18/2021   LDLDIRECT 122.2 02/09/2010   LDLCALC 76 11/18/2021   ALT 15 11/18/2021   AST 19  11/18/2021   NA 133 (L) 11/18/2021   K 3.7 11/18/2021   CL 99 11/18/2021   CREATININE 0.93 11/18/2021   BUN 16 11/18/2021   CO2 26 11/18/2021   TSH 2.28 11/18/2021   INR 0.97 05/22/2013   HGBA1C 5.5 11/18/2021   Assessment/Plan:  Crystal Herrera is a 82 y.o. White or Caucasian [1] adult with  has a past medical history of Allergic rhinitis, cause unspecified (01/29/2013), Anemia (1948?), BACK PAIN, CHRONIC (02/09/2010), Cancer ([place]), Cataract, COLONIC POLYPS, HX OF (02/09/2010), COMMON MIGRAINE (02/09/2010), DEGENERATIVE JOINT DISEASE (02/09/2010), DEPRESSION (02/09/2010), GERD (02/09/2010), HYPERLIPIDEMIA (02/09/2010), HYPERTENSION (02/09/2010), Lumbar disc disease, Memory loss, Neuromuscular disorder ([place]), OCCIPITAL NEURALGIA (06/09/2010), OSTEOPENIA (02/09/2010), Pneumonia, and Varicose vein of leg.  Dementia, Alzheimer's, with behavior disturbance ([place]) Overall worsening last 3-6 months now at least moderate it seems, with some onset of very mild verbal aggression per husband, for MRI brain, consider neurology referral, and also add seroquel 25 bid  Hyperlipidemia Lab Results  Component Value Date   LDLCALC 76 11/18/2021   Stable,, pt to continue current statin zocor 20 mg qd, declines lab f/u today   Vitamin D deficiency Last vitamin D Lab Results  Component Value Date   VD25OH 96.09 11/18/2021   Stable, cont oral replacement  Followup: Return in about 6 weeks (around 11/23/2022).  Cathlean Cower, MD 10/15/2022 4:16 PM [facility]

## 2022-10-11 NOTE — Patient Instructions (Addendum)
Please take all new medication as prescribed - the low dose seroquel twice per day  Please continue all other medications as before, and refills have been done if requested.  Please have the pharmacy call with any other refills you may need.  Please continue your efforts at being more active, low cholesterol diet, and weight control.  Please keep your appointments with your specialists as you may have planned  You will be contacted regarding the referral for: MRI  Please make an Appointment to return in Nov 23 2022 as you have planned,, or sooner if needed

## 2022-10-15 ENCOUNTER — Encounter: Payer: Self-pay | Admitting: Internal Medicine

## 2022-10-15 NOTE — Assessment & Plan Note (Signed)
Overall worsening last 3-6 months now at least moderate it seems, with some onset of very mild verbal aggression per husband, for MRI brain, consider neurology referral, and also add seroquel 25 bid

## 2022-10-15 NOTE — Assessment & Plan Note (Signed)
Last vitamin D Lab Results  Component Value Date   VD25OH 96.09 11/18/2021   Stable, cont oral replacement  

## 2022-10-15 NOTE — Assessment & Plan Note (Signed)
Lab Results  Component Value Date   LDLCALC 76 11/18/2021   Stable,, pt to continue current statin zocor 20 mg qd, declines lab f/u today

## 2022-10-18 ENCOUNTER — Telehealth: Payer: Self-pay | Admitting: Internal Medicine

## 2022-10-18 DIAGNOSIS — R413 Other amnesia: Secondary | ICD-10-CM

## 2022-10-18 NOTE — Telephone Encounter (Signed)
Called pt spoke w/ husband he states since wife has a stimulator on her back [facility] told them their equipment is not set up to handle to the stimulator that she need to have done at [place] pt will contact referral to see if they can get this schedule..Johny Chess  "PER RICH-must be transmit/Receive coil only, so this patient can Not have brain MRI's at [facility]"

## 2022-10-18 NOTE — Telephone Encounter (Signed)
Spoke w/Mary she states MD has to order the MRI again location must be [place]..//lmb

## 2022-10-18 NOTE — Telephone Encounter (Signed)
Husband has been notified.Marland KitchenJohny Chess

## 2022-10-18 NOTE — Telephone Encounter (Signed)
Patient's husband called and said there was an issue getting scheduled for a MRI. They need help getting it figured out because the office keeps canceling on them. They would like a call back at [phone].

## 2022-10-18 NOTE — Telephone Encounter (Signed)
Ok this is done 

## 2022-10-23 ENCOUNTER — Other Ambulatory Visit: Payer: Medicare Other

## 2022-11-02 ENCOUNTER — Ambulatory Visit (HOSPITAL_COMMUNITY)
Admission: RE | Admit: 2022-11-02 | Discharge: 2022-11-02 | Disposition: A | Discharge status: Home / Self Care | Payer: Medicare Other | Source: Ambulatory Visit | Attending: Internal Medicine | Admitting: Internal Medicine

## 2022-11-02 ENCOUNTER — Encounter (HOSPITAL_COMMUNITY): Payer: Self-pay

## 2022-11-02 DIAGNOSIS — R413 Other amnesia: Secondary | ICD-10-CM

## 2022-11-05 ENCOUNTER — Other Ambulatory Visit: Payer: Medicare Other

## 2022-11-18 ENCOUNTER — Inpatient Hospital Stay (HOSPITAL_COMMUNITY)
Admission: EM | Admit: 2022-11-18 | Discharge: 2022-11-20 | DRG: 065 | Disposition: A | Discharge status: Home / Self Care | Payer: Medicare Other | Attending: Internal Medicine | Admitting: Internal Medicine

## 2022-11-18 ENCOUNTER — Other Ambulatory Visit: Payer: Self-pay

## 2022-11-18 ENCOUNTER — Encounter (HOSPITAL_COMMUNITY): Payer: Self-pay | Admitting: *Deleted

## 2022-11-18 ENCOUNTER — Other Ambulatory Visit: Payer: Self-pay | Admitting: Neurology

## 2022-11-18 ENCOUNTER — Ambulatory Visit (HOSPITAL_COMMUNITY)
Admission: RE | Admit: 2022-11-18 | Discharge: 2022-11-18 | Disposition: A | Discharge status: Home / Self Care | Payer: Medicare Other | Source: Ambulatory Visit | Attending: Internal Medicine | Admitting: Internal Medicine

## 2022-11-18 ENCOUNTER — Telehealth: Payer: Self-pay | Admitting: Family Medicine

## 2022-11-18 DIAGNOSIS — Z91040 Latex allergy status: Secondary | ICD-10-CM

## 2022-11-18 DIAGNOSIS — Z881 Allergy status to other antibiotic agents status: Secondary | ICD-10-CM | POA: Diagnosis not present

## 2022-11-18 DIAGNOSIS — I6381 Other cerebral infarction due to occlusion or stenosis of small artery: Secondary | ICD-10-CM | POA: Diagnosis not present

## 2022-11-18 DIAGNOSIS — R001 Bradycardia, unspecified: Secondary | ICD-10-CM | POA: Diagnosis not present

## 2022-11-18 DIAGNOSIS — Z882 Allergy status to sulfonamides status: Secondary | ICD-10-CM | POA: Diagnosis not present

## 2022-11-18 DIAGNOSIS — I639 Cerebral infarction, unspecified: Secondary | ICD-10-CM | POA: Diagnosis not present

## 2022-11-18 DIAGNOSIS — E785 Hyperlipidemia, unspecified: Secondary | ICD-10-CM | POA: Diagnosis present

## 2022-11-18 DIAGNOSIS — K219 Gastro-esophageal reflux disease without esophagitis: Secondary | ICD-10-CM | POA: Diagnosis present

## 2022-11-18 DIAGNOSIS — G309 Alzheimer's disease, unspecified: Secondary | ICD-10-CM | POA: Diagnosis present

## 2022-11-18 DIAGNOSIS — F02818 Dementia in other diseases classified elsewhere, unspecified severity, with other behavioral disturbance: Secondary | ICD-10-CM | POA: Diagnosis not present

## 2022-11-18 DIAGNOSIS — Z8673 Personal history of transient ischemic attack (TIA), and cerebral infarction without residual deficits: Secondary | ICD-10-CM

## 2022-11-18 DIAGNOSIS — Z801 Family history of malignant neoplasm of trachea, bronchus and lung: Secondary | ICD-10-CM | POA: Diagnosis not present

## 2022-11-18 DIAGNOSIS — Z87891 Personal history of nicotine dependence: Secondary | ICD-10-CM | POA: Diagnosis not present

## 2022-11-18 DIAGNOSIS — J309 Allergic rhinitis, unspecified: Secondary | ICD-10-CM | POA: Diagnosis present

## 2022-11-18 DIAGNOSIS — I10 Essential (primary) hypertension: Secondary | ICD-10-CM | POA: Diagnosis present

## 2022-11-18 DIAGNOSIS — I6389 Other cerebral infarction: Secondary | ICD-10-CM | POA: Diagnosis not present

## 2022-11-18 DIAGNOSIS — Z803 Family history of malignant neoplasm of breast: Secondary | ICD-10-CM

## 2022-11-18 DIAGNOSIS — Z9682 Presence of neurostimulator: Secondary | ICD-10-CM | POA: Diagnosis not present

## 2022-11-18 DIAGNOSIS — R413 Other amnesia: Secondary | ICD-10-CM | POA: Insufficient documentation

## 2022-11-18 DIAGNOSIS — Z825 Family history of asthma and other chronic lower respiratory diseases: Secondary | ICD-10-CM

## 2022-11-18 DIAGNOSIS — F32A Depression, unspecified: Secondary | ICD-10-CM | POA: Diagnosis present

## 2022-11-18 DIAGNOSIS — I6340 Cerebral infarction due to embolism of unspecified cerebral artery: Secondary | ICD-10-CM | POA: Diagnosis not present

## 2022-11-18 DIAGNOSIS — Z79899 Other long term (current) drug therapy: Secondary | ICD-10-CM

## 2022-11-18 DIAGNOSIS — Z86010 Personal history of colonic polyps: Secondary | ICD-10-CM

## 2022-11-18 DIAGNOSIS — Z880 Allergy status to penicillin: Secondary | ICD-10-CM

## 2022-11-18 DIAGNOSIS — R29702 NIHSS score 2: Secondary | ICD-10-CM | POA: Diagnosis present

## 2022-11-18 DIAGNOSIS — Z808 Family history of malignant neoplasm of other organs or systems: Secondary | ICD-10-CM | POA: Diagnosis not present

## 2022-11-18 DIAGNOSIS — Z888 Allergy status to other drugs, medicaments and biological substances status: Secondary | ICD-10-CM

## 2022-11-18 DIAGNOSIS — I6521 Occlusion and stenosis of right carotid artery: Secondary | ICD-10-CM | POA: Diagnosis not present

## 2022-11-18 DIAGNOSIS — F02A18 Dementia in other diseases classified elsewhere, mild, with other behavioral disturbance: Secondary | ICD-10-CM | POA: Diagnosis present

## 2022-11-18 DIAGNOSIS — Z833 Family history of diabetes mellitus: Secondary | ICD-10-CM | POA: Diagnosis not present

## 2022-11-18 DIAGNOSIS — G319 Degenerative disease of nervous system, unspecified: Secondary | ICD-10-CM | POA: Diagnosis not present

## 2022-11-18 DIAGNOSIS — Z800 Family history of malignant neoplasm of digestive organs: Secondary | ICD-10-CM

## 2022-11-18 LAB — CBC WITH DIFFERENTIAL/PLATELET
Abs Immature Granulocytes: 0.01 10*3/uL (ref 0.00–0.07)
Basophils Absolute: 0 10*3/uL (ref 0.0–0.1)
Basophils Relative: 1 %
Eosinophils Absolute: 0.2 10*3/uL (ref 0.0–0.5)
Eosinophils Relative: 3 %
HCT: 45.1 % (ref 36.0–46.0)
Hemoglobin: 15.2 g/dL — ABNORMAL HIGH (ref 12.0–15.0)
Immature Granulocytes: 0 %
Lymphocytes Relative: 41 %
Lymphs Abs: 2.6 10*3/uL (ref 0.7–4.0)
MCH: 31.7 pg (ref 26.0–34.0)
MCHC: 33.7 g/dL (ref 30.0–36.0)
MCV: 94.2 fL (ref 80.0–100.0)
Monocytes Absolute: 0.5 10*3/uL (ref 0.1–1.0)
Monocytes Relative: 8 %
Neutro Abs: 3 10*3/uL (ref 1.7–7.7)
Neutrophils Relative %: 47 %
Platelets: 218 10*3/uL (ref 150–400)
RBC: 4.79 MIL/uL (ref 3.87–5.11)
RDW: 13.1 % (ref 11.5–15.5)
WBC: 6.4 10*3/uL (ref 4.0–10.5)
nRBC: 0 % (ref 0.0–0.2)

## 2022-11-18 LAB — PROTIME-INR
INR: 1 (ref 0.8–1.2)
Prothrombin Time: 13.3 seconds (ref 11.4–15.2)

## 2022-11-18 LAB — COMPREHENSIVE METABOLIC PANEL
ALT: 16 U/L (ref 0–44)
AST: 23 U/L (ref 15–41)
Albumin: 4.2 g/dL (ref 3.5–5.0)
Alkaline Phosphatase: 45 U/L (ref 38–126)
Anion gap: 9 (ref 5–15)
BUN: 18 mg/dL (ref 8–23)
CO2: 27 mmol/L (ref 22–32)
Calcium: 10.2 mg/dL (ref 8.9–10.3)
Chloride: 102 mmol/L (ref 98–111)
Creatinine, Ser: 0.88 mg/dL (ref 0.44–1.00)
GFR, Estimated: >60 mL/min (ref >60)
Glucose, Bld: 101 mg/dL — ABNORMAL HIGH (ref 70–99)
Potassium: 3.7 mmol/L (ref 3.5–5.1)
Sodium: 138 mmol/L (ref 135–145)
Total Bilirubin: 0.7 mg/dL (ref 0.3–1.2)
Total Protein: 6.8 g/dL (ref 6.5–8.1)

## 2022-11-18 NOTE — ED Notes (Signed)
ED TO INPATIENT HANDOFF REPORT  ED Nurse Name and Phone #: Theadora Rama RN  S Name/Age/Gender Crystal Herrera 82 y.o. female Room/Bed: 021C/021C  Code Status   Code Status: Full Code  Home/SNF/Other Home Patient oriented to: self, place, time, and situation Is this baseline? Yes   Triage Complete: Triage complete  Chief Complaint Acute CVA (cerebrovascular accident) [facility]) [I63.9]  Triage Note The pt has had memory issues for a year  earlier today she had a mri  her doctor called the pts and he told them to come to the ed to be admitted for a workup  pt is alert   Allergies Allergies  Allergen Reactions   Cefuroxime     Might cause hives   Cephalosporins     Might cause hives    Latex Nausea Only   Celexa [Citalopram Hydrobromide] Nausea Only   Dexlansoprazole Anxiety    Sleeplessness heart racing headaches chills   Lexapro [Escitalopram Oxalate] Nausea Only   Penicillins Hives   Sulfonamide Derivatives Hives   Vancomycin Rash    Level of Care/Admitting Diagnosis ED Disposition     ED Disposition  Admit   Condition  --   Comment  Hospital Area: [facility] [100100]  Level of Care: Telemetry Cardiac [103]  May admit patient to [facility] or [facility] if equivalent level of care is available:: No  Covid Evaluation: Asymptomatic - no recent exposure (last 10 days) testing not required  Diagnosis: Acute CVA (cerebrovascular accident) [facility]) [[id number]]  Admitting Physician: Rometta Emery [[id number]]  Attending Physician: Rometta Emery [[id number]]  Certification:: I certify this patient will need inpatient services for at least 2 midnights  Estimated Length of Stay: 3          B Medical/Surgery History Past Medical History:  Diagnosis Date   Allergic rhinitis, cause unspecified 01/29/2013   Anemia 1948?   BACK PAIN, CHRONIC 02/09/2010   Cancer ([facility])    SKIN   Cataract    COLONIC POLYPS, HX OF 02/09/2010   COMMON MIGRAINE 02/09/2010    neuritis in R side of neck & head   DEGENERATIVE JOINT DISEASE 02/09/2010   hands , lumbar stenosis    DEPRESSION 02/09/2010   pt. takes Cymbalta for pain issues    GERD 02/09/2010   HYPERLIPIDEMIA 02/09/2010   HYPERTENSION 02/09/2010   Lumbar disc disease    Memory loss    Neuromuscular disorder ([facility])    neuritis in head & neck   OCCIPITAL NEURALGIA 06/09/2010   OSTEOPENIA 02/09/2010   Pneumonia    "as an infant" (05/15/2012)   Varicose vein of leg    BLE   Past Surgical History:  Procedure Laterality Date   BREAST BIOPSY  1988   left; Neg   CHOLECYSTECTOMY  ? 2009   chymopapain injection  1986   Lumbar   COLONOSCOPY     DILATION AND CURETTAGE OF UTERUS  1960's   HERNIA REPAIR  05/15/2012   ventral incisional    LUMBAR FUSION  05/22/2013   L2  L3 decompression /fusion      Dr Yevette Edwards   LUMBAR LAMINECTOMY  1986   OVARIAN CYST REMOVAL  2006   Stimulator placed   07/2014   placed 2 years ago for pain   TRANSFORAMINAL LUMBAR INTERBODY FUSION (TLIF) WITH PEDICLE SCREW FIXATION 1 LEVEL Left 05/22/2013   Procedure: TRANSFORAMINAL LUMBAR INTERBODY FUSION (TLIF) WITH PEDICLE SCREW FIXATION 1 LEVEL;  Surgeon: Emilee Hero, MD;  Location: [facility];  Service: Orthopedics;  Laterality: Left;  Left sided lumbar 2-3 Transforaminal lumbar interbody fusion with instrumentation, allograft   TUBAL LIGATION  1970's   VARICOSE VEIN SURGERY  ~ 2008   bilaterally   VENTRAL HERNIA REPAIR  05/15/2012   Procedure: LAPAROSCOPIC VENTRAL HERNIA;  Surgeon: Atilano Ina, MD,FACS;  Location: [facility];  Service: General;  Laterality: N/A;  laparoscopic ventral incisional hernia repair with mesh     A IV Location/Drains/Wounds Patient Lines/Drains/Airways Status     Active Line/Drains/Airways     None            Intake/Output Last 24 hours No intake or output data in the 24 hours ending 11/18/22 2354  Labs/Imaging Results for orders placed or performed during the hospital encounter of  11/18/22 (from the past 48 hour(s))  CBC with Differential     Status: Abnormal   Collection Time: 11/18/22  8:23 PM  Result Value Ref Range   WBC 6.4 4.0 - 10.5 K/uL   RBC 4.79 3.87 - 5.11 MIL/uL   Hemoglobin 15.2 (H) 12.0 - 15.0 g/dL   HCT 16.1 09.6 - 04.5 %   MCV 94.2 80.0 - 100.0 fL   MCH 31.7 26.0 - 34.0 pg   MCHC 33.7 30.0 - 36.0 g/dL   RDW 40.9 81.1 - 91.4 %   Platelets 218 150 - 400 K/uL   nRBC 0.0 0.0 - 0.2 %   Neutrophils Relative % 47 %   Neutro Abs 3.0 1.7 - 7.7 K/uL   Lymphocytes Relative 41 %   Lymphs Abs 2.6 0.7 - 4.0 K/uL   Monocytes Relative 8 %   Monocytes Absolute 0.5 0.1 - 1.0 K/uL   Eosinophils Relative 3 %   Eosinophils Absolute 0.2 0.0 - 0.5 K/uL   Basophils Relative 1 %   Basophils Absolute 0.0 0.0 - 0.1 K/uL   Immature Granulocytes 0 %   Abs Immature Granulocytes 0.01 0.00 - 0.07 K/uL    Comment: Performed at [facility], [address] [place], [place] [address]  Comprehensive metabolic panel     Status: Abnormal   Collection Time: 11/18/22  8:23 PM  Result Value Ref Range   Sodium 138 135 - 145 mmol/L   Potassium 3.7 3.5 - 5.1 mmol/L   Chloride 102 98 - 111 mmol/L   CO2 27 22 - 32 mmol/L   Glucose, Bld 101 (H) 70 - 99 mg/dL    Comment: Glucose reference range applies only to samples taken after fasting for at least 8 hours.   BUN 18 8 - 23 mg/dL   Creatinine, Ser 6.21 0.44 - 1.00 mg/dL   Calcium 30.8 8.9 - 65.7 mg/dL   Total Protein 6.8 6.5 - 8.1 g/dL   Albumin 4.2 3.5 - 5.0 g/dL   AST 23 15 - 41 U/L   ALT 16 0 - 44 U/L   Alkaline Phosphatase 45 38 - 126 U/L   Total Bilirubin 0.7 0.3 - 1.2 mg/dL   GFR, Estimated >84 >69 mL/min    Comment: (NOTE) Calculated using the CKD-EPI Creatinine Equation (2021)    Anion gap 9 5 - 15    Comment: Performed at [facility], [address] [place], [place] [address]  Protime-INR     Status: None   Collection Time: 11/18/22  8:23 PM  Result Value Ref Range   Prothrombin Time 13.3 11.4 -  15.2 seconds   INR 1.0 0.8 - 1.2  Comment: (NOTE) INR goal varies based on device and disease states. Performed at [facility], [address] [place], [place] [address]    MR Brain Wo Contrast  Result Date: 11/18/2022 CLINICAL DATA:  Provided history: Memory loss. EXAM: MRI HEAD WITHOUT CONTRAST TECHNIQUE: Multiplanar, multiecho pulse sequences of the brain and surrounding structures were obtained without intravenous contrast. COMPARISON:  No pertinent prior exams available for comparison. FINDINGS: Mild intermittent motion degradation. Brain: Mild-to-moderate for age cerebral atrophy without appreciable lobar predominance. 6 mm acute/subacute lacunar infarct within the left caudate head (series 2, image 27) (series 5, image 19). Multifocal T2 FLAIR hyperintense signal abnormality within the cerebral white matter, nonspecific but compatible with mild chronic small vessel ischemic disease. No evidence of an intracranial mass. No chronic intracranial blood products. No extra-axial fluid collection. No midline shift. Vascular: Maintained flow voids within the proximal large arterial vessels. Skull and upper cervical spine: No focal suspicious marrow lesion. Sinuses/Orbits: No mass or acute finding within the imaged orbits. Prior bilateral ocular lens replacement. No significant paranasal sinus disease. Impression #2 will be called to the ordering clinician or representative by the Radiologist Assistant, and communication documented in the PACS or [employer]. IMPRESSION: 1. Mildly motion degraded exam. 2. 6 mm acute/subacute lacunar infarct within the left caudate head. 3. Mild chronic small vessel ischemic changes within the cerebral white matter. 4. Mild-to-moderate generalized cerebral atrophy. Electronically Signed   By: Jackey Loge D.O.   On: 11/18/2022 18:39    Pending Labs [employer] (From admission, onward)     Start     Ordered   Signed and Held  Lipid panel  (Labs)   Tomorrow morning,   R       Comments: Fasting    Signed and Held   Signed and Held  Hemoglobin A1c  (Labs)  Once,   R       Comments: To assess prior glycemic control    Signed and Held   Signed and Held  CBC  (enoxaparin (LOVENOX)    CrCl >/= 30 ml/min)  Once,   R       Comments: Baseline for enoxaparin therapy IF NOT ALREADY DRAWN.  Notify MD if PLT < 100 K.    Signed and Held   Signed and Held  Creatinine, serum  (enoxaparin (LOVENOX)    CrCl >/= 30 ml/min)  Once,   R       Comments: Baseline for enoxaparin therapy IF NOT ALREADY DRAWN.    Signed and Held   Signed and Held  Creatinine, serum  (enoxaparin (LOVENOX)    CrCl >/= 30 ml/min)  Weekly,   R     Comments: while on enoxaparin therapy    Signed and Held            Vitals/Pain Today's Vitals   11/18/22 2017 11/18/22 2118 11/18/22 2138 11/18/22 2200  BP: (!) 189/83 (!) 184/74  (!) 163/71  Pulse: 64 63  (!) 54  Resp: 14 16  13   Temp: 98.7 F (37.1 C) 98.2 F (36.8 C)    TempSrc: Oral Oral    SpO2: 98% 97%  96%  Weight:      Height:      PainSc:   0-No pain     Isolation Precautions No active isolations  Medications Medications - No data to display  Mobility walks     Focused Assessments Cardiac Assessment Handoff:  Cardiac Rhythm: Sinus bradycardia No results found for: "CKTOTAL", "  CKMB", "CKMBINDEX", "TROPONINI" No results found for: "DDIMER" Does the Patient currently have chest pain? No   , Neuro Assessment Handoff:  Swallow screen pass? Yes  Cardiac Rhythm: Sinus bradycardia NIH Stroke Scale  Level of Consciousness (1a.)   : Alert, keenly responsive LOC Questions (1b. )   : Answers both questions correctly LOC Commands (1c. )   : Performs both tasks correctly Best Gaze (2. )  : Normal Visual (3. )  : No visual loss Facial Palsy (4. )    : Normal symmetrical movements Motor Arm, Left (5a. )   : No drift Motor Arm, Right (5b. ) : No drift Motor Leg, Left (6a. )  : No drift Motor Leg,  Right (6b. ) : No drift Limb Ataxia (7. ): Absent Sensory (8. )  : Normal, no sensory loss Best Language (9. )  : No aphasia Dysarthria (10. ): Normal Extinction/Inattention (11.)   : No Abnormality Complete NIHSS TOTAL: 0     Neuro Assessment:   Neuro Checks:      Has TPA been given? No If patient is a Neuro Trauma and patient is going to OR before floor call report to 4N Charge nurse: [phone] or [phone]  , Pulmonary Assessment Handoff:  Lung sounds:   O2 Device: Room Air      R Recommendations: See Admitting Provider Note  Report given to:   Additional Notes:

## 2022-11-18 NOTE — Telephone Encounter (Signed)
Received page regarding a lacunar infarct on her MRI, acute/subacute. Spoke w pt and her spouse. She has no s/s's other than memory issues which have been longer standing. I explained the concern for another stroke if this is indeed acute and that this would warrant admission if acute. They opted to go to the ER for further evaluation upon my recommendation. All questions answered.

## 2022-11-18 NOTE — ED Provider Triage Note (Signed)
Emergency Medicine Provider Triage Evaluation Note  Crystal Herrera , a 82 y.o. female  was evaluated in triage.  Pt complains of memory problems.  Pt had an outpt mri today.  Pt's primary care advised her to come to ED Review of Systems  Positive: Memory loss Negative: weakness  Physical Exam  There were no vitals taken for this visit. Gen:   Awake, no distress   Resp:  Normal effort  MSK:   Moves extremities without difficulty  Other:    Medical Decision Making  Medically screening exam initiated at 8:11 PM.  Appropriate orders placed.  ELTHA TINGLEY was informed that the remainder of the evaluation will be completed by another provider, this initial triage assessment does not replace that evaluation, and the importance of remaining in the ED until their evaluation is complete.     Elson Areas, [place] 11/18/22 2012

## 2022-11-18 NOTE — H&P (Signed)
History and Physical    Patient: Crystal Herrera:[id number] DOB: 14-Feb-1941 DOA: 11/18/2022 DOS: the patient was seen and examined on 11/18/2022 PCP: Corwin Levins, MD  Patient coming from: {Point_of_Origin:[address]}  Chief Complaint:  Chief Complaint  Patient presents with   memoty issues   HPI: Crystal Herrera is a 82 y.o. female with medical history significant of ***  Review of Systems: {ROS_Text:26778} Past Medical History:  Diagnosis Date   Allergic rhinitis, cause unspecified 01/29/2013   Anemia 1948?   BACK PAIN, CHRONIC 02/09/2010   Cancer (HCC)    SKIN   Cataract    COLONIC POLYPS, HX OF 02/09/2010   COMMON MIGRAINE 02/09/2010   neuritis in R side of neck & head   DEGENERATIVE JOINT DISEASE 02/09/2010   hands , lumbar stenosis    DEPRESSION 02/09/2010   pt. takes Cymbalta for pain issues    GERD 02/09/2010   HYPERLIPIDEMIA 02/09/2010   HYPERTENSION 02/09/2010   Lumbar disc disease    Memory loss    Neuromuscular disorder (HCC)    neuritis in head & neck   OCCIPITAL NEURALGIA 06/09/2010   OSTEOPENIA 02/09/2010   Pneumonia    "as an infant" (05/15/2012)   Varicose vein of leg    BLE   Past Surgical History:  Procedure Laterality Date   BREAST BIOPSY  1988   left; Neg   CHOLECYSTECTOMY  ? 2009   chymopapain injection  1986   Lumbar   COLONOSCOPY     DILATION AND CURETTAGE OF UTERUS  1960's   HERNIA REPAIR  05/15/2012   ventral incisional    LUMBAR FUSION  05/22/2013   L2  L3 decompression /fusion      Dr Yevette Edwards   LUMBAR LAMINECTOMY  1986   OVARIAN CYST REMOVAL  2006   Stimulator placed   07/2014   placed 2 years ago for pain   TRANSFORAMINAL LUMBAR INTERBODY FUSION (TLIF) WITH PEDICLE SCREW FIXATION 1 LEVEL Left 05/22/2013   Procedure: TRANSFORAMINAL LUMBAR INTERBODY FUSION (TLIF) WITH PEDICLE SCREW FIXATION 1 LEVEL;  Surgeon: Emilee Hero, MD;  Location: [facility] OR;  Service: Orthopedics;  Laterality: Left;  Left sided lumbar 2-3 Transforaminal  lumbar interbody fusion with instrumentation, allograft   TUBAL LIGATION  1970's   VARICOSE VEIN SURGERY  ~ 2008   bilaterally   VENTRAL HERNIA REPAIR  05/15/2012   Procedure: LAPAROSCOPIC VENTRAL HERNIA;  Surgeon: Atilano Ina, MD,FACS;  Location: [facility] OR;  Service: General;  Laterality: N/A;  laparoscopic ventral incisional hernia repair with mesh   Social History:  reports that she quit smoking about 51 years ago. Her smoking use included cigarettes. She has a 10.00 pack-year smoking history. She has never used smokeless tobacco. She reports that she does not drink alcohol and does not use drugs.  Allergies  Allergen Reactions   Cefuroxime     Might cause hives   Cephalosporins     Might cause hives    Latex Nausea Only   Celexa [Citalopram Hydrobromide] Nausea Only   Dexlansoprazole Anxiety    Sleeplessness heart racing headaches chills   Lexapro [Escitalopram Oxalate] Nausea Only   Penicillins Hives   Sulfonamide Derivatives Hives   Vancomycin Rash    Family History  Problem Relation Age of Onset   Lung cancer Mother    Cancer Mother        Lung   COPD Mother    Varicose Veins Mother    Diabetes Father    Cancer  Son        Melanoma   Breast cancer Maternal Grandmother    Cancer Maternal Grandmother        breast   Colon cancer Maternal Aunt     Prior to Admission medications   Medication Sig Start Date End Date Taking? Authorizing Provider  Biotin 5000 MCG TABS Take 5,000 mcg by mouth daily.   Yes [provider]  calcium carbonate (OSCAL) 1500 (600 Ca) MG TABS tablet Take 1,200 mg by mouth daily with breakfast.   Yes [provider]  Cholecalciferol (VITAMIN D3 PO) Take 2,000 Units by mouth daily.    Yes [provider]  donepezil (ARICEPT) 10 MG tablet Take 1 tablet (10 mg total) by mouth at bedtime. 11/08/21  Yes Levert Feinstein, MD  memantine (NAMENDA) 10 MG tablet Take 1 tablet (10 mg total) by mouth 2 (two) times daily. 11/08/21  Yes Levert Feinstein, MD  OVER THE COUNTER MEDICATION Take 2 tablets by mouth daily. Youthful Brain   Yes [provider]  OVER THE COUNTER MEDICATION Take 1 packet by mouth daily. Noobru: L theanine: mix 1 packet of powder with water and drink daily   Yes [provider]  QUEtiapine (SEROQUEL) 25 MG tablet Take 1 tablet (25 mg total) by mouth 2 (two) times daily. 10/11/22  Yes Corwin Levins, MD  simvastatin (ZOCOR) 20 MG tablet 1 tab by mouth once daily Patient taking differently: Take 20 mg by mouth daily. 11/18/21  Yes Corwin Levins, MD    Physical Exam: Vitals:   11/18/22 2016 11/18/22 2017 11/18/22 2118 11/18/22 2200  BP:  (!) 189/83 (!) 184/74 (!) 163/71  Pulse:  64 63 (!) 54  Resp:  14 16 13   Temp:  98.7 F (37.1 C) 98.2 F (36.8 C)   TempSrc:  Oral Oral   SpO2:  98% 97% 96%  Weight: 63.5 kg     Height: 5\' 5"  (1.651 m)      *** Data Reviewed: {Tip this will not be part of the note when signed- Document your independent interpretation of telemetry tracing, EKG, lab, Radiology test or any other diagnostic tests. Add any new diagnostic test ordered today. (Optional):26781} {Results:26384}  Assessment and Plan: No notes have been filed under this hospital service. Service: Hospitalist     Advance Care Planning:   Code Status: Full Code ***  Consults: ***  Family Communication: ***  Severity of Illness: {Observation/Inpatient:21159}  AuthorLonia Blood, MD 11/18/2022 11:03 PM  For on call review [email or web address].

## 2022-11-18 NOTE — Consult Note (Signed)
Neurology Consultation Reason for Consult: Stroke on MRI Requesting Physician: Gwyneth Sprout   CC: Stroke on MRI   History is obtained from:***  HPI: Crystal Herrera is a 82 y.o. female ***   LKW: n/a patient is asymptomatic Thrombolytic given?: No, out of the window IA performed?: No, exam not c/w LVO Premorbid modified rankin scale: ***     0 - No symptoms.     1 - No significant disability. Able to carry out all usual activities, despite some symptoms.     2 - Slight disability. Able to look after own affairs without assistance, but unable to carry out all previous activities.     3 - Moderate disability. Requires some help, but able to walk unassisted.     4 - Moderately severe disability. Unable to attend to own bodily needs without assistance, and unable to walk unassisted.     5 - Severe disability. Requires constant nursing care and attention, bedridden, incontinent.     6 - Dead.   ROS: All other review of systems was negative except as noted in the HPI. *** Unable to obtain due to altered mental status.   Past Medical History:  Diagnosis Date   Allergic rhinitis, cause unspecified 01/29/2013   Anemia 1948?   BACK PAIN, CHRONIC 02/09/2010   Cancer (HCC)    SKIN   Cataract    COLONIC POLYPS, HX OF 02/09/2010   COMMON MIGRAINE 02/09/2010   neuritis in R side of neck & head   DEGENERATIVE JOINT DISEASE 02/09/2010   hands , lumbar stenosis    DEPRESSION 02/09/2010   pt. takes Cymbalta for pain issues    GERD 02/09/2010   HYPERLIPIDEMIA 02/09/2010   HYPERTENSION 02/09/2010   Lumbar disc disease    Memory loss    Neuromuscular disorder (HCC)    neuritis in head & neck   OCCIPITAL NEURALGIA 06/09/2010   OSTEOPENIA 02/09/2010   Pneumonia    "as an infant" (05/15/2012)   Varicose vein of leg    BLE   Past Surgical History:  Procedure Laterality Date   BREAST BIOPSY  1988   left; Neg   CHOLECYSTECTOMY  ? 2009   chymopapain injection  1986   Lumbar   COLONOSCOPY      DILATION AND CURETTAGE OF UTERUS  1960's   HERNIA REPAIR  05/15/2012   ventral incisional    LUMBAR FUSION  05/22/2013   L2  L3 decompression /fusion      Dr Yevette Edwards   LUMBAR LAMINECTOMY  1986   OVARIAN CYST REMOVAL  2006   Stimulator placed   07/2014   placed 2 years ago for pain   TRANSFORAMINAL LUMBAR INTERBODY FUSION (TLIF) WITH PEDICLE SCREW FIXATION 1 LEVEL Left 05/22/2013   Procedure: TRANSFORAMINAL LUMBAR INTERBODY FUSION (TLIF) WITH PEDICLE SCREW FIXATION 1 LEVEL;  Surgeon: Emilee Hero, MD;  Location: [facility] OR;  Service: Orthopedics;  Laterality: Left;  Left sided lumbar 2-3 Transforaminal lumbar interbody fusion with instrumentation, allograft   TUBAL LIGATION  1970's   VARICOSE VEIN SURGERY  ~ 2008   bilaterally   VENTRAL HERNIA REPAIR  05/15/2012   Procedure: LAPAROSCOPIC VENTRAL HERNIA;  Surgeon: Atilano Ina, MD,FACS;  Location: [facility] OR;  Service: General;  Laterality: N/A;  laparoscopic ventral incisional hernia repair with mesh   Current Outpatient Medications  Medication Instructions   Biotin 1000 MCG tablet Oral   calcium carbonate (OSCAL) 1500 (600 Ca) MG TABS tablet Oral   Cholecalciferol (VITAMIN D3  PO) 2,000 Units, Oral, Daily   donepezil (ARICEPT) 10 mg, Oral, Daily at bedtime   memantine (NAMENDA) 10 mg, Oral, 2 times daily   QUEtiapine (SEROQUEL) 25 mg, Oral, 2 times daily   simvastatin (ZOCOR) 20 MG tablet 1 tab by mouth once daily     Family History  Problem Relation Age of Onset   Lung cancer Mother    Cancer Mother        Lung   COPD Mother    Varicose Veins Mother    Diabetes Father    Cancer Son        Melanoma   Breast cancer Maternal Grandmother    Cancer Maternal Grandmother        breast   Colon cancer Maternal Aunt     Social History:  reports that she quit smoking about 51 years ago. Her smoking use included cigarettes. She has a 10.00 pack-year smoking history. She has never used smokeless tobacco. She reports that she  does not drink alcohol and does not use drugs.   Exam: Current vital signs: BP (!) 163/71   Pulse (!) 54   Temp 98.2 F (36.8 C) (Oral)   Resp 13   Ht 5\' 5"  (1.651 m)   Wt 63.5 kg   SpO2 96%   BMI 23.30 kg/m  Vital signs in last 24 hours: Temp:  [98.2 F (36.8 C)-98.7 F (37.1 C)] 98.2 F (36.8 C) (04/26 2118) Pulse Rate:  [54-64] 54 (04/26 2200) Resp:  [13-16] 13 (04/26 2200) BP: (163-189)/(71-83) 163/71 (04/26 2200) SpO2:  [96 %-98 %] 96 % (04/26 2200) Weight:  [63.5 kg] 63.5 kg (04/26 2016)   Physical Exam  Constitutional: Appears well-developed and well-nourished.  Psych: Affect appropriate to situation, *** Eyes: No scleral injection HENT: No oropharyngeal obstruction.  MSK: no joint deformities.  Cardiovascular: Normal rate and regular rhythm. *** Perfusing extremities well Respiratory: Effort normal, non-labored breathing GI: Soft.  No distension. There is no tenderness.  Skin: Warm dry and intact visible skin  Neuro: Mental Status: Patient is awake, alert, oriented to person, place, month, year, and situation.*** Patient is able to give a clear and coherent history.*** No signs of aphasia or neglect*** Cranial Nerves: II: Visual Fields are full. Pupils are equal, round, and reactive to light.  *** III,IV, VI: EOMI without ptosis or diploplia.  V: Facial sensation is symmetric to temperature VII: Facial movement is symmetric.  VIII: hearing is intact to voice X: Uvula elevates symmetrically XI: Shoulder shrug is symmetric. XII: tongue is midline without atrophy or fasciculations.  Motor: Tone is normal. Bulk is normal. 5/5 strength was present in all four extremities. *** Sensory: Sensation is symmetric to light touch and temperature in the arms and legs.*** Deep Tendon Reflexes: 2+ and symmetric in the brachioradialis and patellae. *** Plantars: Toes are downgoing bilaterally. *** Cerebellar: FNF and HKS are intact bilaterally*** Gait:  Deferred  in acute setting ***  NIHSS total *** Score breakdown: *** Performed at *** time of patient arrival to ED    I have reviewed labs in epic and the results pertinent to this consultation are:  Basic Metabolic Panel: Recent Labs  Lab 11/18/22 2023  NA 138  K 3.7  CL 102  CO2 27  GLUCOSE 101*  BUN 18  CREATININE 0.88  CALCIUM 10.2    CBC: Recent Labs  Lab 11/18/22 2023  WBC 6.4  NEUTROABS 3.0  HGB 15.2*  HCT 45.1  MCV 94.2  PLT 218  Coagulation Studies: Recent Labs    11/18/22 11/30/2021  LABPROT 13.3  INR 1.0    Lab Results  Component Value Date   HGBA1C 5.5 11/18/2021   Lab Results  Component Value Date   CHOL 161 11/18/2021   HDL 72.80 11/18/2021   LDLCALC 76 11/18/2021   LDLDIRECT 122.2 02/09/2010   TRIG 61.0 11/18/2021   CHOLHDL 2 11/18/2021     I have reviewed the images obtained:***   Impression: ***  Recommendations: - ***   Brooke Dare MD-PhD [facility] [phone]   *** [facility], [facility], [facility]    Total critical care time: *** minutes   Critical care time was exclusive of separately billable procedures and treating other patients.   Critical care was necessary to treat or prevent imminent or life-threatening deterioration.   Critical care was time spent personally by me on the following activities: development of treatment plan with patient and/or surrogate as well as nursing, discussions with consultants/primary team, evaluation of patient's response to treatment, examination of patient, obtaining history from patient or surrogate, ordering and performing treatments and interventions, ordering and review of laboratory studies, ordering and review of radiographic studies, and re-evaluation of patient's condition as needed, as documented above.

## 2022-11-18 NOTE — ED Provider Notes (Signed)
[place] EMERGENCY DEPARTMENT AT [facility] Provider Note   CSN: [id number] Arrival date & time: 11/18/22  2002     History  Chief Complaint  Patient presents with   memoty issues    Crystal Herrera is a 82 y.o. female.  Patient is an 82 year old female with a history of memory problems over the last year, neuritis, depression, hypertension and hyperlipidemia who is presenting today due to abnormal MRI findings.  Patient is present with her husband reports going to see Dr. Jonny Ruiz her PCP because she was having worsening memory issues.  He had ordered an MRI and today they called her stating she had had a recent stroke.  When speaking with the patient and her husband in the last few days they have not noticed any acute changes.  Her memory has been getting worse like all the things and short-term memory but she can still remember her birthdate, her children, thinks she is known for years it is just the more recent memory she cannot remember.  She denies any difficulty swallowing, unilateral numbness, weakness, falls, vision changes or speech difficulty.  The history is provided by the patient, the spouse and medical records.       Home Medications Prior to Admission medications   Medication Sig Start Date End Date Taking? Authorizing Provider  Biotin 1000 MCG tablet Take by mouth.    [provider]  calcium carbonate (OSCAL) 1500 (600 Ca) MG TABS tablet Take by mouth.    [provider]  Cholecalciferol (VITAMIN D3 PO) Take 2,000 Units by mouth daily.     [provider]  donepezil (ARICEPT) 10 MG tablet Take 1 tablet (10 mg total) by mouth at bedtime. 11/08/21   Levert Feinstein, MD  memantine (NAMENDA) 10 MG tablet Take 1 tablet (10 mg total) by mouth 2 (two) times daily. 11/08/21   Levert Feinstein, MD  QUEtiapine (SEROQUEL) 25 MG tablet Take 1 tablet (25 mg total) by mouth 2 (two) times daily. 10/11/22   Corwin Levins, MD  simvastatin (ZOCOR) 20 MG  tablet 1 tab by mouth once daily 11/18/21   Corwin Levins, MD      Allergies    Cefuroxime, Cephalosporins, Latex, Celexa [citalopram hydrobromide], Dexlansoprazole, Lexapro [escitalopram oxalate], Penicillins, Sulfonamide derivatives, and Vancomycin    Review of Systems   Review of Systems  Physical Exam Updated Vital Signs BP (!) 184/74 (BP Location: Right Arm)   Pulse 63   Temp 98.2 F (36.8 C) (Oral)   Resp 16   Ht 5\' 5"  (1.651 m)   Wt 63.5 kg   SpO2 97%   BMI 23.30 kg/m  Physical Exam Vitals and nursing note reviewed.  Constitutional:      General: She is not in acute distress.    Appearance: She is well-developed.  HENT:     Head: Normocephalic and atraumatic.  Eyes:     Pupils: Pupils are equal, round, and reactive to light.  Cardiovascular:     Rate and Rhythm: Normal rate and regular rhythm.     Heart sounds: Normal heart sounds. No murmur heard.    No friction rub.  Pulmonary:     Effort: Pulmonary effort is normal.     Breath sounds: Normal breath sounds. No wheezing or rales.  Abdominal:     General: Bowel sounds are normal. There is no distension.     Palpations: Abdomen is soft.     Tenderness: There is no abdominal tenderness. There  is no guarding or rebound.  Musculoskeletal:        General: No tenderness. Normal range of motion.     Comments: No edema  Skin:    General: Skin is warm and dry.     Findings: No rash.  Neurological:     Mental Status: She is alert and oriented to person, place, and time.     Cranial Nerves: No cranial nerve deficit or dysarthria.     Sensory: Sensation is intact. No sensory deficit.     Motor: Motor function is intact. No weakness.     Coordination: Coordination is intact. Coordination normal. Finger-Nose-Finger Test and Heel to [facility] Test normal.     Gait: Gait is intact.  Psychiatric:        Behavior: Behavior normal.     ED Results / Procedures / Treatments   Labs (all labs ordered are listed, but only  abnormal results are displayed) Labs Reviewed  CBC WITH DIFFERENTIAL/PLATELET - Abnormal; Notable for the following components:      Result Value   Hemoglobin 15.2 (*)    All other components within normal limits  COMPREHENSIVE METABOLIC PANEL - Abnormal; Notable for the following components:   Glucose, Bld 101 (*)    All other components within normal limits  PROTIME-INR    EKG None ED ECG REPORT   Date: 11/18/2022  Rate: 56  Rhythm: normal sinus rhythm  QRS Axis: normal  Intervals: normal  ST/T Wave abnormalities: normal  Conduction Disutrbances:none  Narrative Interpretation:   Old EKG Reviewed: none available  I have personally reviewed the EKG tracing and agree with the computerized printout as noted.   Radiology MR Brain Wo Contrast  Result Date: 11/18/2022 CLINICAL DATA:  Provided history: Memory loss. EXAM: MRI HEAD WITHOUT CONTRAST TECHNIQUE: Multiplanar, multiecho pulse sequences of the brain and surrounding structures were obtained without intravenous contrast. COMPARISON:  No pertinent prior exams available for comparison. FINDINGS: Mild intermittent motion degradation. Brain: Mild-to-moderate for age cerebral atrophy without appreciable lobar predominance. 6 mm acute/subacute lacunar infarct within the left caudate head (series 2, image 27) (series 5, image 19). Multifocal T2 FLAIR hyperintense signal abnormality within the cerebral white matter, nonspecific but compatible with mild chronic small vessel ischemic disease. No evidence of an intracranial mass. No chronic intracranial blood products. No extra-axial fluid collection. No midline shift. Vascular: Maintained flow voids within the proximal large arterial vessels. Skull and upper cervical spine: No focal suspicious marrow lesion. Sinuses/Orbits: No mass or acute finding within the imaged orbits. Prior bilateral ocular lens replacement. No significant paranasal sinus disease. Impression #2 will be called to the  ordering clinician or representative by the Radiologist Assistant, and communication documented in the PACS or [employer]. IMPRESSION: 1. Mildly motion degraded exam. 2. 6 mm acute/subacute lacunar infarct within the left caudate head. 3. Mild chronic small vessel ischemic changes within the cerebral white matter. 4. Mild-to-moderate generalized cerebral atrophy. Electronically Signed   By: Jackey Loge D.O.   On: 11/18/2022 18:39    Procedures Procedures    Medications Ordered in ED Medications - No data to display  ED Course/ Medical Decision Making/ A&P                             Medical Decision Making Amount and/or Complexity of Data Reviewed Independent Historian: spouse External Data Reviewed: notes. Labs: ordered. Decision-making details documented in ED Course. Radiology:  Decision-making details documented in  ED Course. ECG/medicine tests: ordered and independent interpretation performed. Decision-making details documented in ED Course.  Risk Decision regarding hospitalization.   Pt with multiple medical problems and comorbidities and presenting today with a complaint that caries a high risk for morbidity and mortality.  Here today with an MRI that showed a subacute to acute infarct in the left caudate head.  Patient has no focal findings on her exam today.  She is hypertensive but otherwise the rest of her vital signs are normal.  I independently interpreted patient's EKG and labs.  EKG is within normal limits, CBC, CMP, coags are within normal limits.  Will discuss with neurology. Spoke with hospitalist who will admit the patient.  Pt and spouse comfortable with this plan         Final Clinical Impression(s) / ED Diagnoses Final diagnoses:  Cerebrovascular accident (CVA), unspecified mechanism [facility])    Rx / DC Orders ED Discharge Orders     None         Gwyneth Sprout, MD 11/18/22 2236

## 2022-11-18 NOTE — ED Triage Notes (Signed)
The pt has had memory issues for a year  earlier today she had a mri  her doctor called the pts and he told them to come to the ed to be admitted for a workup  pt is alert

## 2022-11-19 ENCOUNTER — Inpatient Hospital Stay (HOSPITAL_COMMUNITY): Payer: Medicare Other

## 2022-11-19 DIAGNOSIS — I639 Cerebral infarction, unspecified: Secondary | ICD-10-CM | POA: Diagnosis not present

## 2022-11-19 LAB — CREATININE, SERUM
Creatinine, Ser: 0.84 mg/dL (ref 0.44–1.00)
GFR, Estimated: >60 mL/min (ref >60)

## 2022-11-19 LAB — VITAMIN D 25 HYDROXY (VIT D DEFICIENCY, FRACTURES): Vit D, 25-Hydroxy: 59.56 ng/mL (ref 30–100)

## 2022-11-19 LAB — CBC
HCT: 41.5 % (ref 36.0–46.0)
Hemoglobin: 13.8 g/dL (ref 12.0–15.0)
MCH: 31.6 pg (ref 26.0–34.0)
MCHC: 33.3 g/dL (ref 30.0–36.0)
MCV: 95 fL (ref 80.0–100.0)
Platelets: 196 10*3/uL (ref 150–400)
RBC: 4.37 MIL/uL (ref 3.87–5.11)
RDW: 13 % (ref 11.5–15.5)
WBC: 5.1 10*3/uL (ref 4.0–10.5)
nRBC: 0 % (ref 0.0–0.2)

## 2022-11-19 LAB — LIPID PANEL
Cholesterol: 160 mg/dL (ref 0–200)
HDL: 62 mg/dL (ref >40)
LDL Cholesterol: 89 mg/dL (ref 0–99)
Total CHOL/HDL Ratio: 2.6 RATIO
Triglycerides: 46 mg/dL (ref <150)
VLDL: 9 mg/dL (ref 0–40)

## 2022-11-19 LAB — RPR: RPR Ser Ql: NONREACTIVE

## 2022-11-19 LAB — HEMOGLOBIN A1C
Hgb A1c MFr Bld: 5.3 % (ref 4.8–5.6)
Mean Plasma Glucose: 105.41 mg/dL

## 2022-11-19 LAB — HIV ANTIBODY (ROUTINE TESTING W REFLEX): HIV Screen 4th Generation wRfx: NONREACTIVE

## 2022-11-19 LAB — VITAMIN B12: Vitamin B-12: 653 pg/mL (ref 180–914)

## 2022-11-19 LAB — TSH: TSH: 3.832 u[IU]/mL (ref 0.350–4.500)

## 2022-11-19 MED ORDER — ASPIRIN 81 MG PO TBEC
81.0000 mg | DELAYED_RELEASE_TABLET | Freq: Every day | ORAL | Status: DC
  Administered 2022-11-19 – 2022-11-20 (×2): 81 mg via ORAL
  Filled 2022-11-19 (×2): qty 1

## 2022-11-19 MED ORDER — MEMANTINE HCL 10 MG PO TABS
10.0000 mg | ORAL_TABLET | Freq: Two times a day (BID) | ORAL | Status: DC
  Administered 2022-11-19 – 2022-11-20 (×4): 10 mg via ORAL
  Filled 2022-11-19 (×4): qty 1

## 2022-11-19 MED ORDER — QUETIAPINE FUMARATE 25 MG PO TABS
25.0000 mg | ORAL_TABLET | Freq: Two times a day (BID) | ORAL | Status: DC
  Administered 2022-11-19 – 2022-11-20 (×4): 25 mg via ORAL
  Filled 2022-11-19 (×4): qty 1

## 2022-11-19 MED ORDER — ENOXAPARIN SODIUM 40 MG/0.4ML IJ SOSY
40.0000 mg | PREFILLED_SYRINGE | INTRAMUSCULAR | Status: DC
  Administered 2022-11-19 – 2022-11-20 (×2): 40 mg via SUBCUTANEOUS
  Filled 2022-11-19 (×2): qty 0.4

## 2022-11-19 MED ORDER — SODIUM CHLORIDE 0.9 % IV SOLN: INTRAVENOUS | Status: DC

## 2022-11-19 MED ORDER — DONEPEZIL HCL 10 MG PO TABS
10.0000 mg | ORAL_TABLET | Freq: Every day | ORAL | Status: DC
  Administered 2022-11-19 (×2): 10 mg via ORAL
  Filled 2022-11-19 (×2): qty 1

## 2022-11-19 MED ORDER — ACETAMINOPHEN 650 MG RE SUPP: 650.0000 mg | RECTAL | Status: DC | PRN

## 2022-11-19 MED ORDER — CLOPIDOGREL BISULFATE 75 MG PO TABS
75.0000 mg | ORAL_TABLET | Freq: Every day | ORAL | Status: DC
  Administered 2022-11-19 – 2022-11-20 (×2): 75 mg via ORAL
  Filled 2022-11-19 (×2): qty 1

## 2022-11-19 MED ORDER — VITAMIN D 25 MCG (1000 UNIT) PO TABS
2000.0000 [IU] | ORAL_TABLET | Freq: Every day | ORAL | Status: DC
  Administered 2022-11-19 – 2022-11-20 (×2): 2000 [IU] via ORAL
  Filled 2022-11-19 (×2): qty 2

## 2022-11-19 MED ORDER — ACETAMINOPHEN 325 MG PO TABS: 650.0000 mg | ORAL_TABLET | ORAL | Status: DC | PRN

## 2022-11-19 MED ORDER — ORAL CARE MOUTH RINSE: 15.0000 mL | OROMUCOSAL | Status: DC | PRN

## 2022-11-19 MED ORDER — VITAMIN B-12 1000 MCG PO TABS
1000.0000 ug | ORAL_TABLET | Freq: Every day | ORAL | Status: DC
  Administered 2022-11-19 – 2022-11-20 (×2): 1000 ug via ORAL
  Filled 2022-11-19 (×2): qty 1

## 2022-11-19 MED ORDER — ATORVASTATIN CALCIUM 40 MG PO TABS
40.0000 mg | ORAL_TABLET | Freq: Every day | ORAL | Status: DC
  Administered 2022-11-19 – 2022-11-20 (×2): 40 mg via ORAL
  Filled 2022-11-19 (×2): qty 1

## 2022-11-19 MED ORDER — SENNOSIDES-DOCUSATE SODIUM 8.6-50 MG PO TABS: 1.0000 | ORAL_TABLET | Freq: Every evening | ORAL | Status: DC | PRN

## 2022-11-19 MED ORDER — IOHEXOL 350 MG/ML SOLN
75.0000 mL | Freq: Once | INTRAVENOUS | Status: AC | PRN
  Administered 2022-11-19: 75 mL via INTRAVENOUS

## 2022-11-19 MED ORDER — ACETAMINOPHEN 160 MG/5ML PO SOLN: 650.0000 mg | ORAL | Status: DC | PRN

## 2022-11-19 MED ORDER — STROKE: EARLY STAGES OF RECOVERY BOOK
Freq: Once | Status: AC
  Filled 2022-11-19: qty 1

## 2022-11-19 NOTE — Evaluation (Signed)
Physical Therapy Evaluation & Discharge Patient Details Name: SOLAE NORLING MRN: [id number] DOB: [birth year] Today's Date: 11/19/2022  History of Present Illness  Pt is an 82 y.o. female who presented 11/18/22 as referral from PCP due to worsening memory issues and MRI revealing a 6 mm acute/subacute lacunar infarct within the left caudate head. PMH: HLD, Alzheimer's disease, lumbar degenerative disc disease s/p spinal cord stimulator, remote 10-pack-year smoking history (quit 1950), anemia, cancer, cataract, HTN, osteopenia   Clinical Impression  Pt presents with condition above. PTA, she was independent without DME, living with her husband in a 2-level house with x10 STE and x ~15 stairs to access her bedroom/bathroom once inside the house. Pt demonstrates WFL, intact, and fairly symmetrical bil lower extremity strength, sensation, and coordination. She was able to perform all functional mobility, including navigating stairs, without UE support or assistance. She did display x2 minor staggers when ambulating while dual-tasking but recovered without intervention. She appears to be near if not at her functional mobility baseline. Her primary deficit is her cognition and word finding capabilities. All education completed and questions answered. PT will sign off.       Recommendations for follow up therapy are one component of a multi-disciplinary discharge planning process, led by the attending physician.  Recommendations may be updated based on patient status, additional functional criteria and insurance authorization.  Follow Up Recommendations       Assistance Recommended at Discharge PRN  Patient can return home with the following  Assistance with cooking/housework;Direct supervision/assist for medications management;Direct supervision/assist for financial management;Assist for transportation    Equipment Recommendations None recommended by PT  Recommendations for Other Services        Functional Status Assessment Patient has not had a recent decline in their functional status     Precautions / Restrictions Precautions Precautions: Fall Precaution Comments: STM deficits Restrictions Weight Bearing Restrictions: No      Mobility  Bed Mobility Overal bed mobility: Independent             General bed mobility comments: Bed flat, rails down, no assistance needed    Transfers Overall transfer level: Independent Equipment used: None               General transfer comment: No assistance needed, no LOB coming to stand from EOB    Ambulation/Gait Ambulation/Gait assistance: Independent Gait Distance (Feet): 500 Feet Assistive device: None Gait Pattern/deviations: Step-through pattern Gait velocity: slightly reduced Gait velocity interpretation: 1.31 - 2.62 ft/sec, indicative of limited community ambulator   General Gait Details: Pt with steady gait majority of time. Two slight staggers when dividing attention during mobility without an assistive device, but pt able to correct immediately without intervention from therapist.  Stairs Stairs: Yes Stairs assistance: Supervision Stair Management: No rails, Alternating pattern, Forwards Number of Stairs: 5 (x2 standard height, x3 short height) General stair comments: Ascends and descends without assistance or LOB, supervision for safety  Wheelchair Mobility    Modified Rankin (Stroke Patients Only) Modified Rankin (Stroke Patients Only) Pre-Morbid Rankin Score: No significant disability Modified Rankin: Slight disability     Balance Overall balance assessment: Mild deficits observed, not formally tested                                           Pertinent Vitals/Pain Pain Assessment Pain Assessment: No/denies pain    Home Living  Family/patient expects to be discharged to:: Private residence Living Arrangements: Spouse/significant other Available Help at Discharge:  Family;Available 24 hours/day Type of Home: House Home Access: Stairs to enter Entrance Stairs-Rails: None Entrance Stairs-Number of Steps: 10 Alternate Level Stairs-Number of Steps: 15 Home Layout: Two level;Bed/bath upstairs;Able to live on main level with bedroom/bathroom Home Equipment: None      Prior Function Prior Level of Function : Independent/Modified Independent             Mobility Comments: No AD ADLs Comments: Uses pill box, but manages it herself; husband drives; she does cooking and cleaning, husband assists if needed     Hand Dominance   Dominant Hand: Right    Extremity/Trunk Assessment   Upper Extremity Assessment Upper Extremity Assessment: Defer to OT evaluation LUE Deficits / Details: shoulder flexion 3+/5 with report of pain, decreased internal rotation noted when reaching behind her back.  All other joints AROM WFLs and strength 5/5 LUE: Shoulder pain with ROM LUE Sensation: WNL LUE Coordination: WNL    Lower Extremity Assessment Lower Extremity Assessment: Overall WFL for tasks assessed (MMT scores of 4+ to 5 grossly bil; coordination grossly intact bil; sensation intact bil)    Cervical / Trunk Assessment Cervical / Trunk Assessment: Normal  Communication   Communication: Expressive difficulties (slight word finding difficulty)  Cognition Arousal/Alertness: Awake/alert Behavior During Therapy: WFL for tasks assessed/performed Overall Cognitive Status: Impaired/Different from baseline Area of Impairment: Orientation, Awareness                 Orientation Level: Time, Situation, Disoriented to (stated August initially and was not aware she had CVA)         Awareness: Intellectual   General Comments: Some slight word finding difficulty noted throughout session as well.        General Comments      Exercises     Assessment/Plan    PT Assessment Patient does not need any further PT services  PT Problem List         PT  Treatment Interventions      PT Goals (Current goals can be found in the Care Plan section)  Acute Rehab PT Goals Patient Stated Goal: to improve her cognition PT Goal Formulation: All assessment and education complete, DC therapy Time For Goal Achievement: 11/20/22 Potential to Achieve Goals: Good    Frequency       Co-evaluation PT/OT/SLP Co-Evaluation/Treatment: Yes Reason for Co-Treatment: For patient/therapist safety;To address functional/ADL transfers;Other (comment) (unsure if pt would be agitated and follow cues due to comments in notes in regards to this) PT goals addressed during session: Mobility/safety with mobility;Balance         AM-PAC PT "6 Clicks" Mobility  Outcome Measure Help needed turning from your back to your side while in a flat bed without using bedrails?: None Help needed moving from lying on your back to sitting on the side of a flat bed without using bedrails?: None Help needed moving to and from a bed to a chair (including a wheelchair)?: None Help needed standing up from a chair using your arms (e.g., wheelchair or bedside chair)?: None Help needed to walk in hospital room?: None Help needed climbing 3-5 steps with a railing? : A Little 6 Click Score: 23    End of Session Equipment Utilized During Treatment: Gait belt Activity Tolerance: Patient tolerated treatment well Patient left: in chair;with call bell/phone within reach;with chair alarm set   PT Visit Diagnosis: Unsteadiness on feet (  R26.81);Other (comment) (memory deficits)    Time: 1610-9604 PT Time Calculation (min) (ACUTE ONLY): 33 min   Charges:   PT Evaluation $PT Eval Moderate Complexity: 1 Mod          Raymond Gurney, PT, DPT Acute Rehabilitation Services  Office: [phone]   Jewel Baize 11/19/2022, 1:55 PM

## 2022-11-19 NOTE — Evaluation (Signed)
Occupational Therapy Evaluation Patient Details Name: Crystal Herrera MRN: [id number] DOB: [birth year] Today's Date: 11/19/2022   History of Present Illness Pt is an 82 y.o. female who presented 11/18/22 as referral from PCP due to worsening memory issues and MRI revealing a 6 mm acute/subacute lacunar infarct within the left caudate head. PMH: HLD, Alzheimer's disease, lumbar degenerative disc disease s/p spinal cord stimulator, remote 10-pack-year smoking history (quit 1950), anemia, cancer, cataract, HTN, osteopenia   Clinical Impression   Pt currently completes selfcare tasks at a supervision level for safety.  No assistive device or DME needed throughout but pt exhibiting some significant short term memory deficits with some occasional word finding trouble.  She will have 24 hr supervision from her spouse at home for safety per her report.  He was not present during session however.  Feel she will benefit from another acute OT visit to help provide education to pt and spouse on memory deficits and need for supervision with home management tasks as well as compensation techniques that can be incorporated.        Recommendations for follow up therapy are one component of a multi-disciplinary discharge planning process, led by the attending physician.  Recommendations may be updated based on patient status, additional functional criteria and insurance authorization.   Assistance Recommended at Discharge PRN  Patient can return home with the following Assistance with cooking/housework;Direct supervision/assist for medications management;Assist for transportation    Functional Status Assessment  Patient has had a recent decline in their functional status and demonstrates the ability to make significant improvements in function in a reasonable and predictable amount of time.  Equipment Recommendations  Other (comment);None recommended by OT       Precautions / Restrictions  Precautions Precaution Comments: STM deficits Restrictions Weight Bearing Restrictions: No      Mobility Bed Mobility Overal bed mobility: Modified Independent                  Transfers Overall transfer level: Modified independent Equipment used: None               General transfer comment: No assistive device with mobility.  Two slight staggers when dividing attention during mobility without an assistive device, but pt able to correct immediately without intervention from therapist.      Balance Overall balance assessment: Mild deficits observed, not formally tested                                         ADL either performed or assessed with clinical judgement   ADL Overall ADL's : Needs assistance/impaired Eating/Feeding: Independent;Sitting   Grooming: Set up;Standing Grooming Details (indicate cue type and reason): simulated Upper Body Bathing: Set up;Sitting   Lower Body Bathing: Supervison/ safety;Sit to/from stand Lower Body Bathing Details (indicate cue type and reason): simulated Upper Body Dressing : Set up;Sitting Upper Body Dressing Details (indicate cue type and reason): simulated Lower Body Dressing: Supervision/safety;Sit to/from stand Lower Body Dressing Details (indicate cue type and reason): simulated Toilet Transfer: Supervision/safety;Ambulation Toilet Transfer Details (indicate cue type and reason): simulated Toileting- Clothing Manipulation and Hygiene: Supervision/safety;Sit to/from stand Toileting - Clothing Manipulation Details (indicate cue type and reason): simulated Tub/ Shower Transfer: Supervision/safety;Ambulation   Functional mobility during ADLs: Supervision/safety General ADL Comments: Pt with decreased ability to recall her room number when ambulating around the unit and incorporating distraction of conversation.  She also needed mod questioning cueing to recall events completed during session (stair  training, tub transfer, picking item up off of the floor).  She reports completing most of the cooking at home.  Feel she will likely need closer supervision with these tasks at home secondary to memory impairment.     Vision Baseline Vision/History: 1 Wears glasses Ability to See in Adequate Light: 0 Adequate Patient Visual Report: No change from baseline Vision Assessment?: No apparent visual deficits Additional Comments: Pt reports no issues, able to read the clock in the room without difficulty.     Perception  WFL   Praxis  [facility]    Pertinent Vitals/Pain Pain Assessment Pain Assessment: No/denies pain     Hand Dominance Right   Extremity/Trunk Assessment Upper Extremity Assessment Upper Extremity Assessment: LUE deficits/detail LUE Deficits / Details: shoulder flexion 3+/5 with report of pain, decreased internal rotation noted when reaching behind her back.  All other joints AROM WFLs and strength 5/5 LUE: Shoulder pain with ROM LUE Sensation: WNL LUE Coordination: WNL   Lower Extremity Assessment Lower Extremity Assessment: Defer to PT evaluation   Cervical / Trunk Assessment Cervical / Trunk Assessment: Normal   Communication Communication Communication: Expressive difficulties   Cognition Arousal/Alertness: Awake/alert Behavior During Therapy: WFL for tasks assessed/performed Overall Cognitive Status: Impaired/Different from baseline Area of Impairment: Orientation, Awareness                 Orientation Level: Time, Situation, Disoriented to (Stated August initially and then was not aware of having CVA.)         Awareness: Intellectual   General Comments: Some slight word finding difficulty noted throughout session as well.                Home Living Family/patient expects to be discharged to:: Private residence Living Arrangements: Spouse/significant other Available Help at Discharge: Family;Available 24 hours/day Type of Home: House Home  Access: Stairs to enter [employer] of Steps: 10 Entrance Stairs-Rails: None Home Layout: Two level;Bed/bath upstairs;Able to live on main level with bedroom/bathroom Alternate Level Stairs-Number of Steps: 15 Alternate Level Stairs-Rails: Right Bathroom Shower/Tub: Chief Strategy Officer: Handicapped height     Home Equipment: None          Prior Functioning/Environment Prior Level of Function : Independent/Modified Independent             Mobility Comments: No AD ADLs Comments: Uses pill box, but manages it herself; husband drives; she does cooking and cleaning, husband assists if needed        OT Problem List: Decreased safety awareness;Decreased cognition;Decreased knowledge of use of DME or AE      OT Treatment/Interventions: Self-care/ADL training;Patient/family education;Balance training;Therapeutic activities;Cognitive remediation/compensation;DME and/or AE instruction;Neuromuscular education    OT Goals(Current goals can be found in the care plan section) Acute Rehab OT Goals Patient Stated Goal: Pt did not state but agreeable to getting OOB and working with therapist. OT Goal Formulation: With patient Time For Goal Achievement: 11/26/22 Potential to Achieve Goals: Good  OT Frequency: Min 2X/week       AM-PAC OT "6 Clicks" Daily Activity     Outcome Measure Help from another person eating meals?: None Help from another person taking care of personal grooming?: None Help from another person toileting, which includes using toliet, bedpan, or urinal?: None Help from another person bathing (including washing, rinsing, drying)?: A Little Help from another person to put on and taking off regular upper  body clothing?: None Help from another person to put on and taking off regular lower body clothing?: None 6 Click Score: 23   End of Session Equipment Utilized During Treatment: Gait belt Nurse Communication: Mobility status  Activity  Tolerance: Patient tolerated treatment well Patient left: in chair;with call bell/phone within reach;with chair alarm set  OT Visit Diagnosis: Other symptoms and signs involving cognitive function;Unsteadiness on feet (R26.81)                Time: 9147-8295 OT Time Calculation (min): 31 min Charges:  OT General Charges $OT Visit: 1 Visit OT Evaluation $OT Eval Moderate Complexity: 1 Mod  Perrin Maltese, OTR/L Acute Rehabilitation Services  Office [phone] 11/19/2022

## 2022-11-19 NOTE — Plan of Care (Signed)
  Problem: Education: Goal: Knowledge of disease or condition will improve Outcome: Progressing   Problem: Self-Care: Goal: Ability to communicate needs accurately will improve Outcome: Progressing   Problem: Nutrition: Goal: Risk of aspiration will decrease Outcome: Progressing Goal: Dietary intake will improve Outcome: Progressing   Problem: Clinical Measurements: Goal: Will remain free from infection Outcome: Progressing Goal: Respiratory complications will improve Outcome: Progressing Goal: Cardiovascular complication will be avoided Outcome: Progressing

## 2022-11-19 NOTE — Progress Notes (Signed)
PROGRESS NOTE    Crystal Herrera  ZOX:[id number] DOB: 1941-04-05 DOA: 11/18/2022 PCP: Corwin Levins, MD Brief Narrative: Patient came to the ER for abnormal MRI brain findings.  Patient was being worked up as an outpatient for increasing memory loss and MRI of the brain was done as a part of that.  It came back positive for acute to subacute lacunar stroke her PCP advised her to come to the ER. She denies dysphagia, dysarthria, headaches or changes with her vision.  She is able to walk to the bathroom by herself. Her past medical history significant for prior tobacco use, degenerative joint disease hyperlipidemia and dementia.  Lives with her husband at home. MRI brain showed  6 mm acute/subacute lacunar infarct within the left caudate head. Mild chronic small vessel ischemic changes within the cerebral white matter. Mild-to-moderate generalized cerebral atrophy. A1c 5.4 LDL 89 Echo pending ct angio no lvo Normal b12 tsh Rpr hiv  thiamine pending  Assessment & Plan:   Principal Problem:   Acute CVA (cerebrovascular accident) (HCC) Active Problems:   Hyperlipidemia   Allergic rhinitis   Dementia, Alzheimer's, with behavior disturbance (HCC)  #1 lacunar stroke patient presented with memory issues Which is gradually worsening in the setting of dementia. Consulted speech and PT. On aspirin Plavix and statin Echo pending CT angiogram of the head and neck with no large vessel occlusion  #2 dementia on Namenda and Aricept at home.  #3 hyperlipidemia on statin LDL 89    Estimated body mass index is 23.3 kg/m as calculated from the following:   Height as of this encounter: 5\' 5"  (1.651 m).   Weight as of this encounter: 63.5 kg.  DVT prophylaxis: Lovenox  code Status: Full  family Communication: Discussed with husband at bedside disposition Plan:  Status is: Inpatient Remains inpatient appropriate because: Acute stroke workup pending   Consultants:  Neurology  Procedures:  None Antimicrobials: None Subjective: She is resting in bed nurse reports no overnight events she is getting IV fluids at 100 cc an hour waiting for speech therapy to see her to start her on a diet.  Husband at the bedside.  I went over the CT scans and labs with him.  Objective: Vitals:   11/19/22 0028 11/19/22 0118 11/19/22 0405 11/19/22 0747  BP:  (!) 161/73 133/64 139/67  Pulse:  (!) 59 (!) 55 61  Resp:   16 16  Temp:  (!) 97.4 F (36.3 C) 97.9 F (36.6 C) 97.8 F (36.6 C)  TempSrc:  Oral Oral Oral  SpO2: 96% 96% 98% 99%  Weight:      Height:        Intake/Output Summary (Last 24 hours) at 11/19/2022 1105 Last data filed at 11/19/2022 0600 Gross per 24 hour  Intake 406.77 ml  Output --  Net 406.77 ml   Filed Weights   11/18/22 2016  Weight: 63.5 kg    Examination:  General exam: Appears in no acute distress  respiratory system: Clear to auscultation. Respiratory effort normal. Cardiovascular system: S1 & S2 heard, RRR. No JVD, murmurs, rubs, gallops or clicks. No pedal edema. Gastrointestinal system: Abdomen is nondistended, soft and nontender. No organomegaly or masses felt. Normal bowel sounds heard. Central nervous system: Awake alert and oriented to hospital extremities: No edema   Data Reviewed: I have personally reviewed following labs and imaging studies  CBC: Recent Labs  Lab 11/18/22 2023 11/19/22 0335  WBC 6.4 5.1  NEUTROABS 3.0  --  HGB 15.2* 13.8  HCT 45.1 41.5  MCV 94.2 95.0  PLT 218 196   Basic Metabolic Panel: Recent Labs  Lab 11/18/22 2023 11/19/22 0335  NA 138  --   K 3.7  --   CL 102  --   CO2 27  --   GLUCOSE 101*  --   BUN 18  --   CREATININE 0.88 0.84  CALCIUM 10.2  --    GFR: Estimated Creatinine Clearance: 46.5 mL/min (by C-G formula based on SCr of 0.84 mg/dL). Liver Function Tests: Recent Labs  Lab 11/18/22 2023  AST 23  ALT 16  ALKPHOS 45  BILITOT 0.7  PROT 6.8  ALBUMIN 4.2   No results for input(s):  "LIPASE", "AMYLASE" in the last 168 hours. No results for input(s): "AMMONIA" in the last 168 hours. Coagulation Profile: Recent Labs  Lab 11/18/22 2023  INR 1.0   Cardiac Enzymes: No results for input(s): "CKTOTAL", "CKMB", "CKMBINDEX", "TROPONINI" in the last 168 hours. BNP (last 3 results) No results for input(s): "PROBNP" in the last 8760 hours. HbA1C: Recent Labs    11/19/22 0335  HGBA1C 5.3   CBG: No results for input(s): "GLUCAP" in the last 168 hours. Lipid Profile: Recent Labs    11/19/22 0335  CHOL 160  HDL 62  LDLCALC 89  TRIG 46  CHOLHDL 2.6   Thyroid Function Tests: Recent Labs    11/19/22 0335  TSH 3.832   Anemia Panel: Recent Labs    11/19/22 0335  VITAMINB12 653   Sepsis Labs: No results for input(s): "PROCALCITON", "LATICACIDVEN" in the last 168 hours.  No results found for this or any previous visit (from the past 240 hour(s)).       Radiology Studies: CT ANGIO HEAD NECK W WO CM  Result Date: 11/19/2022 CLINICAL DATA:  Initial evaluation for stroke. EXAM: CT ANGIOGRAPHY HEAD AND NECK WITH AND WITHOUT CONTRAST TECHNIQUE: Multidetector CT imaging of the head and neck was performed using the standard protocol during bolus administration of intravenous contrast. Multiplanar CT image reconstructions and MIPs were obtained to evaluate the vascular anatomy. Carotid stenosis measurements (when applicable) are obtained utilizing NASCET criteria, using the distal internal carotid diameter as the denominator. RADIATION DOSE REDUCTION: This exam was performed according to the departmental dose-optimization program which includes automated exposure control, adjustment of the mA and/or kV according to patient size and/or use of iterative reconstruction technique. CONTRAST:  75mL OMNIPAQUE IOHEXOL 350 MG/ML SOLN COMPARISON:  Prior study from 11/18/2022. FINDINGS: CT HEAD FINDINGS Brain: Age-related cerebral atrophy with chronic small vessel ischemic disease.  Previously identified subcentimeter infarct involving the anterior left basal ganglia, grossly stable, better characterized on prior brain MRI. No other acute large vessel territory infarct. No acute intracranial hemorrhage. No mass lesion or midline shift. Mild ventricular prominence related global parenchymal volume loss of hydrocephalus. No extra-axial fluid collection. Vascular: No abnormal hyperdense vessel. Calcified atherosclerosis present at the skull base. Skull: Scalp soft tissues and calvarium demonstrate no acute finding. Sinuses/Orbits: Globes orbital soft tissues within normal limits. Paranasal sinuses are largely clear. No mastoid effusion. Other: Few small foci of gas about the cavernous sinus, likely related IV access. Review of the MIP images confirms the above findings CTA NECK FINDINGS Aortic arch: Visualized aortic arch normal caliber with standard branch pattern. Moderate atheromatous change about the arch itself. No significant stenosis about the origin the great vessels. Right carotid system: Right common and internal carotid arteries are patent without dissection. Mild atheromatous change about  the right carotid bulb without hemodynamically significant greater than 50% stenosis. Left carotid system: Left common and internal carotid arteries are patent without stenosis or dissection. Vertebral arteries: Both vertebral arteries arise from subclavian arteries. Right vertebral artery dominant. No proximal subclavian artery stenosis. Vertebral arteries are patent without stenosis or dissection. Skeleton: No discrete or worrisome osseous lesions. Mild-to-moderate spondylosis present at C5-6 and C6-7. Other neck: No other acute soft tissue abnormality within the neck. Upper chest: Scattered atelectatic changes and/or scarring noted within the visualized lungs. No other acute finding. Review of the MIP images confirms the above findings CTA HEAD FINDINGS Anterior circulation: Mild atheromatous  change within the carotid siphons without stenosis. A1 segments patent bilaterally. Normal anterior communicating complex. Anterior cerebral arteries patent without stenosis. No M1 stenosis or occlusion. No proximal MCA branch occlusion or high-grade stenosis. Distal MCA branches perfused and symmetric. Posterior circulation: Both V4 segments patent without stenosis. Left PICA patent. Right PICA origin not well seen. Short-segment fenestration at the vertebrobasilar junction noted. Basilar widely patent to its distal aspect. Superior cerebellar and posterior cerebral arteries patent bilaterally. Venous sinuses: Grossly patent allowing for timing the contrast bolus. Anatomic variants: None significant.  No aneurysm. Review of the MIP images confirms the above findings IMPRESSION: CT HEAD IMPRESSION: 1. Previously identified subcentimeter infarct involving the anterior left basal ganglia is grossly stable, better characterized on prior brain MRI. 2. No other new acute intracranial abnormality. 3. Underlying age-related cerebral atrophy with chronic small vessel ischemic disease. CTA HEAD AND NECK IMPRESSION: 1. Negative CTA for large vessel occlusion or other emergent finding. 2. Mild for age atheromatous disease without hemodynamically significant or correctable stenosis. Aortic Atherosclerosis (ICD10-I70.0). Electronically Signed   By: Rise Mu M.D.   On: 11/19/2022 03:42   MR Brain Wo Contrast  Result Date: 11/18/2022 CLINICAL DATA:  Provided history: Memory loss. EXAM: MRI HEAD WITHOUT CONTRAST TECHNIQUE: Multiplanar, multiecho pulse sequences of the brain and surrounding structures were obtained without intravenous contrast. COMPARISON:  No pertinent prior exams available for comparison. FINDINGS: Mild intermittent motion degradation. Brain: Mild-to-moderate for age cerebral atrophy without appreciable lobar predominance. 6 mm acute/subacute lacunar infarct within the left caudate head (series 2,  image 27) (series 5, image 19). Multifocal T2 FLAIR hyperintense signal abnormality within the cerebral white matter, nonspecific but compatible with mild chronic small vessel ischemic disease. No evidence of an intracranial mass. No chronic intracranial blood products. No extra-axial fluid collection. No midline shift. Vascular: Maintained flow voids within the proximal large arterial vessels. Skull and upper cervical spine: No focal suspicious marrow lesion. Sinuses/Orbits: No mass or acute finding within the imaged orbits. Prior bilateral ocular lens replacement. No significant paranasal sinus disease. Impression #2 will be called to the ordering clinician or representative by the Radiologist Assistant, and communication documented in the PACS or [employer]. IMPRESSION: 1. Mildly motion degraded exam. 2. 6 mm acute/subacute lacunar infarct within the left caudate head. 3. Mild chronic small vessel ischemic changes within the cerebral white matter. 4. Mild-to-moderate generalized cerebral atrophy. Electronically Signed   By: Jackey Loge D.O.   On: 11/18/2022 18:39        Scheduled Meds:  aspirin EC  81 mg Oral Daily   atorvastatin  40 mg Oral Daily   clopidogrel  75 mg Oral Daily   vitamin B-12  1,000 mcg Oral Daily   donepezil  10 mg Oral QHS   enoxaparin (LOVENOX) injection  40 mg Subcutaneous Q24H   memantine  10 mg  Oral BID   QUEtiapine  25 mg Oral BID   Continuous Infusions:  sodium chloride 100 mL/hr at 11/19/22 0600     LOS: 1 day    Time spent:33 min   Alwyn Ren, MD 11/19/2022, 11:05 AM

## 2022-11-19 NOTE — Evaluation (Signed)
Speech Language Pathology Evaluation Patient Details Name: Crystal Herrera MRN: [id number] DOB: [birth year] Today's Date: 11/19/2022 Time: 1425-1440 SLP Time Calculation (min) (ACUTE ONLY): 15 min  Problem List:  Patient Active Problem List   Diagnosis Date Noted   Acute CVA (cerebrovascular accident) (HCC) 11/18/2022   Estrogen deficiency 05/25/2022   Mild late onset Alzheimer's dementia without behavioral disturbance, psychotic disturbance, mood disturbance, or anxiety (HCC) 11/08/2021   Dementia, Alzheimer's, with behavior disturbance (HCC) 03/18/2021   Vitamin D deficiency 11/14/2019   Gastritis and gastroduodenitis 10/31/2014   Chronic mesenteric ischemia (HCC) 03/12/2014   Allergic rhinitis 01/29/2013   Hyperlipidemia 02/09/2010   Degenerative joint disease 02/09/2010   Disorder of bone and cartilage 02/09/2010   History of colonic polyps 02/09/2010   Past Medical History:  Past Medical History:  Diagnosis Date   Allergic rhinitis, cause unspecified 01/29/2013   Anemia 1948?   BACK PAIN, CHRONIC 02/09/2010   Cancer (HCC)    SKIN   Cataract    COLONIC POLYPS, HX OF 02/09/2010   COMMON MIGRAINE 02/09/2010   neuritis in R side of neck & head   DEGENERATIVE JOINT DISEASE 02/09/2010   hands , lumbar stenosis    DEPRESSION 02/09/2010   pt. takes Cymbalta for pain issues    GERD 02/09/2010   HYPERLIPIDEMIA 02/09/2010   HYPERTENSION 02/09/2010   Lumbar disc disease    Memory loss    Neuromuscular disorder (HCC)    neuritis in head & neck   OCCIPITAL NEURALGIA 06/09/2010   OSTEOPENIA 02/09/2010   Pneumonia    "as an infant" (05/15/2012)   Varicose vein of leg    BLE   Past Surgical History:  Past Surgical History:  Procedure Laterality Date   BREAST BIOPSY  1988   left; Neg   CHOLECYSTECTOMY  ? 2009   chymopapain injection  1986   Lumbar   COLONOSCOPY     DILATION AND CURETTAGE OF UTERUS  1960's   HERNIA REPAIR  05/15/2012   ventral incisional    LUMBAR FUSION   05/22/2013   L2  L3 decompression /fusion      Dr Yevette Edwards   LUMBAR LAMINECTOMY  1986   OVARIAN CYST REMOVAL  2006   Stimulator placed   07/2014   placed 2 years ago for pain   TRANSFORAMINAL LUMBAR INTERBODY FUSION (TLIF) WITH PEDICLE SCREW FIXATION 1 LEVEL Left 05/22/2013   Procedure: TRANSFORAMINAL LUMBAR INTERBODY FUSION (TLIF) WITH PEDICLE SCREW FIXATION 1 LEVEL;  Surgeon: Emilee Hero, MD;  Location: [facility] OR;  Service: Orthopedics;  Laterality: Left;  Left sided lumbar 2-3 Transforaminal lumbar interbody fusion with instrumentation, allograft   TUBAL LIGATION  1970's   VARICOSE VEIN SURGERY  ~ 2008   bilaterally   VENTRAL HERNIA REPAIR  05/15/2012   Procedure: LAPAROSCOPIC VENTRAL HERNIA;  Surgeon: Atilano Ina, MD,FACS;  Location: [facility] OR;  Service: General;  Laterality: N/A;  laparoscopic ventral incisional hernia repair with mesh   HPI:  Pt is an 82 y.o. female who presented 11/18/22 as referral from PCP due to worsening memory issues and MRI revealing a 6 mm acute/subacute lacunar infarct within the left caudate head. PMH: HLD, Alzheimer's disease, lumbar degenerative disc disease s/p spinal cord stimulator, remote 10-pack-year smoking history (quit 1950), anemia, cancer, cataract, HTN, osteopenia   Assessment / Plan / Recommendation Clinical Impression  Pt reports progressive memory issues, worsening since admission. Note medical hx significant for Alzheimer's disease and novel small acute CVA. Pt reports living with spouse  PLOF who provides 24 hour support. She no longer drives. She states spouse assists when needed with cooking tasks, medicine management and completes all financial management tasks. Administered [facility] Mental Status (SLUMS: 5/30) revealing moderate to severe cognitive deficits. Pt aware of recall issues but does have poor executive function abilities, slowed thought processing, decreased attention, and reduced problem solving. She will require  continued 24 hour supervision due to cognitive deficits. Receptive language, motor speech skills appeared intact. Pt exhibited some pauses with expressive communication, felt to be related to reduced thought organization but remained highly communicative. Will continue to follow during acute stay do to worsening cognitive decline in setting of CVA to maximize safety and for caregiver support.    SLP Assessment  SLP Recommendation/Assessment: Patient needs continued Speech Lanaguage Pathology Services SLP Visit Diagnosis: Cognitive communication deficit (R41.841)    Recommendations for follow up therapy are one component of a multi-disciplinary discharge planning process, led by the attending physician.  Recommendations may be updated based on patient status, additional functional criteria and insurance authorization.    Follow Up Recommendations  Follow physician's recommendations for discharge plan and follow up therapies    Assistance Recommended at Discharge  Frequent or constant Supervision/Assistance  Functional Status Assessment Patient has had a recent decline in their functional status and demonstrates the ability to make significant improvements in function in a reasonable and predictable amount of time.  Frequency and Duration min 1 x/week  2 weeks      SLP Evaluation Cognition  Overall Cognitive Status: Impaired/Different from baseline Arousal/Alertness: Awake/alert Orientation Level: Oriented X4;Oriented to place;Disoriented to time;Disoriented to situation Month: August Attention: Focused;Sustained Focused Attention: Impaired Sustained Attention: Impaired Memory: Impaired Memory Impairment: Decreased recall of new information;Decreased short term memory Awareness: Impaired Awareness Impairment: Intellectual impairment Problem Solving: Impaired Problem Solving Impairment: Verbal complex;Functional complex Executive Function: Organizing;Self Monitoring Organizing:  Impaired Self Monitoring: Impaired       Comprehension  Auditory Comprehension Overall Auditory Comprehension: Appears within functional limits for tasks assessed    Expression Expression Primary Mode of Expression: Verbal Verbal Expression Overall Verbal Expression: Appears within functional limits for tasks assessed Written Expression Dominant Hand: Right   Oral / Motor  Oral Motor/Sensory Function Overall Oral Motor/Sensory Function: Within functional limits Motor Speech Overall Motor Speech: Appears within functional limits for tasks assessed            Ardyth Gal MA, CCC-SLP Acute Rehabilitation Services   11/19/2022, 2:58 PM

## 2022-11-19 NOTE — Progress Notes (Signed)
STROKE TEAM PROGRESS NOTE   SUBJECTIVE (INTERVAL HISTORY) No family is at the bedside.  Overall her condition is unchanged. Pt sitting in chair, no active complains. Denies HA, pain. Admitted that she had memory issue but can not provide exact reason for being in hospital.    OBJECTIVE Temp:  [97.4 F (36.3 C)-98.7 F (37.1 C)] 97.8 F (36.6 C) (04/27 0747) Pulse Rate:  [54-64] 61 (04/27 0747) Cardiac Rhythm: Sinus bradycardia (04/27 0718) Resp:  [11-16] 16 (04/27 0747) BP: (133-189)/(64-95) 139/67 (04/27 0747) SpO2:  [96 %-99 %] 99 % (04/27 0747) Weight:  [63.5 kg] 63.5 kg (04/26 2016)  No results for input(s): "GLUCAP" in the last 168 hours. Recent Labs  Lab 11/18/22 2023 11/19/22 0335  NA 138  --   K 3.7  --   CL 102  --   CO2 27  --   GLUCOSE 101*  --   BUN 18  --   CREATININE 0.88 0.84  CALCIUM 10.2  --    Recent Labs  Lab 11/18/22 2023  AST 23  ALT 16  ALKPHOS 45  BILITOT 0.7  PROT 6.8  ALBUMIN 4.2   Recent Labs  Lab 11/18/22 2023 11/19/22 0335  WBC 6.4 5.1  NEUTROABS 3.0  --   HGB 15.2* 13.8  HCT 45.1 41.5  MCV 94.2 95.0  PLT 218 196   No results for input(s): "CKTOTAL", "CKMB", "CKMBINDEX", "TROPONINI" in the last 168 hours. Recent Labs    11/18/22 2023  LABPROT 13.3  INR 1.0   No results for input(s): "COLORURINE", "LABSPEC", "PHURINE", "GLUCOSEU", "HGBUR", "BILIRUBINUR", "KETONESUR", "PROTEINUR", "UROBILINOGEN", "NITRITE", "LEUKOCYTESUR" in the last 72 hours.  Invalid input(s): "APPERANCEUR"     Component Value Date/Time   CHOL 160 11/19/2022 0335   TRIG 46 11/19/2022 0335   HDL 62 11/19/2022 0335   CHOLHDL 2.6 11/19/2022 0335   VLDL 9 11/19/2022 0335   LDLCALC 89 11/19/2022 0335   Lab Results  Component Value Date   HGBA1C 5.3 11/19/2022   No results found for: "LABOPIA", "COCAINSCRNUR", "LABBENZ", "AMPHETMU", "THCU", "LABBARB"  No results for input(s): "ETH" in the last 168 hours.  I have personally reviewed the radiological  images below and agree with the radiology interpretations.  CT ANGIO HEAD NECK W WO CM  Result Date: 11/19/2022 CLINICAL DATA:  Initial evaluation for stroke. EXAM: CT ANGIOGRAPHY HEAD AND NECK WITH AND WITHOUT CONTRAST TECHNIQUE: Multidetector CT imaging of the head and neck was performed using the standard protocol during bolus administration of intravenous contrast. Multiplanar CT image reconstructions and MIPs were obtained to evaluate the vascular anatomy. Carotid stenosis measurements (when applicable) are obtained utilizing NASCET criteria, using the distal internal carotid diameter as the denominator. RADIATION DOSE REDUCTION: This exam was performed according to the departmental dose-optimization program which includes automated exposure control, adjustment of the mA and/or kV according to patient size and/or use of iterative reconstruction technique. CONTRAST:  75mL OMNIPAQUE IOHEXOL 350 MG/ML SOLN COMPARISON:  Prior study from 11/18/2022. FINDINGS: CT HEAD FINDINGS Brain: Age-related cerebral atrophy with chronic small vessel ischemic disease. Previously identified subcentimeter infarct involving the anterior left basal ganglia, grossly stable, better characterized on prior brain MRI. No other acute large vessel territory infarct. No acute intracranial hemorrhage. No mass lesion or midline shift. Mild ventricular prominence related global parenchymal volume loss of hydrocephalus. No extra-axial fluid collection. Vascular: No abnormal hyperdense vessel. Calcified atherosclerosis present at the skull base. Skull: Scalp soft tissues and calvarium demonstrate no acute finding. Sinuses/Orbits: Globes  orbital soft tissues within normal limits. Paranasal sinuses are largely clear. No mastoid effusion. Other: Few small foci of gas about the cavernous sinus, likely related IV access. Review of the MIP images confirms the above findings CTA NECK FINDINGS Aortic arch: Visualized aortic arch normal caliber with  standard branch pattern. Moderate atheromatous change about the arch itself. No significant stenosis about the origin the great vessels. Right carotid system: Right common and internal carotid arteries are patent without dissection. Mild atheromatous change about the right carotid bulb without hemodynamically significant greater than 50% stenosis. Left carotid system: Left common and internal carotid arteries are patent without stenosis or dissection. Vertebral arteries: Both vertebral arteries arise from subclavian arteries. Right vertebral artery dominant. No proximal subclavian artery stenosis. Vertebral arteries are patent without stenosis or dissection. Skeleton: No discrete or worrisome osseous lesions. Mild-to-moderate spondylosis present at C5-6 and C6-7. Other neck: No other acute soft tissue abnormality within the neck. Upper chest: Scattered atelectatic changes and/or scarring noted within the visualized lungs. No other acute finding. Review of the MIP images confirms the above findings CTA HEAD FINDINGS Anterior circulation: Mild atheromatous change within the carotid siphons without stenosis. A1 segments patent bilaterally. Normal anterior communicating complex. Anterior cerebral arteries patent without stenosis. No M1 stenosis or occlusion. No proximal MCA branch occlusion or high-grade stenosis. Distal MCA branches perfused and symmetric. Posterior circulation: Both V4 segments patent without stenosis. Left PICA patent. Right PICA origin not well seen. Short-segment fenestration at the vertebrobasilar junction noted. Basilar widely patent to its distal aspect. Superior cerebellar and posterior cerebral arteries patent bilaterally. Venous sinuses: Grossly patent allowing for timing the contrast bolus. Anatomic variants: None significant.  No aneurysm. Review of the MIP images confirms the above findings IMPRESSION: CT HEAD IMPRESSION: 1. Previously identified subcentimeter infarct involving the  anterior left basal ganglia is grossly stable, better characterized on prior brain MRI. 2. No other new acute intracranial abnormality. 3. Underlying age-related cerebral atrophy with chronic small vessel ischemic disease. CTA HEAD AND NECK IMPRESSION: 1. Negative CTA for large vessel occlusion or other emergent finding. 2. Mild for age atheromatous disease without hemodynamically significant or correctable stenosis. Aortic Atherosclerosis (ICD10-I70.0). Electronically Signed   By: Rise Mu M.D.   On: 11/19/2022 03:42   MR Brain Wo Contrast  Result Date: 11/18/2022 CLINICAL DATA:  Provided history: Memory loss. EXAM: MRI HEAD WITHOUT CONTRAST TECHNIQUE: Multiplanar, multiecho pulse sequences of the brain and surrounding structures were obtained without intravenous contrast. COMPARISON:  No pertinent prior exams available for comparison. FINDINGS: Mild intermittent motion degradation. Brain: Mild-to-moderate for age cerebral atrophy without appreciable lobar predominance. 6 mm acute/subacute lacunar infarct within the left caudate head (series 2, image 27) (series 5, image 19). Multifocal T2 FLAIR hyperintense signal abnormality within the cerebral white matter, nonspecific but compatible with mild chronic small vessel ischemic disease. No evidence of an intracranial mass. No chronic intracranial blood products. No extra-axial fluid collection. No midline shift. Vascular: Maintained flow voids within the proximal large arterial vessels. Skull and upper cervical spine: No focal suspicious marrow lesion. Sinuses/Orbits: No mass or acute finding within the imaged orbits. Prior bilateral ocular lens replacement. No significant paranasal sinus disease. Impression #2 will be called to the ordering clinician or representative by the Radiologist Assistant, and communication documented in the PACS or [employer]. IMPRESSION: 1. Mildly motion degraded exam. 2. 6 mm acute/subacute lacunar infarct within the  left caudate head. 3. Mild chronic small vessel ischemic changes within the cerebral white matter.  4. Mild-to-moderate generalized cerebral atrophy. Electronically Signed   By: Jackey Loge D.O.   On: 11/18/2022 18:39     PHYSICAL EXAM  Temp:  [97.4 F (36.3 C)-98.7 F (37.1 C)] 97.8 F (36.6 C) (04/27 0747) Pulse Rate:  [54-64] 61 (04/27 0747) Resp:  [11-16] 16 (04/27 0747) BP: (133-189)/(64-95) 139/67 (04/27 0747) SpO2:  [96 %-99 %] 99 % (04/27 0747) Weight:  [63.5 kg] 63.5 kg (04/26 2016)  General - Well nourished, well developed, in no apparent distress.  Ophthalmologic - fundi not visualized due to noncooperation.  Cardiovascular - Regular rhythm and rate.  Mental Status -  Level of arousal and orientation to time, place, and person were intact. Language including expression, repetition, comprehension was assessed and found intact. Naming 3/4.  Recent and remote memory test showed 3/3 registration and 2/3 delayed recall  Cranial Nerves II - XII - II - Visual field intact OU. III, IV, VI - Extraocular movements intact. V - Facial sensation intact bilaterally. VII - Facial movement intact bilaterally. VIII - Hearing & vestibular intact bilaterally. X - Palate elevates symmetrically. XI - Chin turning & shoulder shrug intact bilaterally. XII - Tongue protrusion intact.  Motor Strength - The patient's strength was normal in all extremities and pronator drift was absent.  Bulk was normal and fasciculations were absent.   Motor Tone - Muscle tone was assessed at the neck and appendages and was normal.  Reflexes - The patient's reflexes were symmetrical in all extremities and she had no pathological reflexes.  Sensory - Light touch, temperature/pinprick were assessed and were symmetrical.    Coordination - The patient had normal movements in the hands with no ataxia or dysmetria.  Tremor was absent.  Gait and Station - deferred.   ASSESSMENT/PLAN Crystal Herrera  is a 82 y.o. female with history of demential, HLD, LBP s/p spinal cord stimulator admitted for progressive cognitive decline. No tPA given due to outside window.    Stroke:  left caudate head small infarct embolic secondary to small vessel disease source CT no acute abnormalities CTA head and neck unremarkable MRI  small left caudate head infarct 2D Echo  pending LDL 89 HgbA1c 5.3 Lovenox for VTE prophylaxis No antithrombotic prior to admission, now on aspirin 81 mg daily and clopidogrel 75 mg daily DAPT for 3 weeks and then ASA alone. Patient counseled to be compliant with her antithrombotic medications Ongoing aggressive stroke risk factor management Therapy recommendations:  pending Disposition:  pending  Hypertension BP high on admission but now stable Long term BP goal normotensive  Hyperlipidemia Home meds:  zocor 20  LDL 89, goal < 70 Now on lipitor 40 Continue statin at discharge  Dementia  Follows with Dr. Terrace Arabia at [facility] On aricept and namenda  On seroquel 25 bid Continue current meds and follow up with Dr. Terrace Arabia  Other Stroke Risk Factors Advanced age  Other Active Problems LBP s/p spinal cord stimulator  Hospital day # 1  Neurology will sign off. Please call with questions. Pt will follow up with Dr. Terrace Arabia at [facility] in about 4 weeks. Thanks for the consult.   Marvel Plan, MD PhD Stroke Neurology 11/19/2022 11:26 AM    To contact Stroke Continuity provider, please refer to [email or web address]. After hours, contact General Neurology

## 2022-11-20 ENCOUNTER — Inpatient Hospital Stay (HOSPITAL_COMMUNITY): Payer: Medicare Other

## 2022-11-20 DIAGNOSIS — G309 Alzheimer's disease, unspecified: Secondary | ICD-10-CM

## 2022-11-20 DIAGNOSIS — E785 Hyperlipidemia, unspecified: Secondary | ICD-10-CM

## 2022-11-20 DIAGNOSIS — I6389 Other cerebral infarction: Secondary | ICD-10-CM

## 2022-11-20 DIAGNOSIS — F02818 Dementia in other diseases classified elsewhere, unspecified severity, with other behavioral disturbance: Secondary | ICD-10-CM

## 2022-11-20 DIAGNOSIS — I639 Cerebral infarction, unspecified: Secondary | ICD-10-CM | POA: Diagnosis not present

## 2022-11-20 LAB — ECHOCARDIOGRAM COMPLETE
AR max vel: 1.18 cm2
AV Area VTI: 1.22 cm2
AV Area mean vel: 1.16 cm2
AV Mean grad: 3 mmHg
AV Peak grad: 5.5 mmHg
Ao pk vel: 1.17 m/s
Area-P 1/2: 2.48 cm2
Calc EF: 60.7 %
Height: 65 in
MV VTI: 1.24 cm2
S' Lateral: 2.8 cm
Single Plane A2C EF: 61.5 %
Single Plane A4C EF: 60.8 %
Weight: 2239.87 oz

## 2022-11-20 MED ORDER — ASPIRIN 81 MG PO TBEC: 81.0000 mg | DELAYED_RELEASE_TABLET | Freq: Every day | ORAL | 12 refills | Status: AC

## 2022-11-20 MED ORDER — ATORVASTATIN CALCIUM 40 MG PO TABS: 40.0000 mg | ORAL_TABLET | Freq: Every day | ORAL | 2 refills | Status: DC

## 2022-11-20 MED ORDER — CLOPIDOGREL BISULFATE 75 MG PO TABS: 75.0000 mg | ORAL_TABLET | Freq: Every day | ORAL | 0 refills | Status: DC

## 2022-11-20 MED ORDER — SENNOSIDES-DOCUSATE SODIUM 8.6-50 MG PO TABS: 1.0000 | ORAL_TABLET | Freq: Every evening | ORAL | Status: DC | PRN

## 2022-11-20 NOTE — TOC Transition Note (Signed)
Transition of Care [facility]) - CM/SW Discharge Note   Patient Details  Name: Crystal Herrera MRN: [id number] Date of Birth: [birth year]  Transition of Care [facility]) CM/SW Contact:  Lawerance Sabal, RN Phone Number: 11/20/2022, 1:27 PM   Clinical Narrative:     Spoke to patient's spouse over the phone to discuss DC needs.  Explained that insurance doesn't cover stand alone SLP HH services, agreeable to OP SLP services. Referral made to [facility], as they will also have follow up appointment there.  Referral [id number]. Added to AVS   Final next level of care: Home/Self Care Barriers to Discharge: No Barriers Identified   Patient Goals and CMS Choice CMS Medicare.gov Compare Post Acute Care list provided to:: Other (Comment Required) Choice offered to / list presented to : Spouse  Discharge Placement                         Discharge Plan and Services Additional resources added to the After Visit Summary for                  DME Arranged: N/A                    Social Determinants of Health (SDOH) Interventions SDOH Screenings   Food Insecurity: No Food Insecurity (11/18/2022)  Housing: Low Risk  (11/18/2022)  Transportation Needs: No Transportation Needs (11/18/2022)  Utilities: Not At Risk (11/18/2022)  Depression (PHQ2-9): Low Risk  (10/11/2022)  Financial Resource Strain: Low Risk  (10/30/2017)  Physical Activity: Inactive (10/30/2017)  Social Connections: Socially Integrated (10/30/2017)  Stress: No Stress Concern Present (10/30/2017)  Tobacco Use: Medium Risk (11/18/2022)     Readmission Risk Interventions     No data to display

## 2022-11-20 NOTE — Plan of Care (Signed)

## 2022-11-20 NOTE — Progress Notes (Signed)
Discharge instructions given to patient, patient ready to go.

## 2022-11-20 NOTE — Discharge Summary (Signed)
Physician Discharge Summary   Patient: Crystal Herrera MRN: [id number] DOB: Oct 23, 1940  Admit date:     11/18/2022  Discharge date: 11/20/2022  Discharge Physician: Kathlen Mody   PCP: Corwin Levins, MD   Recommendations at discharge:  Please follow up with PCp in one week.  Please follow up with Neurology as recommended.  Please check cbc and bmp in one week.   Discharge Diagnoses: Principal Problem:   Acute CVA (cerebrovascular accident) [facility]) Active Problems:   Hyperlipidemia   Allergic rhinitis   Dementia, Alzheimer's, with behavior disturbance (HCC)  Resolved Problems:   * No resolved hospital problems. [facility] Course: Patient came to the ER for abnormal MRI brain findings.  Patient was being worked up as an outpatient for increasing memory loss and MRI of the brain was done as a part of that.  It came back positive for acute to subacute lacunar stroke her PCP advised her to come to the ER. She denies dysphagia, dysarthria, headaches or changes with her vision.  She is able to walk to the bathroom by herself. Her past medical history significant for prior tobacco use, degenerative joint disease hyperlipidemia and dementia.  Lives with her husband at home. MRI brain showed  6 mm acute/subacute lacunar infarct within the left caudate head. Mild chronic small vessel ischemic changes within the cerebral white matter. Mild-to-moderate generalized cerebral atrophy.    Assessment and Plan:  #1 lacunar stroke patient presented with memory issues MRI brain showed acute/ sub acute lacunar infarct within the left caudate head.  CT angio no LVO.  Neurology recommended aspirin and plavix for 21 days followed by aspirin daily.  LDL is 89, S1c os 5.3% Continue with lipitor to 40 mg daily.  Echo unremarkable.  Therapy eval done and no follow up .  Home health [facility]p follow up.    #2 dementia on Namenda and Aricept at home. Continue with seroquel 25 mg bID.    #3 hyperlipidemia  on statin LDL 89  Hypertension:  Well controlled.     Consultants: neurology.  Procedures performed: MRI brain. CT angio of the head and neck.   Disposition: Home Diet recommendation:  Discharge Diet Orders (From admission, onward)     Start     Ordered   11/20/22 0000  Diet - low sodium heart healthy        11/20/22 1223           Cardiac diet DISCHARGE MEDICATION: Allergies as of 11/20/2022       Reactions   Cefuroxime    Might cause hives   Cephalosporins    Might cause hives   Latex Nausea Only   Celexa [citalopram Hydrobromide] Nausea Only   Dexlansoprazole Anxiety   Sleeplessness heart racing headaches chills   Lexapro [escitalopram Oxalate] Nausea Only   Penicillins Hives   Sulfonamide Derivatives Hives   Vancomycin Rash        Medication List     STOP taking these medications    simvastatin 20 MG tablet Commonly known as: ZOCOR       TAKE these medications    aspirin EC 81 MG tablet Take 1 tablet (81 mg total) by mouth daily. Swallow whole. Start taking on: November 21, 2022   atorvastatin 40 MG tablet Commonly known as: LIPITOR Take 1 tablet (40 mg total) by mouth daily. Start taking on: November 21, 2022   Biotin 5000 MCG Tabs Take 5,000 mcg by mouth daily.   calcium carbonate 1500 (  600 Ca) MG Tabs tablet Commonly known as: OSCAL Take 1,200 mg by mouth daily with breakfast.   clopidogrel 75 MG tablet Commonly known as: PLAVIX Take 1 tablet (75 mg total) by mouth daily. Start taking on: November 21, 2022   donepezil 10 MG tablet Commonly known as: ARICEPT Take 1 tablet (10 mg total) by mouth at bedtime.   memantine 10 MG tablet Commonly known as: Namenda Take 1 tablet (10 mg total) by mouth 2 (two) times daily.   OVER THE COUNTER MEDICATION Take 2 tablets by mouth daily. Youthful Brain   OVER THE COUNTER MEDICATION Take 1 packet by mouth daily. Noobru: L theanine: mix 1 packet of powder with water and drink daily   QUEtiapine 25  MG tablet Commonly known as: SEROquel Take 1 tablet (25 mg total) by mouth 2 (two) times daily.   senna-docusate 8.6-50 MG tablet Commonly known as: Senokot-S Take 1 tablet by mouth at bedtime as needed for moderate constipation.   VITAMIN D3 PO Take 2,000 Units by mouth daily.        Follow-up Information     Levert Feinstein, MD. Schedule an appointment as soon as possible for a visit in 1 month(s).   Specialty: Neurology Contact information: [address] [place] [address] [phone]         [facility] Follow up.   Specialty: Rehabilitation Why: for speech therapy Contact information: [address] [id number] [place] [address] [phone]               Discharge Exam: Ceasar Mons Weights   11/18/22 2016  Weight: 63.5 kg   General exam: Appears calm and comfortable  Respiratory system: Clear to auscultation. Respiratory effort normal. Cardiovascular system: S1 & S2 heard, RRR. No JVD,  Gastrointestinal system: Abdomen is nondistended, soft and nontender.  Central nervous system: Alert and oriented. No focal neurological deficits. Extremities: Symmetric 5 x 5 power. Skin: No rashes, Psychiatry:  Mood & affect appropriate.    Condition at discharge: fair  The results of significant diagnostics from this hospitalization (including imaging, microbiology, ancillary and laboratory) are listed below for reference.   Imaging Studies: ECHOCARDIOGRAM COMPLETE  Result Date: 11/20/2022    ECHOCARDIOGRAM REPORT   Patient Name:   Crystal Herrera Date of Exam: 11/20/2022 Medical Rec #:  [id number]          Height:       65.0 in Accession #:    [id number]         Weight:       140.0 lb Date of Birth:  [birth year]          BSA:          1.700 m Patient Age:    82 years           BP:           153/68 mmHg Patient Gender: F                  HR:           52 bpm. Exam Location:  Inpatient Procedure: 2D Echo,  Cardiac Doppler and Color Doppler Indications:    Sroke  History:        Patient has prior history of Echocardiogram examinations, most                 recent 01/22/2015. Stroke, Signs/Symptoms:Alzheimer's; Risk  Factors:Dyslipidemia.  Sonographer:    Wallie Char Referring Phys: Rometta Emery IMPRESSIONS  1. Left ventricular ejection fraction, by estimation, is 55 to 60%. The left ventricle has normal function. The left ventricle has no regional wall motion abnormalities. Left ventricular diastolic parameters are consistent with Grade I diastolic dysfunction (impaired relaxation).  2. Right ventricular systolic function is normal. The right ventricular size is normal.  3. The mitral valve is abnormal. Mild mitral valve regurgitation. No evidence of mitral stenosis.  4. The aortic valve is tricuspid. There is mild calcification of the aortic valve. There is mild thickening of the aortic valve. Aortic valve regurgitation is not visualized. Aortic valve sclerosis is present, with no evidence of aortic valve stenosis.  5. The inferior vena cava is normal in size with greater than 50% respiratory variability, suggesting right atrial pressure of 3 mmHg. FINDINGS  Left Ventricle: Left ventricular ejection fraction, by estimation, is 55 to 60%. The left ventricle has normal function. The left ventricle has no regional wall motion abnormalities. The left ventricular internal cavity size was normal in size. There is  no left ventricular hypertrophy. Left ventricular diastolic parameters are consistent with Grade I diastolic dysfunction (impaired relaxation). Right Ventricle: The right ventricular size is normal. No increase in right ventricular wall thickness. Right ventricular systolic function is normal. Left Atrium: Left atrial size was normal in size. Right Atrium: Right atrial size was normal in size. Pericardium: There is no evidence of pericardial effusion. Mitral Valve: The mitral valve is  abnormal. There is mild thickening of the mitral valve leaflet(s). There is mild calcification of the mitral valve leaflet(s). Mild mitral annular calcification. Mild mitral valve regurgitation. No evidence of mitral valve stenosis. MV peak gradient, 3.0 mmHg. The mean mitral valve gradient is 1.0 mmHg. Tricuspid Valve: The tricuspid valve is normal in structure. Tricuspid valve regurgitation is mild . No evidence of tricuspid stenosis. Aortic Valve: The aortic valve is tricuspid. There is mild calcification of the aortic valve. There is mild thickening of the aortic valve. Aortic valve regurgitation is not visualized. Aortic valve sclerosis is present, with no evidence of aortic valve stenosis. Aortic valve mean gradient measures 3.0 mmHg. Aortic valve peak gradient measures 5.5 mmHg. Aortic valve area, by VTI measures 1.22 cm. Pulmonic Valve: The pulmonic valve was normal in structure. Pulmonic valve regurgitation is not visualized. No evidence of pulmonic stenosis. Aorta: The aortic root is normal in size and structure. Venous: The inferior vena cava is normal in size with greater than 50% respiratory variability, suggesting right atrial pressure of 3 mmHg. IAS/Shunts: The interatrial septum appears to be lipomatous. No atrial level shunt detected by color flow Doppler.  LEFT VENTRICLE PLAX 2D LVIDd:         4.10 cm     Diastology LVIDs:         2.80 cm     LV e' medial:    5.80 cm/s LV PW:         0.80 cm     LV E/e' medial:  11.6 LV IVS:        0.80 cm     LV e' lateral:   7.50 cm/s LVOT diam:     1.50 cm     LV E/e' lateral: 9.0 LV SV:         34 LV SV Index:   20 LVOT Area:     1.77 cm  LV Volumes (MOD) LV vol d, MOD A2C: 56.4 ml LV vol  d, MOD A4C: 59.2 ml LV vol s, MOD A2C: 21.7 ml LV vol s, MOD A4C: 23.2 ml LV SV MOD A2C:     34.7 ml LV SV MOD A4C:     59.2 ml LV SV MOD BP:      35.6 ml RIGHT VENTRICLE             IVC RV Basal diam:  3.50 cm     IVC diam: 1.10 cm RV S prime:     11.40 cm/s TAPSE (M-mode):  2.4 cm LEFT ATRIUM             Index        RIGHT ATRIUM           Index LA diam:        2.30 cm 1.35 cm/m   RA Area:     12.70 cm LA Vol (A2C):   29.2 ml 17.18 ml/m  RA Volume:   30.10 ml  17.71 ml/m LA Vol (A4C):   34.7 ml 20.41 ml/m LA Biplane Vol: 35.0 ml 20.59 ml/m  AORTIC VALVE AV Area (Vmax):    1.18 cm AV Area (Vmean):   1.16 cm AV Area (VTI):     1.22 cm AV Vmax:           117.00 cm/s AV Vmean:          84.450 cm/s AV VTI:            0.276 m AV Peak Grad:      5.5 mmHg AV Mean Grad:      3.0 mmHg LVOT Vmax:         77.85 cm/s LVOT Vmean:        55.250 cm/s LVOT VTI:          0.190 m LVOT/AV VTI ratio: 0.69  AORTA Ao Root diam: 2.70 cm Ao Asc diam:  3.10 cm MITRAL VALVE               TRICUSPID VALVE MV Area (PHT): 2.48 cm    TR Peak grad:   21.5 mmHg MV Area VTI:   1.24 cm    TR Vmax:        232.00 cm/s MV Peak grad:  3.0 mmHg MV Mean grad:  1.0 mmHg    SHUNTS MV Vmax:       0.86 m/s    Systemic VTI:  0.19 m MV Vmean:      44.3 cm/s   Systemic Diam: 1.50 cm MV Decel Time: 306 msec MV E velocity: 67.20 cm/s MV A velocity: 80.60 cm/s MV E/A ratio:  0.83 Charlton Haws MD Electronically signed by Charlton Haws MD Signature Date/Time: 11/20/2022/10:44:24 AM    Final    CT ANGIO HEAD NECK W WO CM  Result Date: 11/19/2022 CLINICAL DATA:  Initial evaluation for stroke. EXAM: CT ANGIOGRAPHY HEAD AND NECK WITH AND WITHOUT CONTRAST TECHNIQUE: Multidetector CT imaging of the head and neck was performed using the standard protocol during bolus administration of intravenous contrast. Multiplanar CT image reconstructions and MIPs were obtained to evaluate the vascular anatomy. Carotid stenosis measurements (when applicable) are obtained utilizing NASCET criteria, using the distal internal carotid diameter as the denominator. RADIATION DOSE REDUCTION: This exam was performed according to the departmental dose-optimization program which includes automated exposure control, adjustment of the mA and/or kV according  to patient size and/or use of iterative reconstruction technique. CONTRAST:  75mL OMNIPAQUE IOHEXOL 350 MG/ML SOLN COMPARISON:  Prior study from  11/18/2022. FINDINGS: CT HEAD FINDINGS Brain: Age-related cerebral atrophy with chronic small vessel ischemic disease. Previously identified subcentimeter infarct involving the anterior left basal ganglia, grossly stable, better characterized on prior brain MRI. No other acute large vessel territory infarct. No acute intracranial hemorrhage. No mass lesion or midline shift. Mild ventricular prominence related global parenchymal volume loss of hydrocephalus. No extra-axial fluid collection. Vascular: No abnormal hyperdense vessel. Calcified atherosclerosis present at the skull base. Skull: Scalp soft tissues and calvarium demonstrate no acute finding. Sinuses/Orbits: Globes orbital soft tissues within normal limits. Paranasal sinuses are largely clear. No mastoid effusion. Other: Few small foci of gas about the cavernous sinus, likely related IV access. Review of the MIP images confirms the above findings CTA NECK FINDINGS Aortic arch: Visualized aortic arch normal caliber with standard branch pattern. Moderate atheromatous change about the arch itself. No significant stenosis about the origin the great vessels. Right carotid system: Right common and internal carotid arteries are patent without dissection. Mild atheromatous change about the right carotid bulb without hemodynamically significant greater than 50% stenosis. Left carotid system: Left common and internal carotid arteries are patent without stenosis or dissection. Vertebral arteries: Both vertebral arteries arise from subclavian arteries. Right vertebral artery dominant. No proximal subclavian artery stenosis. Vertebral arteries are patent without stenosis or dissection. Skeleton: No discrete or worrisome osseous lesions. Mild-to-moderate spondylosis present at C5-6 and C6-7. Other neck: No other acute soft tissue  abnormality within the neck. Upper chest: Scattered atelectatic changes and/or scarring noted within the visualized lungs. No other acute finding. Review of the MIP images confirms the above findings CTA HEAD FINDINGS Anterior circulation: Mild atheromatous change within the carotid siphons without stenosis. A1 segments patent bilaterally. Normal anterior communicating complex. Anterior cerebral arteries patent without stenosis. No M1 stenosis or occlusion. No proximal MCA branch occlusion or high-grade stenosis. Distal MCA branches perfused and symmetric. Posterior circulation: Both V4 segments patent without stenosis. Left PICA patent. Right PICA origin not well seen. Short-segment fenestration at the vertebrobasilar junction noted. Basilar widely patent to its distal aspect. Superior cerebellar and posterior cerebral arteries patent bilaterally. Venous sinuses: Grossly patent allowing for timing the contrast bolus. Anatomic variants: None significant.  No aneurysm. Review of the MIP images confirms the above findings IMPRESSION: CT HEAD IMPRESSION: 1. Previously identified subcentimeter infarct involving the anterior left basal ganglia is grossly stable, better characterized on prior brain MRI. 2. No other new acute intracranial abnormality. 3. Underlying age-related cerebral atrophy with chronic small vessel ischemic disease. CTA HEAD AND NECK IMPRESSION: 1. Negative CTA for large vessel occlusion or other emergent finding. 2. Mild for age atheromatous disease without hemodynamically significant or correctable stenosis. Aortic Atherosclerosis (ICD10-I70.0). Electronically Signed   By: Rise Mu M.D.   On: 11/19/2022 03:42   MR Brain Wo Contrast  Result Date: 11/18/2022 CLINICAL DATA:  Provided history: Memory loss. EXAM: MRI HEAD WITHOUT CONTRAST TECHNIQUE: Multiplanar, multiecho pulse sequences of the brain and surrounding structures were obtained without intravenous contrast. COMPARISON:  No  pertinent prior exams available for comparison. FINDINGS: Mild intermittent motion degradation. Brain: Mild-to-moderate for age cerebral atrophy without appreciable lobar predominance. 6 mm acute/subacute lacunar infarct within the left caudate head (series 2, image 27) (series 5, image 19). Multifocal T2 FLAIR hyperintense signal abnormality within the cerebral white matter, nonspecific but compatible with mild chronic small vessel ischemic disease. No evidence of an intracranial mass. No chronic intracranial blood products. No extra-axial fluid collection. No midline shift. Vascular: Maintained flow voids within the  proximal large arterial vessels. Skull and upper cervical spine: No focal suspicious marrow lesion. Sinuses/Orbits: No mass or acute finding within the imaged orbits. Prior bilateral ocular lens replacement. No significant paranasal sinus disease. Impression #2 will be called to the ordering clinician or representative by the Radiologist Assistant, and communication documented in the PACS or [employer]. IMPRESSION: 1. Mildly motion degraded exam. 2. 6 mm acute/subacute lacunar infarct within the left caudate head. 3. Mild chronic small vessel ischemic changes within the cerebral white matter. 4. Mild-to-moderate generalized cerebral atrophy. Electronically Signed   By: Jackey Loge D.O.   On: 11/18/2022 18:39    Microbiology: Results for orders placed or performed during the hospital encounter of 05/22/13  Surgical pcr screen     Status: None   Collection Time: 05/22/13  9:33 AM   Specimen: Nasal Mucosa; Nasal Swab  Result Value Ref Range Status   MRSA, PCR NEGATIVE NEGATIVE Final   Staphylococcus aureus NEGATIVE NEGATIVE Final    Comment:        The Xpert SA Assay (FDA approved for NASAL specimens in patients over 53 years of age), is one component of a comprehensive surveillance program.  Test performance has been validated by [employer] for patients greater than or equal  to 53 year old. It is not intended to diagnose infection nor to guide or monitor treatment.    Labs: CBC: Recent Labs  Lab 11/18/22 2023 11/19/22 0335  WBC 6.4 5.1  NEUTROABS 3.0  --   HGB 15.2* 13.8  HCT 45.1 41.5  MCV 94.2 95.0  PLT 218 196   Basic Metabolic Panel: Recent Labs  Lab 11/18/22 2023 11/19/22 0335  NA 138  --   K 3.7  --   CL 102  --   CO2 27  --   GLUCOSE 101*  --   BUN 18  --   CREATININE 0.88 0.84  CALCIUM 10.2  --    Liver Function Tests: Recent Labs  Lab 11/18/22 2023  AST 23  ALT 16  ALKPHOS 45  BILITOT 0.7  PROT 6.8  ALBUMIN 4.2   CBG: No results for input(s): "GLUCAP" in the last 168 hours.  Discharge time spent: 42 minutes.   Signed: Kathlen Mody, MD [facility] 11/20/2022

## 2022-11-21 ENCOUNTER — Other Ambulatory Visit: Payer: Self-pay | Admitting: Internal Medicine

## 2022-11-21 ENCOUNTER — Telehealth: Payer: Self-pay | Admitting: *Deleted

## 2022-11-21 ENCOUNTER — Encounter: Payer: Self-pay | Admitting: *Deleted

## 2022-11-21 MED ORDER — DONEPEZIL HCL 10 MG PO TABS: 10.0000 mg | ORAL_TABLET | Freq: Every day | ORAL | 3 refills | Status: DC

## 2022-11-21 NOTE — Transitions of Care (Post Inpatient/ED Visit) (Signed)
   11/21/2022  Name: Crystal Herrera MRN: [id number] DOB: [birth year]  Today's TOC FU Call Status: Today's TOC FU Call Status:: Unsuccessul Call (1st Attempt) Unsuccessful Call (1st Attempt) Date: 11/21/22  Attempted to reach the patient regarding the most recent Inpatient visit; ; left HIPAA compliant voice message requesting call back  Follow Up Plan: Additional outreach attempts will be made to reach the patient to complete the Transitions of Care (Post Inpatient visit) call.   Caryl Pina, RN, BSN, CCRN Alumnus RN CM Care Coordination/ Transition of Care- [facility] Care Management [phone]: direct office

## 2022-11-22 ENCOUNTER — Encounter: Payer: Self-pay | Admitting: *Deleted

## 2022-11-22 ENCOUNTER — Telehealth: Payer: Self-pay | Admitting: *Deleted

## 2022-11-22 NOTE — Transitions of Care (Post Inpatient/ED Visit) (Signed)
11/22/2022  Name: REONNA FINLAYSON MRN: [id number] DOB: 1941-02-25  Today's TOC FU Call Status: Today's TOC FU Call Status:: Successful TOC FU Call Competed TOC FU Call Complete Date: 11/22/22  Transition Care Management Follow-up Telephone Call Date of Discharge: 11/20/22 Discharge Facility: [facility]) Type of Discharge: Inpatient Admission Primary Inpatient Discharge Diagnosis:: CVA How have you been since you were released from the hospital?: Better ("I feel like I am back to normal; not having any issues with walking or steadiness; able to do just about everything on my own.  My husband helps me with my medications takes me to all of my appointments.  I do have a few questions about my medicines") Any questions or concerns?: Yes Patient Questions/Concerns:: 1) no refills on newly prescribed Plavix and 2) wants to know if it is still okay to take tylenol if she needs it in the future- reports does not currently need, but wants to know pro-actively Patient Questions/Concerns Addressed: Notified Provider of Patient Questions/Concerns, Other: (messaged PCP regarding medication questions; advised patient/ spouse to call neurology provider after [facility] call to schedule hospital follow up visit- explained that neurologist may be most appropriate provider to refill plavix)  Items Reviewed: Did you receive and understand the discharge instructions provided?: Yes (thoroughly reviewed with patient and spouse who both verbalize good understanding of same) Medications obtained and verified?: Yes (Medications Reviewed) (Full medication reconciliation/ review completed; no discrepancies identified; confirmed patient obtained/ is taking all newly Rx'd medications as instructed; self-manages medications and questions/ concerns around medications were addressed today) Any new allergies since your discharge?: No Dietary orders reviewed?: Yes Type of Diet Ordered:: low salt, heart healthy Do you have  support at home?: Yes People in Home: spouse Name of Support/Comfort Primary Source: Reports independent in self-care activities; supportive spouse assists as/ if needed/ indicated  Home Care and Equipment/Supplies: Were Home Health Services Ordered?: No Any new equipment or medical supplies ordered?: No  Functional Questionnaire: Do you need assistance with bathing/showering or dressing?: No Do you need assistance with meal preparation?: No Do you need assistance with eating?: No Do you have difficulty maintaining continence: No Do you need assistance with getting out of bed/getting out of a chair/moving?: No Do you have difficulty managing or taking your medications?: Yes (husband assists with medications)  Follow up appointments reviewed: PCP Follow-up appointment confirmed?: Yes Date of PCP follow-up appointment?: 11/28/22 Follow-up Provider: PCP, Dr. Jonny Ruiz Specialist [facility] Follow-up appointment confirmed?: No Reason Specialist Follow-Up Not Confirmed: Patient has Specialist Provider Number and will Call for Appointment (confirmed patient/ spouse have contact information for neurology provider- advised them to call to schedule hospital follow up visit promptly- today after TOC call completed) Do you need transportation to your follow-up appointment?: No Do you understand care options if your condition(s) worsen?: Yes-patient verbalized understanding  SDOH Interventions Today    Flowsheet Row Most Recent Value  SDOH Interventions   Food Insecurity Interventions Intervention Not Indicated  Transportation Interventions Intervention Not Indicated  [husband provides transportation]      TOC Interventions Today    Flowsheet Row Most Recent Value  TOC Interventions   TOC Interventions Discussed/Reviewed TOC Interventions Discussed  [provided my direct contact information should questions/ concerns/ needs arise post-TOC call, prior to RN CM telephone visit  12/06/22]       Interventions Today    Flowsheet Row Most Recent Value  Chronic Disease   Chronic disease during today's visit Other  [CVA]  General Interventions  General Interventions Discussed/Reviewed General Interventions Discussed, Doctor Visits, Referral to Nurse, Communication with  Doctor Visits Discussed/Reviewed Doctor Visits Discussed, PCP, Specialist  PCP/Specialist Visits Compliance with follow-up visit  Communication with PCP/Specialists, RN  Education Interventions   Education Provided Provided Education  Provided Verbal Education On Other, Medication  [most appropriate provider to address need for clarification around Plavix refill,  need to promptly schedule neurology provider office visit,  talking points to cover during upcoming PCP appointment on 11/28/22]  Nutrition Interventions   Nutrition Discussed/Reviewed Nutrition Discussed  Pharmacy Interventions   Pharmacy Dicussed/Reviewed Pharmacy Topics Discussed  [Full medication review with updating medication list in EHR per patient report]      Caryl Pina, RN, BSN, CCRN Alumnus RN CM Care Coordination/ Transition of Care- [facility] Care Management [phone]: direct office

## 2022-11-23 ENCOUNTER — Ambulatory Visit: Payer: Medicare Other | Admitting: Internal Medicine

## 2022-11-23 LAB — VITAMIN B1: Vitamin B1 (Thiamine): 113.1 nmol/L (ref 66.5–200.0)

## 2022-11-28 ENCOUNTER — Encounter: Payer: Self-pay | Admitting: Internal Medicine

## 2022-11-28 ENCOUNTER — Ambulatory Visit (INDEPENDENT_AMBULATORY_CARE_PROVIDER_SITE_OTHER): Payer: Medicare Other | Admitting: Internal Medicine

## 2022-11-28 VITALS — BP 118/70 | HR 65 | Temp 98.1°F | Ht 65.0 in | Wt 131.0 lb

## 2022-11-28 DIAGNOSIS — R9431 Abnormal electrocardiogram [ECG] [EKG]: Secondary | ICD-10-CM | POA: Diagnosis not present

## 2022-11-28 DIAGNOSIS — G301 Alzheimer's disease with late onset: Secondary | ICD-10-CM

## 2022-11-28 DIAGNOSIS — Z8673 Personal history of transient ischemic attack (TIA), and cerebral infarction without residual deficits: Secondary | ICD-10-CM | POA: Diagnosis not present

## 2022-11-28 DIAGNOSIS — E785 Hyperlipidemia, unspecified: Secondary | ICD-10-CM | POA: Diagnosis not present

## 2022-11-28 DIAGNOSIS — H9193 Unspecified hearing loss, bilateral: Secondary | ICD-10-CM | POA: Diagnosis not present

## 2022-11-28 DIAGNOSIS — F02A0 Dementia in other diseases classified elsewhere, mild, without behavioral disturbance, psychotic disturbance, mood disturbance, and anxiety: Secondary | ICD-10-CM | POA: Diagnosis not present

## 2022-11-28 DIAGNOSIS — I10 Essential (primary) hypertension: Secondary | ICD-10-CM

## 2022-11-28 NOTE — Assessment & Plan Note (Signed)
Stable overall, cont current med tx 

## 2022-11-28 NOTE — Patient Instructions (Signed)
Please continue the plan for the 3 wks only of Plavix 75 mg per day  Ok to continue the lipitor 40 mg per day  Please continue all other medications as before, including the Aspriin 81 mg per day indefintetly  Please continue the plan for hearing aids  Please have the pharmacy call with any other refills you may need.  Please continue your efforts at being more active, low cholesterol diet, and weight control.  You are otherwise up to date with prevention measures today.  Please keep your appointments with your specialists as you may have planned - Neurology at 4 wks, and Speech Therapy tomorrow  You will be contacted regarding the referral for: Cardiac CT score  Please make an Appointment to return in 6 months, or sooner if needed

## 2022-11-28 NOTE — Progress Notes (Signed)
Patient ID: Crystal Herrera, female   DOB: 09-27-1940, 82 y.o.   MRN: [id number]        Chief Complaint: follow up recent hospn apr 26-28 2024 with 6 mm acute stroke noted incidentally on MRI, hld, dementia, allergies, bilateral hearing loss       HPI:  Crystal Herrera is a 82 y.o. female here overall doing well.  Pt denies chest pain, increased sob or doe, wheezing, orthopnea, PND, increased LE swelling, palpitations, dizziness or syncope.   Pt denies polydipsia, polyuria, or new focal neuro s/s.    Pt denies fever, wt loss, night sweats, loss of appetite, or other constitutional symptoms  Willing for card CT score.  also with mild worsening bilat hearing loss .  Tolerating new lipitor 40 mg qd,  No over bleeding on plavix for 21 days, continue asa indefinitely.  Has ST tomorrow, and f/u with neurology at 4 wks.         Wt Readings from Last 3 Encounters:  11/28/22 131 lb (59.4 kg)  11/18/22 139 lb 15.9 oz (63.5 kg)  10/11/22 140 lb (63.5 kg)   BP Readings from Last 3 Encounters:  11/28/22 118/70  11/20/22 119/62  10/11/22 134/82         Past Medical History:  Diagnosis Date   Allergic rhinitis, cause unspecified 01/29/2013   Anemia 1948?   BACK PAIN, CHRONIC 02/09/2010   Cancer (HCC)    SKIN   Cataract    COLONIC POLYPS, HX OF 02/09/2010   COMMON MIGRAINE 02/09/2010   neuritis in R side of neck & head   DEGENERATIVE JOINT DISEASE 02/09/2010   hands , lumbar stenosis    DEPRESSION 02/09/2010   pt. takes Cymbalta for pain issues    GERD 02/09/2010   HYPERLIPIDEMIA 02/09/2010   HYPERTENSION 02/09/2010   Lumbar disc disease    Memory loss    Neuromuscular disorder (HCC)    neuritis in head & neck   OCCIPITAL NEURALGIA 06/09/2010   OSTEOPENIA 02/09/2010   Pneumonia    "as an infant" (05/15/2012)   Varicose vein of leg    BLE   Past Surgical History:  Procedure Laterality Date   BREAST BIOPSY  1988   left; Neg   CHOLECYSTECTOMY  ? 2009   chymopapain injection  1986   Lumbar    COLONOSCOPY     DILATION AND CURETTAGE OF UTERUS  1960's   HERNIA REPAIR  05/15/2012   ventral incisional    LUMBAR FUSION  05/22/2013   L2  L3 decompression /fusion      Dr Yevette Edwards   LUMBAR LAMINECTOMY  1986   OVARIAN CYST REMOVAL  2006   Stimulator placed   07/2014   placed 2 years ago for pain   TRANSFORAMINAL LUMBAR INTERBODY FUSION (TLIF) WITH PEDICLE SCREW FIXATION 1 LEVEL Left 05/22/2013   Procedure: TRANSFORAMINAL LUMBAR INTERBODY FUSION (TLIF) WITH PEDICLE SCREW FIXATION 1 LEVEL;  Surgeon: Emilee Hero, MD;  Location: [facility] OR;  Service: Orthopedics;  Laterality: Left;  Left sided lumbar 2-3 Transforaminal lumbar interbody fusion with instrumentation, allograft   TUBAL LIGATION  1970's   VARICOSE VEIN SURGERY  ~ 2008   bilaterally   VENTRAL HERNIA REPAIR  05/15/2012   Procedure: LAPAROSCOPIC VENTRAL HERNIA;  Surgeon: Atilano Ina, MD,FACS;  Location: [facility] OR;  Service: General;  Laterality: N/A;  laparoscopic ventral incisional hernia repair with mesh    reports that she quit smoking about 51 years ago. Her smoking use  included cigarettes. She has a 10.00 pack-year smoking history. She has never used smokeless tobacco. She reports that she does not drink alcohol and does not use drugs. family history includes Breast cancer in her maternal grandmother; COPD in her mother; Cancer in her maternal grandmother, mother, and son; Colon cancer in her maternal aunt; Diabetes in her father; Lung cancer in her mother; Varicose Veins in her mother. Allergies  Allergen Reactions   Cefuroxime     Might cause hives   Cephalosporins     Might cause hives    Latex Nausea Only   Celexa [Citalopram Hydrobromide] Nausea Only   Dexlansoprazole Anxiety    Sleeplessness heart racing headaches chills   Lexapro [Escitalopram Oxalate] Nausea Only   Penicillins Hives   Sulfonamide Derivatives Hives   Vancomycin Rash   Current Outpatient Medications on File Prior to Visit  Medication Sig  Dispense Refill   acetaminophen (TYLENOL) 500 MG tablet Take 500 mg by mouth every 6 (six) hours as needed for mild pain.     aspirin EC 81 MG tablet Take 1 tablet (81 mg total) by mouth daily. Swallow whole. 30 tablet 12   atorvastatin (LIPITOR) 40 MG tablet Take 1 tablet (40 mg total) by mouth daily. 30 tablet 2   Biotin 5000 MCG TABS Take 5,000 mcg by mouth daily.     calcium carbonate (OSCAL) 1500 (600 Ca) MG TABS tablet Take 1,200 mg by mouth daily with breakfast.     Cholecalciferol (VITAMIN D3 PO) Take 2,000 Units by mouth daily.      clopidogrel (PLAVIX) 75 MG tablet Take 1 tablet (75 mg total) by mouth daily. 21 tablet 0   donepezil (ARICEPT) 10 MG tablet Take 1 tablet (10 mg total) by mouth at bedtime. 90 tablet 3   memantine (NAMENDA) 10 MG tablet Take 1 tablet (10 mg total) by mouth 2 (two) times daily. 180 tablet 3   OVER THE COUNTER MEDICATION Take 2 tablets by mouth daily. Youthful Brain     OVER THE COUNTER MEDICATION Take 1 packet by mouth daily. Noobru: L theanine: mix 1 packet of powder with water and drink daily     QUEtiapine (SEROQUEL) 25 MG tablet Take 1 tablet (25 mg total) by mouth 2 (two) times daily. 60 tablet 11   senna-docusate (SENOKOT-S) 8.6-50 MG tablet Take 1 tablet by mouth at bedtime as needed for moderate constipation.     No current facility-administered medications on file prior to visit.        ROS:  All others reviewed and negative.  Objective        PE:  BP 118/70 (BP Location: Right Arm, Patient Position: Sitting, Cuff Size: Normal)   Pulse 65   Temp 98.1 F (36.7 C) (Oral)   Ht 5\' 5"  (1.651 m)   Wt 131 lb (59.4 kg)   SpO2 98%   BMI 21.80 kg/m                 Constitutional: Pt appears in NAD               HENT: Head: NCAT.                Right Ear: External ear normal.                 Left Ear: External ear normal.                Eyes: . Pupils are equal, round, and reactive to  light. Conjunctivae and EOM are normal               Nose:  without d/c or deformity               Neck: Neck supple. Gross normal ROM               Cardiovascular: Normal rate and regular rhythm.                 Pulmonary/Chest: Effort normal and breath sounds without rales or wheezing.                Abd:  Soft, NT, ND, + BS, no organomegaly               Neurological: Pt is alert. At baseline orientation, motor grossly intact               Skin: Skin is warm. No rashes, no other new lesions, LE edema - none               Psychiatric: Pt behavior is normal without agitation   Micro: none  Cardiac tracings I have personally interpreted today:  none  Pertinent Radiological findings (summarize): none   Lab Results  Component Value Date   WBC 5.1 11/19/2022   HGB 13.8 11/19/2022   HCT 41.5 11/19/2022   PLT 196 11/19/2022   GLUCOSE 101 (H) 11/18/2022   CHOL 160 11/19/2022   TRIG 46 11/19/2022   HDL 62 11/19/2022   LDLDIRECT 122.2 02/09/2010   LDLCALC 89 11/19/2022   ALT 16 11/18/2022   AST 23 11/18/2022   NA 138 11/18/2022   K 3.7 11/18/2022   CL 102 11/18/2022   CREATININE 0.84 11/19/2022   BUN 18 11/18/2022   CO2 27 11/18/2022   TSH 3.832 11/19/2022   INR 1.0 11/18/2022   HGBA1C 5.3 11/19/2022   Assessment/Plan:  Crystal Herrera is a 82 y.o. White or Caucasian [1] female with  has a past medical history of Allergic rhinitis, cause unspecified (01/29/2013), Anemia (1948?), BACK PAIN, CHRONIC (02/09/2010), Cancer (HCC), Cataract, COLONIC POLYPS, HX OF (02/09/2010), COMMON MIGRAINE (02/09/2010), DEGENERATIVE JOINT DISEASE (02/09/2010), DEPRESSION (02/09/2010), GERD (02/09/2010), HYPERLIPIDEMIA (02/09/2010), HYPERTENSION (02/09/2010), Lumbar disc disease, Memory loss, Neuromuscular disorder (HCC), OCCIPITAL NEURALGIA (06/09/2010), OSTEOPENIA (02/09/2010), Pneumonia, and Varicose vein of leg.  History of ischemic stroke Recent small 6 mm, stable, to continue plavix x 21 days total, cont asa 81 mg, for ST tomorrow and neurology at 4 wks, also  continue statin  Mild late onset Alzheimer's dementia without behavioral disturbance, psychotic disturbance, mood disturbance, or anxiety (HCC) Stable overall, cont current med tx  Hyperlipidemia Now on lipitor 40 gm qd, cont lower chol diet, for f/u lab next visit, goal ldl < 70  Bilateral hearing loss Mild recent worsening, for otc bilateral hearing aids  Followup: Return in about 6 months (around 05/31/2023).  Oliver Barre, MD 11/28/2022 9:36 PM [facility]

## 2022-11-28 NOTE — Assessment & Plan Note (Signed)
Now on lipitor 40 gm qd, cont lower chol diet, for f/u lab next visit, goal ldl < 70

## 2022-11-28 NOTE — Assessment & Plan Note (Signed)
Recent small 6 mm, stable, to continue plavix x 21 days total, cont asa 81 mg, for ST tomorrow and neurology at 4 wks, also continue statin

## 2022-11-28 NOTE — Assessment & Plan Note (Signed)
Mild recent worsening, for otc bilateral hearing aids

## 2022-11-29 ENCOUNTER — Ambulatory Visit: Payer: Medicare Other | Attending: Internal Medicine

## 2022-11-29 DIAGNOSIS — R4701 Aphasia: Secondary | ICD-10-CM | POA: Insufficient documentation

## 2022-11-29 DIAGNOSIS — R41841 Cognitive communication deficit: Secondary | ICD-10-CM | POA: Insufficient documentation

## 2022-11-29 NOTE — Therapy (Signed)
[place] [facility] [address], [address] [place], [place], [address] Phone: [phone]   Fax:  [phone]  Patient Details  Name: Crystal Herrera MRN: [id number] Date of Birth: 1941-01-12 Referring Provider:  Kathlen Mody, MD Corwin Levins, MD (doc)     Encounter Date: 11/29/2022  ST arrive/cancel Pt and husband arrived and during interview process for ST evaluation, SLP found pt's ability level is at baseline for cognition and language. SLP also learned that pt's residence is closer to [facility] than this clinic. Pt will be following up with Butch Penny, PA, in three weeks. SLP suggested that pt/husband talk with feasibility of ST at that time with [facility], for referral for ST in [facility].   Fishel Wamble, CCC-SLP 11/29/2022, 10:57 AM  [place]  [facility] [address] [place], [place], [address] Phone: [phone]   Fax:  [phone]

## 2022-12-06 ENCOUNTER — Ambulatory Visit: Payer: Self-pay

## 2022-12-06 NOTE — Patient Instructions (Signed)
Visit Information  Thank you for taking time to visit with me today. Please don't hesitate to contact me if I can be of assistance to you.   Following are the goals we discussed today:   Goals Addressed             This Visit's Progress    Continue to improve post hospitalization       Interventions Today    Flowsheet Row Most Recent Value  Chronic Disease   Chronic disease during today's visit Other  [CVA]  General Interventions   General Interventions Discussed/Reviewed General Interventions Discussed, Doctor Visits  Doctor Visits Discussed/Reviewed Doctor Visits Discussed, PCP  PCP/Specialist Visits Compliance with follow-up visit  Exercise Interventions   Exercise Discussed/Reviewed Physical Activity  [reiterated provider recommendation continue efforts to be more active]  Education Interventions   Education Provided Provided Education  Provided Verbal Education On Medication, Other  [encouraged to continue to take medications as prescribed,  attend provider visits as scheduled. reiterated patient instructions from PCP office visit 11/28/22]  Nutrition Interventions   Nutrition Discussed/Reviewed Nutrition Discussed  Pharmacy Interventions   Pharmacy Dicussed/Reviewed Pharmacy Topics Discussed, Medications and their functions, Medication Adherence, Affording Medications  Safety Interventions   Safety Discussed/Reviewed Safety Discussed  [reports does not need cane or walker and walking without difficulty.]            Our next appointment is by telephone on 12/30/22 at 9:30 pm  Please call the care guide team at [phone] if you need to cancel or reschedule your appointment.   If you are experiencing a Mental Health or Behavioral Health Crisis or need someone to talk to, please call the Suicide and Crisis Lifeline: 9  Kathyrn Sheriff, RN, MSN, BSN, CCM [facility] Care Coordinator [phone]

## 2022-12-06 NOTE — Patient Outreach (Signed)
  Care Coordination   Initial Visit Note   12/06/2022 Name: MILIANNA KEIBLER MRN: [id number] DOB: [birth year]  NYROBI HARNOIS is a 82 y.o. year old female who sees Corwin Levins, MD for primary care. I spoke with  Antonieta Loveless by phone today.  What matters to the patients health and wellness today?  CVA admitted 11/18/22-11/20/22. Mrs. Grzeskowiak reports she is doing ok. Mr. Forand reports client attended scheduled SP therapy visit for evaluation and was determined she does not need ST. Office visit with PCP completed on 11/28/22. Mrs. Sankovich denies any questions or concerns. Mr. Berrios is also without questions or concerns today.  Goals Addressed             This Visit's Progress    Continue to improve post hospitalization       Interventions Today    Flowsheet Row Most Recent Value  Chronic Disease   Chronic disease during today's visit Other  [CVA]  General Interventions   General Interventions Discussed/Reviewed General Interventions Discussed, Doctor Visits  Doctor Visits Discussed/Reviewed Doctor Visits Discussed, PCP  PCP/Specialist Visits Compliance with follow-up visit  Exercise Interventions   Exercise Discussed/Reviewed Physical Activity  [reiterated provider recommendation continue efforts to be more active]  Education Interventions   Education Provided Provided Education  Provided Verbal Education On Medication, Other  [encouraged to continue to take medications as prescribed,  attend provider visits as scheduled. reiterated patient instructions from PCP office visit 11/28/22]  Nutrition Interventions   Nutrition Discussed/Reviewed Nutrition Discussed  Pharmacy Interventions   Pharmacy Dicussed/Reviewed Pharmacy Topics Discussed, Medications and their functions, Medication Adherence, Affording Medications  Safety Interventions   Safety Discussed/Reviewed Safety Discussed  [reports does not need cane or walker and walking without difficulty.]             SDOH assessments and interventions completed:  No recently completed.   Care Coordination Interventions:  Yes, provided   Follow up plan: Follow up call scheduled for 12/30/22    Encounter Outcome:  Pt. Visit Completed   Kathyrn Sheriff, RN, MSN, BSN, CCM [facility] Care Coordinator [phone]

## 2022-12-08 ENCOUNTER — Telehealth: Payer: Self-pay | Admitting: Internal Medicine

## 2022-12-08 ENCOUNTER — Ambulatory Visit (HOSPITAL_BASED_OUTPATIENT_CLINIC_OR_DEPARTMENT_OTHER)
Admission: RE | Admit: 2022-12-08 | Discharge: 2022-12-08 | Disposition: A | Discharge status: Home / Self Care | Payer: Medicare Other | Source: Ambulatory Visit | Attending: Internal Medicine | Admitting: Internal Medicine

## 2022-12-08 DIAGNOSIS — H9193 Unspecified hearing loss, bilateral: Secondary | ICD-10-CM

## 2022-12-08 DIAGNOSIS — Z8673 Personal history of transient ischemic attack (TIA), and cerebral infarction without residual deficits: Secondary | ICD-10-CM | POA: Insufficient documentation

## 2022-12-08 DIAGNOSIS — E785 Hyperlipidemia, unspecified: Secondary | ICD-10-CM | POA: Insufficient documentation

## 2022-12-08 DIAGNOSIS — I10 Essential (primary) hypertension: Secondary | ICD-10-CM

## 2022-12-08 DIAGNOSIS — R9431 Abnormal electrocardiogram [ECG] [EKG]: Secondary | ICD-10-CM

## 2022-12-08 NOTE — Telephone Encounter (Signed)
Patient needs a referral to: [facility]   Phone:  [phone]

## 2022-12-08 NOTE — Telephone Encounter (Signed)
Ok this is done 

## 2022-12-09 ENCOUNTER — Other Ambulatory Visit: Payer: Self-pay | Admitting: Internal Medicine

## 2022-12-09 DIAGNOSIS — R931 Abnormal findings on diagnostic imaging of heart and coronary circulation: Secondary | ICD-10-CM

## 2022-12-09 NOTE — Telephone Encounter (Signed)
Called pt and informed her referral was placed. Pt verbalized understanding

## 2022-12-14 ENCOUNTER — Other Ambulatory Visit: Payer: Self-pay | Admitting: Internal Medicine

## 2022-12-14 ENCOUNTER — Encounter: Payer: Self-pay | Admitting: Adult Health

## 2022-12-14 DIAGNOSIS — R931 Abnormal findings on diagnostic imaging of heart and coronary circulation: Secondary | ICD-10-CM

## 2022-12-14 NOTE — Telephone Encounter (Signed)
The hospital discharge summary says for patient to take aspirin 81 mg and Plavix (clopidogrel) 75 mg daily for 21 days followed by aspirin 81 mg daily so the Clopidogrel prescription will not need to be renewed once she runs out. I called the pt's husband Fayrene Fearing (on [place]) and let him know the plan. He verbalized understanding and appreciation. Will f/u in office on 6/4 as already scheduled. I called [facility] pharmacy and let them know as well.

## 2022-12-18 ENCOUNTER — Other Ambulatory Visit: Payer: Self-pay | Admitting: Neurology

## 2022-12-21 ENCOUNTER — Encounter: Payer: Self-pay | Admitting: Cardiology

## 2022-12-21 ENCOUNTER — Ambulatory Visit: Payer: Medicare Other | Attending: Cardiology | Admitting: Cardiology

## 2022-12-21 VITALS — BP 116/66 | HR 61 | Ht 65.0 in | Wt 130.4 lb

## 2022-12-21 DIAGNOSIS — E7800 Pure hypercholesterolemia, unspecified: Secondary | ICD-10-CM | POA: Diagnosis not present

## 2022-12-21 DIAGNOSIS — R931 Abnormal findings on diagnostic imaging of heart and coronary circulation: Secondary | ICD-10-CM | POA: Diagnosis not present

## 2022-12-21 NOTE — Progress Notes (Signed)
Cardiology Office Note:    Date:  12/21/2022   ID:  Crystal Herrera, DOB 13-Mar-1941, MRN [id number]  PCP:  Corwin Levins, MD   [facility] Providers Cardiologist:  Donato Schultz, MD     Referring MD: Corwin Levins, MD    History of Present Illness:    Crystal Herrera is a 82 y.o. female here for the evaluation of elevated coronary calcium score at the request of Dr. Jonny Ruiz.  Coronary calcium score of 667. This was 22 percentile for age-, race-, and sex-matched controls.  Denies chest pain shortness of breath dyspnea on exertion.  Past Medical History:  Diagnosis Date   Allergic rhinitis, cause unspecified 01/29/2013   Anemia 1948?   BACK PAIN, CHRONIC 02/09/2010   Cancer (HCC)    SKIN   Cataract    COLONIC POLYPS, HX OF 02/09/2010   COMMON MIGRAINE 02/09/2010   neuritis in R side of neck & head   DEGENERATIVE JOINT DISEASE 02/09/2010   hands , lumbar stenosis    DEPRESSION 02/09/2010   pt. takes Cymbalta for pain issues    GERD 02/09/2010   HYPERLIPIDEMIA 02/09/2010   HYPERTENSION 02/09/2010   Lumbar disc disease    Memory loss    Neuromuscular disorder (HCC)    neuritis in head & neck   OCCIPITAL NEURALGIA 06/09/2010   OSTEOPENIA 02/09/2010   Pneumonia    "as an infant" (05/15/2012)   Varicose vein of leg    BLE    Past Surgical History:  Procedure Laterality Date   BREAST BIOPSY  1988   left; Neg   CHOLECYSTECTOMY  ? 2009   chymopapain injection  1986   Lumbar   COLONOSCOPY     DILATION AND CURETTAGE OF UTERUS  1960's   HERNIA REPAIR  05/15/2012   ventral incisional    LUMBAR FUSION  05/22/2013   L2  L3 decompression /fusion      Dr Yevette Edwards   LUMBAR LAMINECTOMY  1986   OVARIAN CYST REMOVAL  2006   Stimulator placed   07/2014   placed 2 years ago for pain   TRANSFORAMINAL LUMBAR INTERBODY FUSION (TLIF) WITH PEDICLE SCREW FIXATION 1 LEVEL Left 05/22/2013   Procedure: TRANSFORAMINAL LUMBAR INTERBODY FUSION (TLIF) WITH PEDICLE SCREW FIXATION 1  LEVEL;  Surgeon: Emilee Hero, MD;  Location: [facility] OR;  Service: Orthopedics;  Laterality: Left;  Left sided lumbar 2-3 Transforaminal lumbar interbody fusion with instrumentation, allograft   TUBAL LIGATION  1970's   VARICOSE VEIN SURGERY  ~ 2008   bilaterally   VENTRAL HERNIA REPAIR  05/15/2012   Procedure: LAPAROSCOPIC VENTRAL HERNIA;  Surgeon: Atilano Ina, MD,FACS;  Location: [facility] OR;  Service: General;  Laterality: N/A;  laparoscopic ventral incisional hernia repair with mesh    Current Medications: Current Meds  Medication Sig   acetaminophen (TYLENOL) 500 MG tablet Take 500 mg by mouth every 6 (six) hours as needed for mild pain.   aspirin EC 81 MG tablet Take 1 tablet (81 mg total) by mouth daily. Swallow whole.   atorvastatin (LIPITOR) 40 MG tablet Take 1 tablet (40 mg total) by mouth daily.   Biotin 5000 MCG TABS Take 5,000 mcg by mouth daily.   calcium carbonate (OSCAL) 1500 (600 Ca) MG TABS tablet Take 1,200 mg by mouth daily with breakfast.   Cholecalciferol (VITAMIN D3 PO) Take 2,000 Units by mouth daily.    donepezil (ARICEPT) 10 MG tablet Take 1 tablet (10 mg total) by mouth  at bedtime.   memantine (NAMENDA) 10 MG tablet TAKE 1 TABLET BY MOUTH TWICE DAILY   OVER THE COUNTER MEDICATION Take 2 tablets by mouth daily. Youthful Brain   senna-docusate (SENOKOT-S) 8.6-50 MG tablet Take 1 tablet by mouth at bedtime as needed for moderate constipation.   vitamin B-12 (CYANOCOBALAMIN) 500 MCG tablet Take 500 mcg by mouth daily.   [DISCONTINUED] OVER THE COUNTER MEDICATION Take 2 tablets by mouth daily. Youthful Brain   [DISCONTINUED] OVER THE COUNTER MEDICATION Take 1 packet by mouth daily. Noobru: L theanine: mix 1 packet of powder with water and drink daily   [DISCONTINUED] QUEtiapine (SEROQUEL) 25 MG tablet Take 1 tablet (25 mg total) by mouth 2 (two) times daily.     Allergies:   Cefuroxime, Cephalosporins, Latex, Celexa [citalopram hydrobromide], Dexlansoprazole, Lexapro  [escitalopram oxalate], Penicillins, Sulfonamide derivatives, and Vancomycin   Social History   Socioeconomic History   Marital status: Married    Spouse name: Not on file   Number of children: 3   Years of education: 12   Highest education level: 12th grade  Occupational History   Occupation: Retired  Tobacco Use   Smoking status: Former    Packs/day: 1.00    Years: 10.00    Additional pack years: 0.00    Total pack years: 10.00    Types: Cigarettes    Quit date: 05/11/1971    Years since quitting: 51.6   Smokeless tobacco: Never  Vaping Use   Vaping Use: Never used  Substance and Sexual Activity   Alcohol use: No    Alcohol/week: 3.0 standard drinks of alcohol    Types: 3 Glasses of wine per week    Comment: glass of wine a few times each week   Drug use: No   Sexual activity: Not Currently  Other Topics Concern   Not on file  Social History Narrative   Lives at home with husband.   Right-handed.   Occasional cup of tea.   Social Determinants of Health   Financial Resource Strain: Low Risk  (11/27/2022)   Overall Financial Resource Strain (CARDIA)    Difficulty of Paying Living Expenses: Not hard at all  Food Insecurity: No Food Insecurity (11/27/2022)   Hunger Vital Sign    Worried About Running Out of Food in the Last Year: Never true    Ran Out of Food in the Last Year: Never true  Transportation Needs: No Transportation Needs (11/27/2022)   PRAPARE - Administrator, Civil Service (Medical): No    Lack of Transportation (Non-Medical): No  Physical Activity: Insufficiently Active (11/27/2022)   Exercise Vital Sign    Days of Exercise per Week: 3 days    Minutes of Exercise per Session: 20 min  Stress: No Stress Concern Present (11/27/2022)   [employer] of Occupational Health - Occupational Stress Questionnaire    Feeling of Stress : Not at all  Social Connections: Unknown (11/27/2022)   Social Connection and Isolation Panel [NHANES]    Frequency  of Communication with Friends and Family: Patient declined    Frequency of Social Gatherings with Friends and Family: Patient declined    Attends Religious Services: Patient declined    Database administrator or Organizations: No    Attends Engineer, structural: Not on file    Marital Status: Married     Family History: The patient's family history includes Breast cancer in her maternal grandmother; COPD in her mother; Cancer in her maternal grandmother,  mother, and son; Colon cancer in her maternal aunt; Diabetes in her father; Lung cancer in her mother; Varicose Veins in her mother.  ROS:   Please see the history of present illness.     All other systems reviewed and are negative.  EKGs/Labs/Other Studies Reviewed:    The following studies were reviewed today: Cardiac Studies & Procedures       ECHOCARDIOGRAM  ECHOCARDIOGRAM COMPLETE 11/20/2022  Narrative ECHOCARDIOGRAM REPORT    Patient Name:   DANNY PONCEDELEON Date of Exam: 11/20/2022 Medical Rec #:  [id number]          Height:       65.0 in Accession #:    [id number]         Weight:       140.0 lb Date of Birth:  May 21, 1941          BSA:          1.700 m Patient Age:    81 years           BP:           153/68 mmHg Patient Gender: F                  HR:           52 bpm. Exam Location:  Inpatient  Procedure: 2D Echo, Cardiac Doppler and Color Doppler  Indications:    Sroke  History:        Patient has prior history of Echocardiogram examinations, most recent 01/22/2015. Stroke, Signs/Symptoms:Alzheimer's; Risk Factors:Dyslipidemia.  Sonographer:    Wallie Char Referring Phys: Rometta Emery  IMPRESSIONS   1. Left ventricular ejection fraction, by estimation, is 55 to 60%. The left ventricle has normal function. The left ventricle has no regional wall motion abnormalities. Left ventricular diastolic parameters are consistent with Grade I diastolic dysfunction (impaired relaxation). 2. Right  ventricular systolic function is normal. The right ventricular size is normal. 3. The mitral valve is abnormal. Mild mitral valve regurgitation. No evidence of mitral stenosis. 4. The aortic valve is tricuspid. There is mild calcification of the aortic valve. There is mild thickening of the aortic valve. Aortic valve regurgitation is not visualized. Aortic valve sclerosis is present, with no evidence of aortic valve stenosis. 5. The inferior vena cava is normal in size with greater than 50% respiratory variability, suggesting right atrial pressure of 3 mmHg.  FINDINGS Left Ventricle: Left ventricular ejection fraction, by estimation, is 55 to 60%. The left ventricle has normal function. The left ventricle has no regional wall motion abnormalities. The left ventricular internal cavity size was normal in size. There is no left ventricular hypertrophy. Left ventricular diastolic parameters are consistent with Grade I diastolic dysfunction (impaired relaxation).  Right Ventricle: The right ventricular size is normal. No increase in right ventricular wall thickness. Right ventricular systolic function is normal.  Left Atrium: Left atrial size was normal in size.  Right Atrium: Right atrial size was normal in size.  Pericardium: There is no evidence of pericardial effusion.  Mitral Valve: The mitral valve is abnormal. There is mild thickening of the mitral valve leaflet(s). There is mild calcification of the mitral valve leaflet(s). Mild mitral annular calcification. Mild mitral valve regurgitation. No evidence of mitral valve stenosis. MV peak gradient, 3.0 mmHg. The mean mitral valve gradient is 1.0 mmHg.  Tricuspid Valve: The tricuspid valve is normal in structure. Tricuspid valve regurgitation is mild . No evidence of tricuspid stenosis.  Aortic Valve:  The aortic valve is tricuspid. There is mild calcification of the aortic valve. There is mild thickening of the aortic valve. Aortic valve  regurgitation is not visualized. Aortic valve sclerosis is present, with no evidence of aortic valve stenosis. Aortic valve mean gradient measures 3.0 mmHg. Aortic valve peak gradient measures 5.5 mmHg. Aortic valve area, by VTI measures 1.22 cm.  Pulmonic Valve: The pulmonic valve was normal in structure. Pulmonic valve regurgitation is not visualized. No evidence of pulmonic stenosis.  Aorta: The aortic root is normal in size and structure.  Venous: The inferior vena cava is normal in size with greater than 50% respiratory variability, suggesting right atrial pressure of 3 mmHg.  IAS/Shunts: The interatrial septum appears to be lipomatous. No atrial level shunt detected by color flow Doppler.   LEFT VENTRICLE PLAX 2D LVIDd:         4.10 cm     Diastology LVIDs:         2.80 cm     LV e' medial:    5.80 cm/s LV PW:         0.80 cm     LV E/e' medial:  11.6 LV IVS:        0.80 cm     LV e' lateral:   7.50 cm/s LVOT diam:     1.50 cm     LV E/e' lateral: 9.0 LV SV:         34 LV SV Index:   20 LVOT Area:     1.77 cm  LV Volumes (MOD) LV vol d, MOD A2C: 56.4 ml LV vol d, MOD A4C: 59.2 ml LV vol s, MOD A2C: 21.7 ml LV vol s, MOD A4C: 23.2 ml LV SV MOD A2C:     34.7 ml LV SV MOD A4C:     59.2 ml LV SV MOD BP:      35.6 ml  RIGHT VENTRICLE             IVC RV Basal diam:  3.50 cm     IVC diam: 1.10 cm RV S prime:     11.40 cm/s TAPSE (M-mode): 2.4 cm  LEFT ATRIUM             Index        RIGHT ATRIUM           Index LA diam:        2.30 cm 1.35 cm/m   RA Area:     12.70 cm LA Vol (A2C):   29.2 ml 17.18 ml/m  RA Volume:   30.10 ml  17.71 ml/m LA Vol (A4C):   34.7 ml 20.41 ml/m LA Biplane Vol: 35.0 ml 20.59 ml/m AORTIC VALVE AV Area (Vmax):    1.18 cm AV Area (Vmean):   1.16 cm AV Area (VTI):     1.22 cm AV Vmax:           117.00 cm/s AV Vmean:          84.450 cm/s AV VTI:            0.276 m AV Peak Grad:      5.5 mmHg AV Mean Grad:      3.0 mmHg LVOT Vmax:          77.85 cm/s LVOT Vmean:        55.250 cm/s LVOT VTI:          0.190 m LVOT/AV VTI ratio: 0.69  AORTA Ao Root diam: 2.70 cm  Ao Asc diam:  3.10 cm  MITRAL VALVE               TRICUSPID VALVE MV Area (PHT): 2.48 cm    TR Peak grad:   21.5 mmHg MV Area VTI:   1.24 cm    TR Vmax:        232.00 cm/s MV Peak grad:  3.0 mmHg MV Mean grad:  1.0 mmHg    SHUNTS MV Vmax:       0.86 m/s    Systemic VTI:  0.19 m MV Vmean:      44.3 cm/s   Systemic Diam: 1.50 cm MV Decel Time: 306 msec MV E velocity: 67.20 cm/s MV A velocity: 80.60 cm/s MV E/A ratio:  0.83  Charlton Haws MD Electronically signed by Charlton Haws MD Signature Date/Time: 11/20/2022/10:44:24 AM    Final     CT SCANS  CT CARDIAC SCORING (SELF PAY ONLY) 12/13/2022  Addendum 12/13/2022  9:34 PM ADDENDUM REPORT: 12/13/2022 21:31  EXAM: OVER-READ INTERPRETATION  CT CHEST  The following report is an over-read performed by radiologist Dr. Arnoldo Morale [facility] on 12/13/2022. This over-read does not include interpretation of cardiac or coronary anatomy or pathology. The coronary calcium score interpretation by the cardiologist is attached.  COMPARISON:  Chest radiograph dated 05/22/2013.  FINDINGS: Scattered clusters of nodular density with tree-in-bud appearance bilaterally likely post inflammatory/infectious in etiology and of indeterminate chronicity. Clinical correlation and follow-up with chest CT in 3 months recommended. Right hilar calcified granuloma.  IMPRESSION: Scattered clusters of nodular density with tree-in-bud appearance bilaterally likely post inflammatory/infectious in etiology. Follow-up with chest CT in 3 months recommended.   Electronically Signed By: Elgie Collard M.D. On: 12/13/2022 21:31  Narrative : Cardiovascular Disease Risk stratification  EXAM: Coronary Calcium Score  TECHNIQUE: A gated, non-contrast computed tomography scan of the heart was performed  using 3mm slice thickness. Axial images were analyzed on a dedicated workstation. Calcium scoring of the coronary arteries was performed using the Agatston method.  FINDINGS: Coronary arteries: Normal origins.  Coronary Calcium Score:  Left main: 0  Left anterior descending artery: 466  Left circumflex artery: 32.8  Right coronary artery: 168  Total: 667  Percentile: 82  Pericardium: Normal.  Ascending Aorta: Normal caliber.  Non-cardiac: See separate report from [facility].  IMPRESSION: Coronary calcium score of 667. This was 20 percentile for age-, race-, and sex-matched controls.  RECOMMENDATIONS: Coronary artery calcium (CAC) score is a strong predictor of incident coronary heart disease (CHD) and provides predictive information beyond traditional risk factors. CAC scoring is reasonable to use in the decision to withhold, postpone, or initiate statin therapy in intermediate-risk or selected borderline-risk asymptomatic adults (age 51-75 years and LDL-C >=70 to <190 mg/dL) who do not have diabetes or established atherosclerotic cardiovascular disease (ASCVD).* In intermediate-risk (10-year ASCVD risk >=7.5% to <20%) adults or selected borderline-risk (10-year ASCVD risk >=5% to <7.5%) adults in whom a CAC score is measured for the purpose of making a treatment decision the following recommendations have been made:  If CAC=0, it is reasonable to withhold statin therapy and reassess in 5 to 10 years, as long as higher risk conditions are absent (diabetes mellitus, family history of premature CHD in first degree relatives (males <55 years; females <65 years), cigarette smoking, or LDL >=190 mg/dL).  If CAC is 1 to 99, it is reasonable to initiate statin therapy for patients >=2 years of age.  If CAC is >=100 or >=75th  percentile, it is reasonable to initiate statin therapy at any age.  Cardiology referral should be considered for patients with  CAC scores >=400 or >=75th percentile.  *2018 AHA/ACC/AACVPR/AAPA/ABC/ACPM/ADA/AGS/APhA/ASPC/NLA/PCNA Guideline on the Management of Blood Cholesterol: A Report of the American College of Cardiology/American Heart Association Task Force on Clinical Practice Guidelines. J Am Coll Cardiol. 2019;73(24):3168-3209.  Thomasene Ripple, DO  The noncardiac portion of this study will be interpreted in separate report by the radiologist.  Electronically Signed: By: Thomasene Ripple D.O. On: 12/08/2022 18:12           EKG:  The ekg ordered today demonstrates 12/21/2022 normal sinus rhythm 61 no other changes  Recent Labs: 11/18/2022: ALT 16; BUN 18; Potassium 3.7; Sodium 138 11/19/2022: Creatinine, Ser 0.84; Hemoglobin 13.8; Platelets 196; TSH 3.832  Recent Lipid Panel    Component Value Date/Time   CHOL 160 11/19/2022 0335   TRIG 46 11/19/2022 0335   HDL 62 11/19/2022 0335   CHOLHDL 2.6 11/19/2022 0335   VLDL 9 11/19/2022 0335   LDLCALC 89 11/19/2022 0335   LDLDIRECT 122.2 02/09/2010 1404     Risk Assessment/Calculations:               Physical Exam:    VS:  BP 116/66   Pulse 61   Ht 5\' 5"  (1.651 m)   Wt 130 lb 6.4 oz (59.1 kg)   SpO2 96%   BMI 21.70 kg/m     Wt Readings from Last 3 Encounters:  12/21/22 130 lb 6.4 oz (59.1 kg)  11/28/22 131 lb (59.4 kg)  11/18/22 139 lb 15.9 oz (63.5 kg)     GEN:  Well nourished, well developed in no acute distress HEENT: Normal NECK: No JVD; No carotid bruits LYMPHATICS: No lymphadenopathy CARDIAC: RRR, 1/6 systolic murmur, rubs, gallops RESPIRATORY:  Clear to auscultation without rales, wheezing or rhonchi  ABDOMEN: Soft, non-tender, non-distended MUSCULOSKELETAL:  No edema; No deformity  SKIN: Warm and dry NEUROLOGIC:  Alert and oriented x 3 PSYCHIATRIC:  Normal affect   ASSESSMENT:    1. Elevated coronary artery calcium score   2. Pure hypercholesterolemia    PLAN:    In order of problems listed above:  Coronary artery  disease/coronary artery calcification - Continue with secondary risk factor prevention.  Asymptomatic.  Plaque stabilization.  Excellent use of atorvastatin 40 mg as well as aspirin 81 mg.  No signs of bleeding.  Continue.  Activity lifestyle modifications.  Hyperlipidemia - Ideally would like to see the LDL less than 70.  Currently 88 on atorvastatin 40 mg which is high intensity statin.  Lets continue with current dosing.  Excellent  Aortic sclerosis - 1/6 systolic murmur heard, echocardiogram reviewed, No evidence of stenosis  Memory impairment - On Aricept and Namenda.          Medication Adjustments/Labs and Tests Ordered: Current medicines are reviewed at length with the patient today.  Concerns regarding medicines are outlined above.  Orders Placed This Encounter  Procedures   EKG 12-Lead   No orders of the defined types were placed in this encounter.   Patient Instructions  Medication Instructions:  The current medical regimen is effective;  continue present plan and medications.  *If you need a refill on your cardiac medications before your next appointment, please call your pharmacy*  Follow-Up: At [facility], you and your health needs are our priority.  As part of our continuing mission to provide you with exceptional heart care, we have created designated  Provider Care Teams.  These Care Teams include your primary Cardiologist (physician) and Advanced Practice Providers (APPs -  Physician Assistants and Nurse Practitioners) who all work together to provide you with the care you need, when you need it.  We recommend signing up for the patient portal called "MyChart".  Sign up information is provided on this After Visit Summary.  MyChart is used to connect with patients for Virtual Visits (Telemedicine).  Patients are able to view lab/test results, encounter notes, upcoming appointments, etc.  Non-urgent messages can be sent to your provider as well.   To  learn more about what you can do with MyChart, go to [email or web address].    Your next appointment:   1 year(s)  Provider:   Dr Donato Schultz       Signed, Donato Schultz, MD  12/21/2022 3:31 PM    [facility]

## 2022-12-21 NOTE — Patient Instructions (Signed)
Medication Instructions:  The current medical regimen is effective;  continue present plan and medications.  *If you need a refill on your cardiac medications before your next appointment, please call your pharmacy*   Follow-Up: At [facility], you and your health needs are our priority.  As part of our continuing mission to provide you with exceptional heart care, we have created designated Provider Care Teams.  These Care Teams include your primary Cardiologist (physician) and Advanced Practice Providers (APPs -  Physician Assistants and Nurse Practitioners) who all work together to provide you with the care you need, when you need it.  We recommend signing up for the patient portal called "MyChart".  Sign up information is provided on this After Visit Summary.  MyChart is used to connect with patients for Virtual Visits (Telemedicine).  Patients are able to view lab/test results, encounter notes, upcoming appointments, etc.  Non-urgent messages can be sent to your provider as well.   To learn more about what you can do with MyChart, go to [email or web address].    Your next appointment:   1 year(s)  Provider:   Dr Mark Skains      

## 2022-12-27 ENCOUNTER — Ambulatory Visit (INDEPENDENT_AMBULATORY_CARE_PROVIDER_SITE_OTHER): Payer: Medicare Other | Admitting: Adult Health

## 2022-12-27 ENCOUNTER — Encounter: Payer: Self-pay | Admitting: Adult Health

## 2022-12-27 VITALS — BP 126/77 | HR 63 | Ht 65.0 in | Wt 127.0 lb

## 2022-12-27 DIAGNOSIS — F02A0 Dementia in other diseases classified elsewhere, mild, without behavioral disturbance, psychotic disturbance, mood disturbance, and anxiety: Secondary | ICD-10-CM

## 2022-12-27 DIAGNOSIS — I639 Cerebral infarction, unspecified: Secondary | ICD-10-CM

## 2022-12-27 DIAGNOSIS — H903 Sensorineural hearing loss, bilateral: Secondary | ICD-10-CM | POA: Diagnosis not present

## 2022-12-27 DIAGNOSIS — G301 Alzheimer's disease with late onset: Secondary | ICD-10-CM

## 2022-12-27 DIAGNOSIS — H9313 Tinnitus, bilateral: Secondary | ICD-10-CM | POA: Diagnosis not present

## 2022-12-27 NOTE — Progress Notes (Signed)
PATIENT: Crystal Herrera DOB: 1940-11-04  REASON FOR VISIT: follow up HISTORY FROM: patient PRIMARY NEUROLOGIST: Dr. Terrace Arabia   Chief Complaint  Patient presents with   Follow-up    Pt in 19 with husband  Pt here for stroke f/u Husband states pt memory is worse since last visit      HISTORY OF PRESENT ILLNESS:   Today 12/27/22  Crystal Herrera is a 82 y.o. female who has been followed in this office for memory disturbance.  She returns today for follow-up for recent hospitalization due to infarct.  She reports that her primary care did an MRI due to cognitive changes and they saw the small infarct and sent her to the hospital.  She states that she did not have any signs and symptoms of a stroke.  She does not feel that her memory has changed much since her stroke.  She remains on just aspirin.  Primary care is managing blood pressure and cholesterol.  She has been followed in this office by Dr. Terrace Arabia for memory disturbance.  Currently on Aricept and Namenda.  At home she is able to complete all ADLs independently.  She manages her own medications however her husband is there if she has questions.  She does not operate a motor vehicle.  Denies any trouble sleeping.  HISTORY Crystal Herrera is a 82 y.o. female with history of demential, HLD, LBP s/p spinal cord stimulator admitted for progressive cognitive decline. No tPA given due to outside window.     Stroke:  left caudate head small infarct embolic secondary to small vessel disease source CT no acute abnormalities CTA head and neck unremarkable MRI  small left caudate head infarct 2D Echo  pending LDL 89 HgbA1c 5.3 Lovenox for VTE prophylaxis No antithrombotic prior to admission, now on aspirin 81 mg daily and clopidogrel 75 mg daily DAPT for 3 weeks and then ASA alone. Patient counseled to be compliant with her antithrombotic medications Ongoing aggressive stroke risk factor management Therapy recommendations:   pending Disposition:  pending  REVIEW OF SYSTEMS: Out of a complete 14 system review of symptoms, the patient complains only of the following symptoms, and all other reviewed systems are negative.  ALLERGIES: Allergies  Allergen Reactions   Cefuroxime     Might cause hives   Cephalosporins     Might cause hives    Latex Nausea Only   Celexa [Citalopram Hydrobromide] Nausea Only   Dexlansoprazole Anxiety    Sleeplessness heart racing headaches chills   Lexapro [Escitalopram Oxalate] Nausea Only   Penicillins Hives   Sulfonamide Derivatives Hives   Vancomycin Rash    HOME MEDICATIONS: Outpatient Medications Prior to Visit  Medication Sig Dispense Refill   acetaminophen (TYLENOL) 500 MG tablet Take 500 mg by mouth every 6 (six) hours as needed for mild pain.     aspirin EC 81 MG tablet Take 1 tablet (81 mg total) by mouth daily. Swallow whole. 30 tablet 12   atorvastatin (LIPITOR) 40 MG tablet Take 1 tablet (40 mg total) by mouth daily. 30 tablet 2   Biotin 5000 MCG TABS Take 5,000 mcg by mouth daily.     calcium carbonate (OSCAL) 1500 (600 Ca) MG TABS tablet Take 1,200 mg by mouth daily with breakfast.     Cholecalciferol (VITAMIN D3 PO) Take 2,000 Units by mouth daily.      donepezil (ARICEPT) 10 MG tablet Take 1 tablet (10 mg total) by mouth at bedtime. 90 tablet  3   memantine (NAMENDA) 10 MG tablet TAKE 1 TABLET BY MOUTH TWICE DAILY 180 tablet 3   OVER THE COUNTER MEDICATION Take 2 tablets by mouth daily. Youthful Brain     vitamin B-12 (CYANOCOBALAMIN) 500 MCG tablet Take 500 mcg by mouth daily.     clopidogrel (PLAVIX) 75 MG tablet Take 1 tablet (75 mg total) by mouth daily. 21 tablet 0   senna-docusate (SENOKOT-S) 8.6-50 MG tablet Take 1 tablet by mouth at bedtime as needed for moderate constipation.     No facility-administered medications prior to visit.    PAST MEDICAL HISTORY: Past Medical History:  Diagnosis Date   Allergic rhinitis, cause unspecified 01/29/2013    Anemia 1948?   BACK PAIN, CHRONIC 02/09/2010   Cancer (HCC)    SKIN   Cataract    COLONIC POLYPS, HX OF 02/09/2010   COMMON MIGRAINE 02/09/2010   neuritis in R side of neck & head   DEGENERATIVE JOINT DISEASE 02/09/2010   hands , lumbar stenosis    DEPRESSION 02/09/2010   pt. takes Cymbalta for pain issues    GERD 02/09/2010   HYPERLIPIDEMIA 02/09/2010   HYPERTENSION 02/09/2010   Lumbar disc disease    Memory loss    Neuromuscular disorder (HCC)    neuritis in head & neck   OCCIPITAL NEURALGIA 06/09/2010   OSTEOPENIA 02/09/2010   Pneumonia    "as an infant" (05/15/2012)   Varicose vein of leg    BLE    PAST SURGICAL HISTORY: Past Surgical History:  Procedure Laterality Date   BREAST BIOPSY  1988   left; Neg   CHOLECYSTECTOMY  ? 2009   chymopapain injection  1986   Lumbar   COLONOSCOPY     DILATION AND CURETTAGE OF UTERUS  1960's   HERNIA REPAIR  05/15/2012   ventral incisional    LUMBAR FUSION  05/22/2013   L2  L3 decompression /fusion      Dr Yevette Edwards   LUMBAR LAMINECTOMY  1986   OVARIAN CYST REMOVAL  2006   Stimulator placed   07/2014   placed 2 years ago for pain   TRANSFORAMINAL LUMBAR INTERBODY FUSION (TLIF) WITH PEDICLE SCREW FIXATION 1 LEVEL Left 05/22/2013   Procedure: TRANSFORAMINAL LUMBAR INTERBODY FUSION (TLIF) WITH PEDICLE SCREW FIXATION 1 LEVEL;  Surgeon: Emilee Hero, MD;  Location: [facility] OR;  Service: Orthopedics;  Laterality: Left;  Left sided lumbar 2-3 Transforaminal lumbar interbody fusion with instrumentation, allograft   TUBAL LIGATION  1970's   VARICOSE VEIN SURGERY  ~ 2008   bilaterally   VENTRAL HERNIA REPAIR  05/15/2012   Procedure: LAPAROSCOPIC VENTRAL HERNIA;  Surgeon: Atilano Ina, MD,FACS;  Location: [facility] OR;  Service: General;  Laterality: N/A;  laparoscopic ventral incisional hernia repair with mesh    FAMILY HISTORY: Family History  Problem Relation Age of Onset   Lung cancer Mother    Cancer Mother        Lung   COPD Mother     Varicose Veins Mother    Diabetes Father    Colon cancer Maternal Aunt    Breast cancer Maternal Grandmother    Cancer Maternal Grandmother        breast   Cancer Son        Melanoma   Stroke Neg Hx    Dementia Neg Hx    Alzheimer's disease Neg Hx     SOCIAL HISTORY: Social History   Socioeconomic History   Marital status: Married  Spouse name: Not on file   Number of children: 3   Years of education: 108   Highest education level: 12th grade  Occupational History   Occupation: Retired  Tobacco Use   Smoking status: Former    Packs/day: 1.00    Years: 10.00    Additional pack years: 0.00    Total pack years: 10.00    Types: Cigarettes    Quit date: 05/11/1971    Years since quitting: 51.6   Smokeless tobacco: Never  Vaping Use   Vaping Use: Never used  Substance and Sexual Activity   Alcohol use: No    Alcohol/week: 3.0 standard drinks of alcohol    Types: 3 Glasses of wine per week    Comment: glass of wine a few times each week   Drug use: No   Sexual activity: Not Currently  Other Topics Concern   Not on file  Social History Narrative   Lives at home with husband.   Right-handed.   Occasional cup of tea.   Social Determinants of Health   Financial Resource Strain: Low Risk  (11/27/2022)   Overall Financial Resource Strain (CARDIA)    Difficulty of Paying Living Expenses: Not hard at all  Food Insecurity: No Food Insecurity (11/27/2022)   Hunger Vital Sign    Worried About Running Out of Food in the Last Year: Never true    Ran Out of Food in the Last Year: Never true  Transportation Needs: No Transportation Needs (11/27/2022)   PRAPARE - Administrator, Civil Service (Medical): No    Lack of Transportation (Non-Medical): No  Physical Activity: Insufficiently Active (11/27/2022)   Exercise Vital Sign    Days of Exercise per Week: 3 days    Minutes of Exercise per Session: 20 min  Stress: No Stress Concern Present (11/27/2022)   [employer]  of Occupational Health - Occupational Stress Questionnaire    Feeling of Stress : Not at all  Social Connections: Unknown (11/27/2022)   Social Connection and Isolation Panel [NHANES]    Frequency of Communication with Friends and Family: Patient declined    Frequency of Social Gatherings with Friends and Family: Patient declined    Attends Religious Services: Patient declined    Database administrator or Organizations: No    Attends Engineer, structural: Not on file    Marital Status: Married  Catering manager Violence: Not At Risk (11/18/2022)   Humiliation, Afraid, Rape, and Kick questionnaire    Fear of Current or Ex-Partner: No    Emotionally Abused: No    Physically Abused: No    Sexually Abused: No      PHYSICAL EXAM  Vitals:   12/27/22 0927  BP: 126/77  Pulse: 63  Weight: 127 lb (57.6 kg)  Height: 5\' 5"  (1.651 m)   Body mass index is 21.13 kg/m.     07/16/2020   11:10 AM 01/20/2020    1:40 PM 10/30/2017    2:33 PM  MMSE - Mini Mental State Exam  Not completed:   Refused  Orientation to time 5 5   Orientation to Place 5 5   Registration 3 3   Attention/ Calculation 5 5   Recall 2 2   Language- name 2 objects 2 2   Language- repeat 1 1   Language- follow 3 step command 3 3   Language- read & follow direction 1 1   Write a sentence 1 1   Copy design 1  1   Total score 29 29       12/27/2022    9:28 AM 11/08/2021    8:54 AM 03/18/2021    7:36 AM  Montreal Cognitive Assessment   Visuospatial/ Executive (0/5) 2 2 2   Naming (0/3) 1 3 2   Attention: Read list of digits (0/2) 1 2 2   Attention: Read list of letters (0/1) 1 1 1   Attention: Serial 7 subtraction starting at 100 (0/3) 0 0 0  Language: Repeat phrase (0/2) 1 1 2   Language : Fluency (0/1) 1 1 1   Abstraction (0/2) 1 1 1   Delayed Recall (0/5) 0 0 0  Orientation (0/6) 5 3 5   Total 13 14 16   Adjusted Score (based on education)  14 16     Generalized: Well developed, in no acute distress    Neurological examination  Mentation: Alert oriented to time, place, history taking. Follows all commands speech and language fluent Cranial nerve II-XII: Pupils were equal round reactive to light. Extraocular movements were full, visual field were full on confrontational test. Facial sensation and strength were normal. Uvula tongue midline. Head turning and shoulder shrug  were normal and symmetric. Motor: The motor testing reveals 5 over 5 strength of all 4 extremities. Good symmetric motor tone is noted throughout.  Sensory: Sensory testing is intact to soft touch on all 4 extremities. No evidence of extinction is noted.  Coordination: Cerebellar testing reveals good finger-nose-finger and heel-to-shin bilaterally.  Gait and station: Gait is normal. Tandem gait is normal. Romberg is negative. No drift is seen.  Reflexes: Deep tendon reflexes are symmetric and normal bilaterally.   DIAGNOSTIC DATA (LABS, IMAGING, TESTING) - I reviewed patient records, labs, notes, testing and imaging myself where available.  Lab Results  Component Value Date   WBC 5.1 11/19/2022   HGB 13.8 11/19/2022   HCT 41.5 11/19/2022   MCV 95.0 11/19/2022   PLT 196 11/19/2022      Component Value Date/Time   NA 138 11/18/2022 2023   K 3.7 11/18/2022 2023   CL 102 11/18/2022 2023   CO2 27 11/18/2022 2023   GLUCOSE 101 (H) 11/18/2022 2023   BUN 18 11/18/2022 2023   CREATININE 0.84 11/19/2022 0335   CALCIUM 10.2 11/18/2022 2023   PROT 6.8 11/18/2022 2023   ALBUMIN 4.2 11/18/2022 2023   AST 23 11/18/2022 2023   ALT 16 11/18/2022 2023   ALKPHOS 45 11/18/2022 2023   BILITOT 0.7 11/18/2022 2023   GFRNONAA >60 11/19/2022 0335   GFRAA 80 (L) 05/22/2013 0941   Lab Results  Component Value Date   CHOL 160 11/19/2022   HDL 62 11/19/2022   LDLCALC 89 11/19/2022   LDLDIRECT 122.2 02/09/2010   TRIG 46 11/19/2022   CHOLHDL 2.6 11/19/2022   Lab Results  Component Value Date   HGBA1C 5.3 11/19/2022   Lab  Results  Component Value Date   VITAMINB12 653 11/19/2022   Lab Results  Component Value Date   TSH 3.832 11/19/2022      ASSESSMENT AND PLAN 82 y.o. year old female  has a past medical history of Allergic rhinitis, cause unspecified (01/29/2013), Anemia (1948?), BACK PAIN, CHRONIC (02/09/2010), Cancer (HCC), Cataract, COLONIC POLYPS, HX OF (02/09/2010), COMMON MIGRAINE (02/09/2010), DEGENERATIVE JOINT DISEASE (02/09/2010), DEPRESSION (02/09/2010), GERD (02/09/2010), HYPERLIPIDEMIA (02/09/2010), HYPERTENSION (02/09/2010), Lumbar disc disease, Memory loss, Neuromuscular disorder (HCC), OCCIPITAL NEURALGIA (06/09/2010), OSTEOPENIA (02/09/2010), Pneumonia, and Varicose vein of leg. here with:  Stroke:  left caudate head small infarct embolic secondary  to small vessel disease source  Memory disturbance    Continue aspirin 81 mg daily  for secondary stroke prevention.  Discussed secondary stroke prevention measures and importance of close PCP follow up for aggressive stroke risk factor management. I have gone over the pathophysiology of stroke, warning signs and symptoms, risk factors and their management in some detail with instructions to go to the closest emergency room for symptoms of concern. HTN: BP goal <130/90.  HLD: LDL goal <70. Recent LDL 89.  DMII: A1c goal<7.0. Recent A1c 5.3.  Encouraged patient to monitor diet and encouraged exercise Memory score is stable MoCA 13/30 previously 14/30 Continue on Aricept and Namenda FU with our office 6 months or sooner if needed.      Butch Penny, MSN, NP-C 12/27/2022, 9:57 AM [facility] [address], [address] [place], [place] [address] [phone]

## 2022-12-27 NOTE — Patient Instructions (Addendum)
Your Plan:  Continue ASA 81 mg daily   Blood pressure goal <130/90 Cholesterol LDL goal <70 Diabetes goal A1c <7 Monitor diet and try to exercise   Memory score is stable- Continue Namenda and Aricept   Thank you for coming to see [place] at [facility]. I hope we have been able to provide you high quality care today.  You may receive a patient satisfaction survey over the next few weeks. We would appreciate your feedback and comments so that we may continue to improve ourselves and the health of our patients.

## 2022-12-30 ENCOUNTER — Telehealth: Payer: Self-pay

## 2022-12-30 NOTE — Patient Outreach (Signed)
  Care Coordination   12/30/2022 Name: DEBANY VANTOL MRN: [id number] DOB: 11/19/1940   Care Coordination Outreach Attempts:  An unsuccessful telephone outreach was attempted for a scheduled appointment today.  Follow Up Plan:  Additional outreach attempts will be made to offer the patient care coordination information and services.   Encounter Outcome:  No Answer   Care Coordination Interventions:  No, not indicated    Kathyrn Sheriff, RN, MSN, BSN, CCM [facility] Care Coordinator [phone]

## 2023-01-09 ENCOUNTER — Telehealth: Payer: Self-pay | Admitting: *Deleted

## 2023-01-09 NOTE — Progress Notes (Unsigned)
  Care Coordination Note  01/09/2023 Name: Crystal Herrera MRN: [id number] DOB: August 18, 1940  Crystal Herrera is a 82 y.o. year old female who is a primary care patient of Corwin Levins, MD and is actively engaged with the care management team. I reached out to Antonieta Loveless by phone today to assist with re-scheduling a follow up visit with the RN Case Manager  Follow up plan: Unsuccessful telephone outreach attempt made. A HIPAA compliant phone message was left for the patient providing contact information and requesting a return call.   Burman Nieves, CCMA Care Coordination Care Guide Direct Dial: [phone]

## 2023-01-11 NOTE — Progress Notes (Signed)
  Care Coordination Note  01/11/2023 Name: Crystal Herrera MRN: [id number] DOB: [birth year]  Crystal Herrera is a 82 y.o. year old female who is a primary care patient of Corwin Levins, MD and is actively engaged with the care management team. I reached out to Antonieta Loveless by phone today to assist with re-scheduling a follow up visit with the RN Case Manager  Follow up plan: Patient declines further follow up and engagement by the care management team. Appropriate care team members and provider have been notified via electronic communication.   Burman Nieves, CCMA Care Coordination Care Guide Direct Dial: [phone]

## 2023-02-07 DIAGNOSIS — Z961 Presence of intraocular lens: Secondary | ICD-10-CM | POA: Diagnosis not present

## 2023-02-07 DIAGNOSIS — H52222 Regular astigmatism, left eye: Secondary | ICD-10-CM | POA: Diagnosis not present

## 2023-02-07 DIAGNOSIS — H524 Presbyopia: Secondary | ICD-10-CM | POA: Diagnosis not present

## 2023-02-15 ENCOUNTER — Telehealth: Payer: Self-pay | Admitting: Internal Medicine

## 2023-02-15 MED ORDER — ATORVASTATIN CALCIUM 40 MG PO TABS: 40.0000 mg | ORAL_TABLET | Freq: Every day | ORAL | 1 refills | Status: DC

## 2023-02-15 NOTE — Telephone Encounter (Signed)
Called pt spoke w/husband he states rthey have been trying to get refill from Dr. Mercer Pod, but haven't pt to continue received response. Looking back in records Dr. Jonny Ruiz states for the Atorvastatin. Inform [facility].../lmb

## 2023-02-15 NOTE — Telephone Encounter (Signed)
Prescription Request  02/15/2023  LOV: 11/28/2022  What is the name of the medication or equipment? Atorvastatin  Have you contacted your pharmacy to request a refill? Yes   Which pharmacy would you like this sent to?  [phone] [place], [place] - [address] [place] [address] Phone: [phone] Fax: [phone]    Patient notified that their request is being sent to the clinical staff for review and that they should receive a response within 2 business days.   Please advise at Mobile [phone] (mobile)

## 2023-05-08 ENCOUNTER — Telehealth: Payer: Self-pay | Admitting: Adult Health

## 2023-05-08 NOTE — Telephone Encounter (Signed)
LVM and sent mychart msg informing pt of appt change- NP out.

## 2023-05-29 ENCOUNTER — Telehealth: Payer: Self-pay | Admitting: Internal Medicine

## 2023-05-29 DIAGNOSIS — Z111 Encounter for screening for respiratory tuberculosis: Secondary | ICD-10-CM

## 2023-05-29 NOTE — Telephone Encounter (Signed)
Dr.John is currently out of the office until tomorrow. If this paperwork is for her husband, this paperwork was given to Dr.John before he went out and hasn't been returned.

## 2023-05-29 NOTE — Telephone Encounter (Signed)
Patient states that they left a 3 page form for joining the senior center - Patient states they need to pick it up by Wednesday - Please call patient and let them know.  [phone]

## 2023-06-01 NOTE — Telephone Encounter (Signed)
Spoke with patient husband, Chanae Gemma, okay per DPR.  Reviewed/Completed paperwork for patient to attend Adult Day Care services.  Patient needs update TB screening, ordered TB Blood work, for patient to complete on 06/02/2023. Husband informed once results are in we will add to form and he can pickup. Patient husband wants to pickup take paperwork to facility.  Agrees with plan, no additional questions at this time.

## 2023-06-02 ENCOUNTER — Other Ambulatory Visit: Payer: Self-pay

## 2023-06-02 ENCOUNTER — Other Ambulatory Visit: Payer: Medicare Other

## 2023-06-02 DIAGNOSIS — Z111 Encounter for screening for respiratory tuberculosis: Secondary | ICD-10-CM

## 2023-06-06 LAB — QUANTIFERON-TB GOLD PLUS
Mitogen-NIL: 7.44 [IU]/mL
NIL: 0.02 [IU]/mL
QuantiFERON-TB Gold Plus: NEGATIVE
TB1-NIL: 0 [IU]/mL
TB2-NIL: 0 [IU]/mL

## 2023-06-07 NOTE — Telephone Encounter (Signed)
Pt would like the form to be Fax to [facility].  Attn to: Ripley Fraise Fax Number [phone]

## 2023-06-08 NOTE — Telephone Encounter (Signed)
Crystal Herrera called back stating she need forms by tomorrow.

## 2023-06-09 NOTE — Telephone Encounter (Signed)
Pt has picked up forms

## 2023-06-12 DIAGNOSIS — Z85828 Personal history of other malignant neoplasm of skin: Secondary | ICD-10-CM | POA: Diagnosis not present

## 2023-06-12 DIAGNOSIS — Z129 Encounter for screening for malignant neoplasm, site unspecified: Secondary | ICD-10-CM | POA: Diagnosis not present

## 2023-06-12 DIAGNOSIS — L821 Other seborrheic keratosis: Secondary | ICD-10-CM | POA: Diagnosis not present

## 2023-06-12 DIAGNOSIS — D1801 Hemangioma of skin and subcutaneous tissue: Secondary | ICD-10-CM | POA: Diagnosis not present

## 2023-06-12 DIAGNOSIS — D0439 Carcinoma in situ of skin of other parts of face: Secondary | ICD-10-CM | POA: Diagnosis not present

## 2023-06-12 DIAGNOSIS — D485 Neoplasm of uncertain behavior of skin: Secondary | ICD-10-CM | POA: Diagnosis not present

## 2023-07-13 ENCOUNTER — Ambulatory Visit: Payer: Medicare Other | Admitting: Adult Health

## 2023-08-01 ENCOUNTER — Ambulatory Visit: Payer: Medicare Other | Admitting: Adult Health

## 2023-08-12 ENCOUNTER — Other Ambulatory Visit: Payer: Self-pay | Admitting: Internal Medicine

## 2023-08-14 ENCOUNTER — Other Ambulatory Visit: Payer: Self-pay

## 2023-08-30 ENCOUNTER — Ambulatory Visit: Payer: Medicare Other | Admitting: Adult Health

## 2023-09-07 NOTE — Telephone Encounter (Signed)
Pt;s husband LVM regarding today's appt at 2 pm. Called back and LVM patient to call back if would like Monday appt at 1 pm

## 2023-09-13 ENCOUNTER — Other Ambulatory Visit: Payer: Self-pay | Admitting: Internal Medicine

## 2023-09-28 ENCOUNTER — Telehealth: Payer: Self-pay

## 2023-09-28 DIAGNOSIS — F02A0 Dementia in other diseases classified elsewhere, mild, without behavioral disturbance, psychotic disturbance, mood disturbance, and anxiety: Secondary | ICD-10-CM

## 2023-09-28 NOTE — Telephone Encounter (Signed)
 Copied from CRM [id number]. Topic: Referral - Request for Referral >> Sep 28, 2023  3:44 PM Fredrich Romans wrote: Did the patient discuss referral with their provider in the last year? Yes (If No - schedule appointment) (If Yes - send message)  Appointment offered? No(husband stated that provider is aware of wife short term memory loss)  Type of order/referral and detailed reason for visit: short term memory-neurology  Preference of office, provider, location: [facility] [phone]  If referral order, have you been seen by this specialty before? No (husband is a patient there) (If Yes, this issue or another issue? When? Where?  Can we respond through MyChart? Yes

## 2023-09-29 NOTE — Telephone Encounter (Signed)
 Ok this is done

## 2023-10-25 ENCOUNTER — Telehealth: Payer: Self-pay | Admitting: Adult Health

## 2023-10-25 DIAGNOSIS — G301 Alzheimer's disease with late onset: Secondary | ICD-10-CM

## 2023-10-25 NOTE — Telephone Encounter (Signed)
 Ok to do referral- transfer of care

## 2023-10-25 NOTE — Telephone Encounter (Signed)
 Pt's spouse is getting throat cancer treatment in [facility]  He is asking if pt can be referred to [facility] for treatment since he is having major appointments in [place], ph and fax to [facility] [place] is ph#[phone] fax#[phone]

## 2023-10-25 NOTE — Telephone Encounter (Signed)
 Transfer of care referral placed. I called the pt's husband and let him know. He was very appreciative and will be on the lookout for a call from [facility] in the next 2 weeks or so. He can follow-up with [place] or [facility] if he hasn't heard in a reasonable amount of time.

## 2023-10-25 NOTE — Telephone Encounter (Signed)
 Referral for neurology fax to [facility]. Phone: [phone], Fax: [phone]

## 2023-10-31 DIAGNOSIS — F03B0 Unspecified dementia, moderate, without behavioral disturbance, psychotic disturbance, mood disturbance, and anxiety: Secondary | ICD-10-CM | POA: Diagnosis not present

## 2023-10-31 DIAGNOSIS — I639 Cerebral infarction, unspecified: Secondary | ICD-10-CM | POA: Diagnosis not present

## 2023-10-31 DIAGNOSIS — G301 Alzheimer's disease with late onset: Secondary | ICD-10-CM | POA: Diagnosis not present

## 2023-10-31 DIAGNOSIS — F02A0 Dementia in other diseases classified elsewhere, mild, without behavioral disturbance, psychotic disturbance, mood disturbance, and anxiety: Secondary | ICD-10-CM | POA: Diagnosis not present

## 2023-10-31 DIAGNOSIS — R9389 Abnormal findings on diagnostic imaging of other specified body structures: Secondary | ICD-10-CM | POA: Diagnosis not present

## 2023-10-31 DIAGNOSIS — R799 Abnormal finding of blood chemistry, unspecified: Secondary | ICD-10-CM | POA: Diagnosis not present

## 2023-11-13 DIAGNOSIS — F03B0 Unspecified dementia, moderate, without behavioral disturbance, psychotic disturbance, mood disturbance, and anxiety: Secondary | ICD-10-CM | POA: Diagnosis not present

## 2023-11-13 DIAGNOSIS — R9389 Abnormal findings on diagnostic imaging of other specified body structures: Secondary | ICD-10-CM | POA: Diagnosis not present

## 2023-11-13 DIAGNOSIS — R799 Abnormal finding of blood chemistry, unspecified: Secondary | ICD-10-CM | POA: Diagnosis not present

## 2023-11-28 ENCOUNTER — Other Ambulatory Visit: Payer: Self-pay

## 2023-11-28 ENCOUNTER — Other Ambulatory Visit: Payer: Self-pay | Admitting: Internal Medicine

## 2023-12-11 DIAGNOSIS — Z86008 Personal history of in-situ neoplasm of other site: Secondary | ICD-10-CM | POA: Diagnosis not present

## 2023-12-11 DIAGNOSIS — L821 Other seborrheic keratosis: Secondary | ICD-10-CM | POA: Diagnosis not present

## 2024-01-11 ENCOUNTER — Other Ambulatory Visit: Payer: Self-pay | Admitting: Neurology

## 2024-01-11 NOTE — Telephone Encounter (Signed)
 Done erx

## 2024-02-02 ENCOUNTER — Other Ambulatory Visit: Payer: Self-pay | Admitting: Internal Medicine

## 2024-03-06 DIAGNOSIS — H903 Sensorineural hearing loss, bilateral: Secondary | ICD-10-CM | POA: Diagnosis not present

## 2024-03-26 DIAGNOSIS — G309 Alzheimer's disease, unspecified: Secondary | ICD-10-CM | POA: Diagnosis not present

## 2024-03-26 DIAGNOSIS — Z79899 Other long term (current) drug therapy: Secondary | ICD-10-CM | POA: Diagnosis not present

## 2024-03-26 DIAGNOSIS — Z7982 Long term (current) use of aspirin: Secondary | ICD-10-CM | POA: Diagnosis not present

## 2024-03-26 DIAGNOSIS — F03B0 Unspecified dementia, moderate, without behavioral disturbance, psychotic disturbance, mood disturbance, and anxiety: Secondary | ICD-10-CM | POA: Diagnosis not present

## 2024-03-26 DIAGNOSIS — F02B0 Dementia in other diseases classified elsewhere, moderate, without behavioral disturbance, psychotic disturbance, mood disturbance, and anxiety: Secondary | ICD-10-CM | POA: Diagnosis not present

## 2024-04-16 ENCOUNTER — Ambulatory Visit (INDEPENDENT_AMBULATORY_CARE_PROVIDER_SITE_OTHER): Admitting: Internal Medicine

## 2024-04-16 ENCOUNTER — Encounter: Payer: Self-pay | Admitting: Internal Medicine

## 2024-04-16 VITALS — BP 106/70 | HR 65 | Temp 97.8°F | Ht 65.0 in | Wt 134.6 lb

## 2024-04-16 DIAGNOSIS — J309 Allergic rhinitis, unspecified: Secondary | ICD-10-CM

## 2024-04-16 DIAGNOSIS — E7800 Pure hypercholesterolemia, unspecified: Secondary | ICD-10-CM

## 2024-04-16 DIAGNOSIS — E559 Vitamin D deficiency, unspecified: Secondary | ICD-10-CM | POA: Diagnosis not present

## 2024-04-16 DIAGNOSIS — F02818 Dementia in other diseases classified elsewhere, unspecified severity, with other behavioral disturbance: Secondary | ICD-10-CM

## 2024-04-16 DIAGNOSIS — Z23 Encounter for immunization: Secondary | ICD-10-CM | POA: Diagnosis not present

## 2024-04-16 DIAGNOSIS — G309 Alzheimer's disease, unspecified: Secondary | ICD-10-CM

## 2024-04-16 NOTE — Patient Instructions (Signed)
 You had the flu shot today  Please continue all other medications as before, and refills have been done if requested.  Please have the pharmacy call with any other refills you may need.  Please continue your efforts at being more active, low cholesterol diet, and weight control.  You are otherwise up to date with prevention measures today.  Please keep your appointments with your specialists as you may have planned  We can hold on lab testing today  Please make an Appointment to return in 6 months, or sooner if needed

## 2024-04-16 NOTE — Progress Notes (Unsigned)
 Patient ID: Crystal Herrera, female   DOB: 1940/11/08, 83 y.o.   MRN: [id number]        Chief Complaint: follow up dementia with behavioral disturbance with hx of ischemic stroke, low vit d, hld,        HPI:  Crystal Herrera is a 83 y.o. female here with husband, overall doing well.  Dementia overall stable symptomatically, and not assoc with behavioral changes such as hallucinations, paranoia, or agitation.  Seeing Neurology in [place].  Pt denies chest pain, increased sob or doe, wheezing, orthopnea, PND, increased LE swelling, palpitations, dizziness or syncope.   Pt denies polydipsia, polyuria, or new focal neuro s/s.   Due for flu shot  Does have several wks ongoing nasal allergy symptoms with clearish congestion, itch and sneezing, without fever, pain, ST, cough, swelling or wheezing.       Wt Readings from Last 3 Encounters:  04/16/24 134 lb 9.6 oz (61.1 kg)  12/27/22 127 lb (57.6 kg)  12/21/22 130 lb 6.4 oz (59.1 kg)   BP Readings from Last 3 Encounters:  04/16/24 106/70  12/27/22 126/77  12/21/22 116/66         Past Medical History:  Diagnosis Date   Allergic rhinitis, cause unspecified 01/29/2013   Anemia 1948?   BACK PAIN, CHRONIC 02/09/2010   Cancer (HCC)    SKIN   Cataract    COLONIC POLYPS, HX OF 02/09/2010   COMMON MIGRAINE 02/09/2010   neuritis in R side of neck & head   DEGENERATIVE JOINT DISEASE 02/09/2010   hands , lumbar stenosis    DEPRESSION 02/09/2010   pt. takes Cymbalta  for pain issues    GERD 02/09/2010   HYPERLIPIDEMIA 02/09/2010   HYPERTENSION 02/09/2010   Lumbar disc disease    Memory loss    Neuromuscular disorder (HCC)    neuritis in head & neck   OCCIPITAL NEURALGIA 06/09/2010   OSTEOPENIA 02/09/2010   Pneumonia    as an infant (05/15/2012)   Varicose vein of leg    BLE   Past Surgical History:  Procedure Laterality Date   BREAST BIOPSY  1988   left; Neg   CHOLECYSTECTOMY  ? 2009   chymopapain injection  1986   Lumbar    COLONOSCOPY     DILATION AND CURETTAGE OF UTERUS  1960's   HERNIA REPAIR  05/15/2012   ventral incisional    LUMBAR FUSION  05/22/2013   L2  L3 decompression /fusion      Dr Beuford   LUMBAR LAMINECTOMY  1986   OVARIAN CYST REMOVAL  2006   Stimulator placed   07/2014   placed 2 years ago for pain   TRANSFORAMINAL LUMBAR INTERBODY FUSION (TLIF) WITH PEDICLE SCREW FIXATION 1 LEVEL Left 05/22/2013   Procedure: TRANSFORAMINAL LUMBAR INTERBODY FUSION (TLIF) WITH PEDICLE SCREW FIXATION 1 LEVEL;  Surgeon: Oneil Rodgers Beuford, MD;  Location: [facility] OR;  Service: Orthopedics;  Laterality: Left;  Left sided lumbar 2-3 Transforaminal lumbar interbody fusion with instrumentation, allograft   TUBAL LIGATION  1970's   VARICOSE VEIN SURGERY  ~ 2008   bilaterally   VENTRAL HERNIA REPAIR  05/15/2012   Procedure: LAPAROSCOPIC VENTRAL HERNIA;  Surgeon: Camellia CHRISTELLA Blush, MD,FACS;  Location: [facility] OR;  Service: General;  Laterality: N/A;  laparoscopic ventral incisional hernia repair with mesh    reports that she quit smoking about 52 years ago. Her smoking use included cigarettes. She started smoking about 62 years ago. She has a 10 pack-year  smoking history. She has never used smokeless tobacco. She reports that she does not drink alcohol and does not use drugs. family history includes Breast cancer in her maternal grandmother; COPD in her mother; Cancer in her maternal grandmother, mother, and son; Colon cancer in her maternal aunt; Diabetes in her father; Lung cancer in her mother; Varicose Veins in her mother. Allergies  Allergen Reactions   Cefuroxime     Might cause hives   Cephalosporins     Might cause hives    Latex Nausea Only   Celexa  [Citalopram  Hydrobromide] Nausea Only   Dexlansoprazole Anxiety    Sleeplessness heart racing headaches chills   Lexapro [Escitalopram Oxalate] Nausea Only   Penicillins Hives   Sulfonamide Derivatives Hives   Vancomycin  Rash   Current Outpatient Medications on File  Prior to Visit  Medication Sig Dispense Refill   acetaminophen  (TYLENOL ) 500 MG tablet Take 500 mg by mouth every 6 (six) hours as needed for mild pain.     aspirin  EC 81 MG tablet Take 1 tablet (81 mg total) by mouth daily. Swallow whole. 30 tablet 12   atorvastatin  (LIPITOR) 40 MG tablet TAKE 1 TABLET BY MOUTH EVERY DAY. Patient to follow up with PCP prior to future refills 90 tablet 0   Biotin 5000 MCG TABS Take 5,000 mcg by mouth daily.     calcium  carbonate (OSCAL) 1500 (600 Ca) MG TABS tablet Take 1,200 mg by mouth daily with breakfast.     Cholecalciferol  (VITAMIN D3 PO) Take 2,000 Units by mouth daily.      donepezil  (ARICEPT ) 10 MG tablet TAKE 1 TABLET BY MOUTH EVERYDAY AT BEDTIME 90 tablet 3   memantine  (NAMENDA ) 10 MG tablet TAKE 1 TABLET BY MOUTH TWICE A DAY 180 tablet 3   OVER THE COUNTER MEDICATION Take 2 tablets by mouth daily. Youthful Brain     vitamin B-12 (CYANOCOBALAMIN ) 500 MCG tablet Take 500 mcg by mouth daily.     No current facility-administered medications on file prior to visit.        ROS:  All others reviewed and negative.  Objective        PE:  BP 106/70   Pulse 65   Temp 97.8 F (36.6 C)   Ht 5' 5 (1.651 m)   Wt 134 lb 9.6 oz (61.1 kg)   SpO2 94%   BMI 22.40 kg/m                 Constitutional: Pt appears in NAD               HENT: Head: NCAT.                Right Ear: External ear normal.                 Left Ear: External ear normal. Bilat tm's with mild erythema.  Max sinus areas non tender.  Pharynx with mild erythema, no exudate               Eyes: . Pupils are equal, round, and reactive to light. Conjunctivae and EOM are normal               Nose: without d/c or deformity               Neck: Neck supple. Gross normal ROM               Cardiovascular: Normal rate and regular rhythm.  Pulmonary/Chest: Effort normal and breath sounds without rales or wheezing.                Abd:  Soft, NT, ND, + BS, no organomegaly                Neurological: Pt is alert. At baseline orientation, motor grossly intact               Skin: Skin is warm. No rashes, no other new lesions, LE edema - none               Psychiatric: Pt behavior is normal without agitation   Micro: none  Cardiac tracings I have personally interpreted today:  none  Pertinent Radiological findings (summarize): none   Lab Results  Component Value Date   WBC 5.1 11/19/2022   HGB 13.8 11/19/2022   HCT 41.5 11/19/2022   PLT 196 11/19/2022   GLUCOSE 101 (H) 11/18/2022   CHOL 160 11/19/2022   TRIG 46 11/19/2022   HDL 62 11/19/2022   LDLDIRECT 122.2 02/09/2010   LDLCALC 89 11/19/2022   ALT 16 11/18/2022   AST 23 11/18/2022   NA 138 11/18/2022   K 3.7 11/18/2022   CL 102 11/18/2022   CREATININE 0.84 11/19/2022   BUN 18 11/18/2022   CO2 27 11/18/2022   TSH 3.832 11/19/2022   INR 1.0 11/18/2022   HGBA1C 5.3 11/19/2022   Assessment/Plan:  Crystal Herrera is a 83 y.o. White or Caucasian [1] female with  has a past medical history of Allergic rhinitis, cause unspecified (01/29/2013), Anemia (1948?), BACK PAIN, CHRONIC (02/09/2010), Cancer (HCC), Cataract, COLONIC POLYPS, HX OF (02/09/2010), COMMON MIGRAINE (02/09/2010), DEGENERATIVE JOINT DISEASE (02/09/2010), DEPRESSION (02/09/2010), GERD (02/09/2010), HYPERLIPIDEMIA (02/09/2010), HYPERTENSION (02/09/2010), Lumbar disc disease, Memory loss, Neuromuscular disorder (HCC), OCCIPITAL NEURALGIA (06/09/2010), OSTEOPENIA (02/09/2010), Pneumonia, and Varicose vein of leg.  Dementia, Alzheimer's, with behavior disturbance (HCC) Dementia with behavior disturbance improved and overall stable, to continue namenda  and aricept , and does not currently other behavior treatment per husband, f/u neurology as planned  Vitamin D  deficiency Last vitamin D  Lab Results  Component Value Date   VD25OH 59.56 11/19/2022   Stable, cont oral replacement   Hyperlipidemia Lab Results  Component Value Date   LDLCALC 89 11/19/2022    uncontrolled pt to continue current statin lipitor 40 mg every day and lower chol diet, declines change today   Allergic rhinitis Mild to mod, for otc nasacort  asd,  to f/u any worsening symptoms or concerns  Followup: Return in about 6 months (around 10/14/2024).  Lynwood Rush, MD 04/17/2024 6:53 PM [facility]

## 2024-04-17 ENCOUNTER — Encounter: Payer: Self-pay | Admitting: Internal Medicine

## 2024-04-17 ENCOUNTER — Telehealth: Payer: Self-pay

## 2024-04-17 NOTE — Assessment & Plan Note (Signed)
 Dementia with behavior disturbance improved and overall stable, to continue namenda  and aricept , and does not currently other behavior treatment per husband, f/u neurology as planned

## 2024-04-17 NOTE — Assessment & Plan Note (Signed)
Mild to mod, for otc nasacort asd, to f/u any worsening symptoms or concerns ?

## 2024-04-17 NOTE — Assessment & Plan Note (Signed)
 Lab Results  Component Value Date   LDLCALC 89 11/19/2022   uncontrolled pt to continue current statin lipitor 40 mg every day and lower chol diet, declines change today

## 2024-04-17 NOTE — Assessment & Plan Note (Signed)
 Last vitamin D  Lab Results  Component Value Date   VD25OH 59.56 11/19/2022   Stable, cont oral replacement

## 2024-04-17 NOTE — Telephone Encounter (Signed)
 Copied from CRM #[id number]. Topic: Clinical - Medical Advice >> Apr 17, 2024  4:15 PM Terri G wrote: Reason for CRM: Core[facility] from [facility] wanted to let Dr.John know that patient was diagnosed with dementia and she is under their care for the time being

## 2024-05-01 ENCOUNTER — Telehealth: Payer: Self-pay

## 2024-05-01 NOTE — Telephone Encounter (Signed)
 Patient paperwork for Adult Day Services completed, and ready for pickup.

## 2024-05-02 ENCOUNTER — Other Ambulatory Visit: Payer: Self-pay

## 2024-05-02 ENCOUNTER — Other Ambulatory Visit: Payer: Self-pay | Admitting: Internal Medicine

## 2024-06-12 DIAGNOSIS — D1801 Hemangioma of skin and subcutaneous tissue: Secondary | ICD-10-CM | POA: Diagnosis not present

## 2024-06-12 DIAGNOSIS — L821 Other seborrheic keratosis: Secondary | ICD-10-CM | POA: Diagnosis not present

## 2024-06-12 DIAGNOSIS — Z85828 Personal history of other malignant neoplasm of skin: Secondary | ICD-10-CM | POA: Diagnosis not present

## 2024-06-12 DIAGNOSIS — Z86008 Personal history of in-situ neoplasm of other site: Secondary | ICD-10-CM | POA: Diagnosis not present

## 2024-06-12 DIAGNOSIS — Z129 Encounter for screening for malignant neoplasm, site unspecified: Secondary | ICD-10-CM | POA: Diagnosis not present

## 2024-06-12 DIAGNOSIS — B351 Tinea unguium: Secondary | ICD-10-CM | POA: Diagnosis not present

## 2024-08-09 ENCOUNTER — Telehealth: Payer: Self-pay | Admitting: Internal Medicine

## 2024-08-09 NOTE — Telephone Encounter (Signed)
 Copied from CRM [id number]. Topic: Appointments - Transfer of Care >> Aug 09, 2024  1:52 PM Crystal Herrera wrote: Pt is requesting to transfer FROM: Dr Crystal Rush MD Pt is requesting to transfer TO: Dr Crystal Herrera Reason for requested transfer: provider leaving  It is the responsibility of the team the patient would like to transfer to Dr Crystal Herrera to reach out to the patient if for any reason this transfer is not acceptable.  Her husband is scheduled with [facility] as new pt, & they are requesting Crystal Herrera to have same day appointment on 10/16/2024 after her husbands visit.

## 2024-10-15 ENCOUNTER — Ambulatory Visit: Admitting: Internal Medicine

## 2024-10-16 ENCOUNTER — Encounter: Admitting: Student
